# Patient Record
Sex: Male | Born: 1945
Health system: Southern US, Community
[De-identification: ages and names within clinical notes are randomized; demographics above are authoritative.]

## PROBLEM LIST (undated history)

## (undated) DIAGNOSIS — K635 Polyp of colon: Secondary | ICD-10-CM

## (undated) DIAGNOSIS — Z972 Presence of dental prosthetic device (complete) (partial): Secondary | ICD-10-CM

## (undated) DIAGNOSIS — E785 Hyperlipidemia, unspecified: Secondary | ICD-10-CM

## (undated) DIAGNOSIS — F32A Depression, unspecified: Secondary | ICD-10-CM

## (undated) DIAGNOSIS — J449 Chronic obstructive pulmonary disease, unspecified: Secondary | ICD-10-CM

## (undated) DIAGNOSIS — Z973 Presence of spectacles and contact lenses: Secondary | ICD-10-CM

## (undated) DIAGNOSIS — I214 Non-ST elevation (NSTEMI) myocardial infarction: Secondary | ICD-10-CM

## (undated) DIAGNOSIS — R51 Headache: Secondary | ICD-10-CM

## (undated) DIAGNOSIS — K08109 Complete loss of teeth, unspecified cause, unspecified class: Secondary | ICD-10-CM

## (undated) DIAGNOSIS — F329 Major depressive disorder, single episode, unspecified: Secondary | ICD-10-CM

## (undated) DIAGNOSIS — I2510 Atherosclerotic heart disease of native coronary artery without angina pectoris: Secondary | ICD-10-CM

## (undated) DIAGNOSIS — I639 Cerebral infarction, unspecified: Secondary | ICD-10-CM

## (undated) HISTORY — DX: Cerebral infarction, unspecified: I63.9

## (undated) HISTORY — DX: Depression, unspecified: F32.A

## (undated) HISTORY — PX: TONSILLECTOMY: SUR1361

## (undated) HISTORY — PX: APPENDECTOMY: SHX54

## (undated) HISTORY — DX: Hyperlipidemia, unspecified: E78.5

## (undated) HISTORY — PX: LUMBAR FUSION: SHX111

## (undated) HISTORY — DX: Chronic obstructive pulmonary disease, unspecified: J44.9

## (undated) HISTORY — PX: NASAL FRACTURE SURGERY: SHX718

## (undated) HISTORY — DX: Major depressive disorder, single episode, unspecified: F32.9

## (undated) HISTORY — PX: RIGHT COLECTOMY: SHX853

## (undated) HISTORY — PX: INGUINAL HERNIA REPAIR: SUR1180

## (undated) HISTORY — DX: Polyp of colon: K63.5

---

## 1984-09-23 HISTORY — PX: BACK SURGERY: SHX140

## 1996-09-23 HISTORY — PX: CORONARY STENT PLACEMENT: SHX1402

## 1998-12-21 ENCOUNTER — Encounter: Payer: Self-pay | Admitting: Emergency Medicine

## 1998-12-21 ENCOUNTER — Emergency Department (HOSPITAL_COMMUNITY): Admission: EM | Admit: 1998-12-21 | Discharge: 1998-12-21 | Payer: Self-pay | Admitting: Unknown Physician Specialty

## 2001-06-04 ENCOUNTER — Encounter: Payer: Self-pay | Admitting: Cardiovascular Disease

## 2001-06-04 ENCOUNTER — Ambulatory Visit (HOSPITAL_COMMUNITY): Admission: RE | Admit: 2001-06-04 | Discharge: 2001-06-04 | Payer: Self-pay | Admitting: Cardiovascular Disease

## 2001-06-11 ENCOUNTER — Encounter: Payer: Self-pay | Admitting: Neurosurgery

## 2001-06-11 ENCOUNTER — Encounter: Admission: RE | Admit: 2001-06-11 | Discharge: 2001-06-11 | Payer: Self-pay | Admitting: Neurosurgery

## 2001-07-13 ENCOUNTER — Encounter: Payer: Self-pay | Admitting: Neurosurgery

## 2001-07-16 ENCOUNTER — Inpatient Hospital Stay (HOSPITAL_COMMUNITY): Admission: RE | Admit: 2001-07-16 | Discharge: 2001-07-21 | Payer: Self-pay | Admitting: Neurosurgery

## 2001-07-16 ENCOUNTER — Encounter: Payer: Self-pay | Admitting: Neurosurgery

## 2001-10-05 ENCOUNTER — Encounter: Admission: RE | Admit: 2001-10-05 | Discharge: 2001-10-05 | Payer: Self-pay | Admitting: Neurosurgery

## 2001-10-05 ENCOUNTER — Encounter: Payer: Self-pay | Admitting: Neurosurgery

## 2002-01-11 ENCOUNTER — Encounter: Admission: RE | Admit: 2002-01-11 | Discharge: 2002-01-11 | Payer: Self-pay | Admitting: Neurosurgery

## 2002-01-11 ENCOUNTER — Encounter: Payer: Self-pay | Admitting: Neurosurgery

## 2002-10-12 ENCOUNTER — Emergency Department (HOSPITAL_COMMUNITY): Admission: EM | Admit: 2002-10-12 | Discharge: 2002-10-12 | Payer: Self-pay

## 2003-01-05 ENCOUNTER — Encounter: Admission: RE | Admit: 2003-01-05 | Discharge: 2003-01-05 | Payer: Self-pay | Admitting: Orthopedic Surgery

## 2003-01-05 ENCOUNTER — Encounter: Payer: Self-pay | Admitting: Orthopedic Surgery

## 2003-01-06 ENCOUNTER — Ambulatory Visit (HOSPITAL_BASED_OUTPATIENT_CLINIC_OR_DEPARTMENT_OTHER): Admission: RE | Admit: 2003-01-06 | Discharge: 2003-01-06 | Payer: Self-pay | Admitting: Orthopedic Surgery

## 2005-02-21 HISTORY — PX: THROMBECTOMY: PRO61

## 2005-02-21 HISTORY — PX: CORONARY STENT PLACEMENT: SHX1402

## 2005-02-28 ENCOUNTER — Inpatient Hospital Stay (HOSPITAL_COMMUNITY): Admission: EM | Admit: 2005-02-28 | Discharge: 2005-03-05 | Payer: Self-pay | Admitting: Emergency Medicine

## 2005-03-20 ENCOUNTER — Inpatient Hospital Stay (HOSPITAL_COMMUNITY): Admission: EM | Admit: 2005-03-20 | Discharge: 2005-03-22 | Payer: Self-pay | Admitting: Emergency Medicine

## 2005-05-21 ENCOUNTER — Ambulatory Visit: Payer: Self-pay | Admitting: Cardiovascular Disease

## 2005-10-26 ENCOUNTER — Emergency Department (HOSPITAL_COMMUNITY): Admission: EM | Admit: 2005-10-26 | Discharge: 2005-10-26 | Payer: Self-pay | Admitting: Emergency Medicine

## 2008-06-09 ENCOUNTER — Encounter: Payer: Self-pay | Admitting: Internal Medicine

## 2008-07-15 ENCOUNTER — Encounter: Admission: RE | Admit: 2008-07-15 | Discharge: 2008-07-15 | Payer: Self-pay | Admitting: Family Medicine

## 2008-07-25 ENCOUNTER — Encounter: Payer: Self-pay | Admitting: Internal Medicine

## 2008-07-25 DIAGNOSIS — Z860100 Personal history of colon polyps, unspecified: Secondary | ICD-10-CM | POA: Insufficient documentation

## 2008-12-30 ENCOUNTER — Encounter: Payer: Self-pay | Admitting: Internal Medicine

## 2008-12-30 LAB — CONVERTED CEMR LAB
ALT: 18 units/L
Creatinine, Ser: 1 mg/dL
Glucose, Bld: 111 mg/dL
Total Bilirubin: 79 mg/dL
Total Protein: 6.2 g/dL

## 2009-01-02 ENCOUNTER — Encounter: Payer: Self-pay | Admitting: Internal Medicine

## 2009-01-02 LAB — CONVERTED CEMR LAB
HCT: 46 %
Hemoglobin: 15.6 g/dL
RBC count: 4.99 10*6/uL
WBC, blood: 6.3 10*3/uL
WBC, blood: 6.3 10*3/uL
platelet count: 237 10*3/uL

## 2009-01-03 ENCOUNTER — Ambulatory Visit (HOSPITAL_COMMUNITY): Admission: RE | Admit: 2009-01-03 | Discharge: 2009-01-03 | Payer: Self-pay | Admitting: Family Medicine

## 2009-01-03 ENCOUNTER — Encounter: Payer: Self-pay | Admitting: Internal Medicine

## 2009-01-11 ENCOUNTER — Encounter: Payer: Self-pay | Admitting: Internal Medicine

## 2009-01-11 ENCOUNTER — Encounter: Admission: RE | Admit: 2009-01-11 | Discharge: 2009-01-11 | Payer: Self-pay | Admitting: Family Medicine

## 2009-01-19 ENCOUNTER — Encounter: Payer: Self-pay | Admitting: Internal Medicine

## 2009-01-24 ENCOUNTER — Encounter: Payer: Self-pay | Admitting: Internal Medicine

## 2009-01-26 ENCOUNTER — Encounter: Payer: Self-pay | Admitting: Internal Medicine

## 2009-07-04 ENCOUNTER — Encounter: Payer: Self-pay | Admitting: Internal Medicine

## 2009-11-01 ENCOUNTER — Encounter (INDEPENDENT_AMBULATORY_CARE_PROVIDER_SITE_OTHER): Payer: Self-pay | Admitting: *Deleted

## 2009-12-05 ENCOUNTER — Ambulatory Visit: Payer: Self-pay | Admitting: Internal Medicine

## 2009-12-05 DIAGNOSIS — F17200 Nicotine dependence, unspecified, uncomplicated: Secondary | ICD-10-CM

## 2009-12-05 DIAGNOSIS — Z8679 Personal history of other diseases of the circulatory system: Secondary | ICD-10-CM | POA: Insufficient documentation

## 2009-12-05 DIAGNOSIS — I252 Old myocardial infarction: Secondary | ICD-10-CM | POA: Insufficient documentation

## 2009-12-05 DIAGNOSIS — F419 Anxiety disorder, unspecified: Secondary | ICD-10-CM | POA: Insufficient documentation

## 2009-12-05 DIAGNOSIS — F329 Major depressive disorder, single episode, unspecified: Secondary | ICD-10-CM

## 2009-12-05 DIAGNOSIS — J449 Chronic obstructive pulmonary disease, unspecified: Secondary | ICD-10-CM

## 2009-12-05 DIAGNOSIS — I2510 Atherosclerotic heart disease of native coronary artery without angina pectoris: Secondary | ICD-10-CM

## 2009-12-05 LAB — HM COLONOSCOPY: HM Colonoscopy: ABNORMAL

## 2009-12-13 ENCOUNTER — Encounter: Payer: Self-pay | Admitting: Internal Medicine

## 2009-12-20 ENCOUNTER — Encounter (INDEPENDENT_AMBULATORY_CARE_PROVIDER_SITE_OTHER): Payer: Self-pay | Admitting: *Deleted

## 2009-12-26 ENCOUNTER — Ambulatory Visit: Payer: Self-pay | Admitting: Internal Medicine

## 2009-12-26 DIAGNOSIS — E785 Hyperlipidemia, unspecified: Secondary | ICD-10-CM | POA: Insufficient documentation

## 2009-12-28 LAB — CONVERTED CEMR LAB
ALT: 23 units/L (ref 0–53)
AST: 24 units/L (ref 0–37)
Calcium: 9 mg/dL (ref 8.4–10.5)
Cholesterol: 104 mg/dL (ref 0–200)
Eosinophils Relative: 4.7 % (ref 0.0–5.0)
Hemoglobin: 15.2 g/dL (ref 13.0–17.0)
LDL Cholesterol: 53 mg/dL (ref 0–99)
Lymphocytes Relative: 33.3 % (ref 12.0–46.0)
Lymphs Abs: 1.3 10*3/uL (ref 0.7–4.0)
Monocytes Relative: 6 % (ref 3.0–12.0)
Neutrophils Relative %: 55.7 % (ref 43.0–77.0)
PSA: 0.95 ng/mL (ref 0.10–4.00)
Platelets: 209 10*3/uL (ref 150.0–400.0)
Potassium: 4.9 meq/L (ref 3.5–5.1)
RBC: 4.65 M/uL (ref 4.22–5.81)
RDW: 13.9 % (ref 11.5–14.6)
Total CHOL/HDL Ratio: 3
Triglycerides: 62 mg/dL (ref 0.0–149.0)
VLDL: 12.4 mg/dL (ref 0.0–40.0)
WBC: 3.9 10*3/uL — ABNORMAL LOW (ref 4.5–10.5)

## 2010-02-08 ENCOUNTER — Encounter (INDEPENDENT_AMBULATORY_CARE_PROVIDER_SITE_OTHER): Payer: Self-pay | Admitting: *Deleted

## 2010-04-23 ENCOUNTER — Ambulatory Visit: Payer: Self-pay | Admitting: Cardiovascular Disease

## 2010-04-23 ENCOUNTER — Inpatient Hospital Stay (HOSPITAL_COMMUNITY): Admission: EM | Admit: 2010-04-23 | Discharge: 2010-04-25 | Payer: Self-pay | Admitting: Emergency Medicine

## 2010-04-23 HISTORY — PX: BALLOON ANGIOPLASTY, ARTERY: SHX564

## 2010-04-24 ENCOUNTER — Encounter: Payer: Self-pay | Admitting: Cardiovascular Disease

## 2010-04-27 ENCOUNTER — Telehealth (INDEPENDENT_AMBULATORY_CARE_PROVIDER_SITE_OTHER): Payer: Self-pay | Admitting: *Deleted

## 2010-05-02 ENCOUNTER — Encounter: Payer: Self-pay | Admitting: Cardiovascular Disease

## 2010-05-07 ENCOUNTER — Ambulatory Visit: Payer: Self-pay | Admitting: Internal Medicine

## 2010-05-07 DIAGNOSIS — M549 Dorsalgia, unspecified: Secondary | ICD-10-CM

## 2010-05-07 DIAGNOSIS — G8929 Other chronic pain: Secondary | ICD-10-CM

## 2010-05-08 ENCOUNTER — Telehealth: Payer: Self-pay | Admitting: Internal Medicine

## 2010-05-08 ENCOUNTER — Ambulatory Visit: Payer: Self-pay | Admitting: Internal Medicine

## 2010-05-09 ENCOUNTER — Ambulatory Visit: Payer: Self-pay | Admitting: Internal Medicine

## 2010-06-05 ENCOUNTER — Ambulatory Visit: Payer: Self-pay | Admitting: Cardiovascular Disease

## 2010-06-05 ENCOUNTER — Telehealth: Payer: Self-pay | Admitting: Internal Medicine

## 2010-06-11 ENCOUNTER — Encounter
Admission: RE | Admit: 2010-06-11 | Discharge: 2010-09-09 | Payer: Self-pay | Source: Home / Self Care | Attending: Physical Medicine & Rehabilitation | Admitting: Physical Medicine & Rehabilitation

## 2010-06-15 ENCOUNTER — Encounter: Payer: Self-pay | Admitting: Internal Medicine

## 2010-06-27 ENCOUNTER — Ambulatory Visit: Payer: Self-pay | Admitting: Internal Medicine

## 2010-07-02 LAB — CONVERTED CEMR LAB: Hgb A1c MFr Bld: 5.9 % (ref 4.6–6.5)

## 2010-07-16 ENCOUNTER — Ambulatory Visit: Payer: Self-pay | Admitting: Physical Medicine & Rehabilitation

## 2010-07-16 ENCOUNTER — Encounter: Payer: Self-pay | Admitting: Cardiovascular Disease

## 2010-07-25 ENCOUNTER — Telehealth: Payer: Self-pay | Admitting: Cardiovascular Disease

## 2010-08-30 ENCOUNTER — Ambulatory Visit: Payer: Self-pay | Admitting: Physical Medicine & Rehabilitation

## 2010-09-26 ENCOUNTER — Ambulatory Visit
Admission: RE | Admit: 2010-09-26 | Discharge: 2010-09-26 | Payer: Self-pay | Source: Home / Self Care | Attending: Internal Medicine | Admitting: Internal Medicine

## 2010-09-27 ENCOUNTER — Encounter
Admission: RE | Admit: 2010-09-27 | Discharge: 2010-10-02 | Payer: Self-pay | Source: Home / Self Care | Attending: Physical Medicine & Rehabilitation | Admitting: Physical Medicine & Rehabilitation

## 2010-10-02 ENCOUNTER — Ambulatory Visit
Admission: RE | Admit: 2010-10-02 | Discharge: 2010-10-02 | Payer: Self-pay | Source: Home / Self Care | Attending: Physical Medicine & Rehabilitation | Admitting: Physical Medicine & Rehabilitation

## 2010-10-17 ENCOUNTER — Telehealth: Payer: Self-pay | Admitting: Internal Medicine

## 2010-10-21 LAB — CONVERTED CEMR LAB
AST: 24 units/L
HDL: 43 mg/dL
LDL Cholesterol: 80 mg/dL
Triglyceride fasting, serum: 130 mg/dL

## 2010-10-23 NOTE — Assessment & Plan Note (Signed)
Summary: 6 month ov//ph   Vital Signs:  Patient profile:   65 year old male Weight:      209.38 pounds Pulse rate:   77 / minute Pulse rhythm:   regular BP sitting:   134 / 86  (left arm) Cuff size:   large  Vitals Entered By: Army Fossa CMA (June 27, 2010 2:14 PM) CC: 6 month f/u Comments Pain in legs, back and neck. See pain clinic on the 24th [facility]   History of Present Illness: ROV feels well saw cards, note reviewed   Current Medications (verified): 1)  Symbicort 160-4.5 Mcg/act Aero (Budesonide-Formoterol Fumarate) .... Daily 2)  Sertraline Hcl 50 Mg Tabs (Sertraline Hcl) .Marland Kitchen.. 1 By Mouth Once Daily 3)  Bayer Aspirin 325 Mg Tabs (Aspirin) .Marland Kitchen.. 1 By Mouth Once Daily 4)  Multivitamins   Tabs (Multiple Vitamin) .Marland Kitchen.. 1 Tab Once Daily 5)  Trazodone Hcl 150 Mg Tabs (Trazodone Hcl) .... 2 By Mouth At Bedtime 6)  Plavix 75 Mg Tabs (Clopidogrel Bisulfate) .Marland Kitchen.. 1 By Mouth Daily. 7)  Metoprolol Tartrate 25 Mg Tabs (Metoprolol Tartrate) .... 1/2 Tab Two Times A Day 8)  Nitrostat 0.4 Mg Subl (Nitroglycerin) .Marland Kitchen.. 1 Tablet Under Tongue At Onset of Chest Pain; You May Repeat Every 5 Minutes For Up To 3 Doses. 9)  Crestor 40 Mg Tabs (Rosuvastatin Calcium) .Marland Kitchen.. 1 By Mouth Daily 10)  Lisinopril 5 Mg Tabs (Lisinopril) .... Take One Tablet By Mouth Daily  Allergies (verified): 1)  ! Tylenol 2)  ! Codeine 3)  ! Vicodin 4)  ! Tetracycline  Past History:  Past Medical History: 1. Depression 2. Cerebrovascular accident, hx of (x2)  one in 2000 and one in 2001.The latter stroke was reportedly due to emboli from a mitral valve vegetation.  3. CAD-stent  placement in the LAD in 1998. February 28, 2005 : acute non ST segment elevation myocardial infarction. He subsequently underwent thrombectomy and stenting of the circumflex. He was discharged on June 13, 200  March 21 2005- readmited, had a  PCI/drug-eluting stent implantation mid-LAD. AMI 8-11, stent with in stent  restenosis Circumflex, sp PTCA balloon angioplasty  4. COPD, mild dz per PFTs  4-10 5. Hyperlipidemia  Past Surgical History: Reviewed history from 05/07/2010 and no changes required. 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 Resp:  Denies cough, coughing up blood, and shortness of breath; no DOE.  Physical Exam  General:  alert and well-developed.   Lungs:  normal respiratory effort, no intercostal retractions, no accessory muscle use, and normal breath sounds.   Heart:  normal rate, regular rhythm, no murmur, and no gallop.   Extremities:  no pretibial edema bilaterally    Impression & Recommendations:  Problem # 1:  HYPERLIPIDEMIA (ICD-272.4) stable per FLP @ the hospital samples   His updated medication list for this problem includes:    Crestor 40 Mg Tabs (Rosuvastatin calcium) .Marland Kitchen... 1 by mouth daily  Problem #  2:  ? of DIABETES MELLITUS (ICD-250.00) A1C 5.7 on  8-11 (@ the hospital) labs  His updated medication list for this problem includes:    Bayer Aspirin 325 Mg Tabs (Aspirin) .Marland Kitchen... 1 by mouth once daily    Lisinopril 5 Mg Tabs (Lisinopril) .Marland Kitchen... Take one tablet by mouth daily  Labs Reviewed: Creat: 1.1 (12/26/2009)    Reviewed HgBA1c results: 5.2 (07/04/2009)  Orders: Venipuncture (30160) TLB-A1C / Hgb A1C (Glycohemoglobin) (83036-A1C) Specimen Handling (10932)  Problem # 3:  TOBACCO ABUSE (ICD-305.1) counsled! risk of MI discussed encouraged to go to the free seminar  at [facility]  Problem # 4:  CORONARY ARTERY DISEASE (ICD-414.00) samples of plavix  His updated medication list for this problem includes:    Bayer Aspirin 325 Mg Tabs (Aspirin) .Marland Kitchen... 1 by mouth once daily    Plavix 75 Mg Tabs (Clopidogrel bisulfate) .Marland Kitchen... 1 by mouth daily.    Metoprolol Tartrate 25 Mg Tabs (Metoprolol tartrate) .Marland Kitchen... 1/2 tab two times a day    Nitrostat 0.4 Mg Subl (Nitroglycerin) .Marland Kitchen... 1 tablet under tongue at onset of chest pain; you may repeat every 5 minutes for up to 3 doses.    Lisinopril 5 Mg Tabs (Lisinopril) .Marland Kitchen... Take one tablet by mouth daily  Complete Medication List: 1)  Symbicort 160-4.5 Mcg/act Aero (Budesonide-formoterol fumarate) .... Daily 2)  Sertraline Hcl 50 Mg Tabs (Sertraline hcl) .Marland Kitchen.. 1 by mouth once daily 3)  Bayer Aspirin 325 Mg Tabs (Aspirin) .Marland Kitchen.. 1 by mouth once daily 4)  Multivitamins Tabs (Multiple vitamin) .Marland Kitchen.. 1 tab once daily 5)  Trazodone Hcl 150 Mg Tabs (Trazodone hcl) .... 2 by mouth at bedtime 6)  Plavix 75 Mg Tabs (Clopidogrel bisulfate) .Marland Kitchen.. 1 by mouth daily. 7)  Metoprolol Tartrate 25 Mg Tabs (Metoprolol tartrate) .... 1/2 tab two times a day 8)  Nitrostat 0.4 Mg Subl (Nitroglycerin) .Marland Kitchen.. 1 tablet under tongue at onset of chest pain; you may repeat every 5 minutes for up to 3 doses. 9)  Crestor 40 Mg Tabs (Rosuvastatin calcium) .Marland Kitchen.. 1 by mouth daily 10)  Lisinopril 5 Mg Tabs (Lisinopril) .... Take one tablet by mouth daily  Other Orders: Flu Vaccine 56yrs + MEDICARE PATIENTS (T5573) Administration Flu vaccine - MCR (U2025)  Patient Instructions: 1)  Please schedule a follow-up appointment in 3  months . Fasting  Flu Vaccine Consent Questions     Do you have a history of severe allergic reactions to this vaccine? no    Any prior history of allergic reactions to egg and/or gelatin? no    Do you have a sensitivity to the preservative Thimersol? no    Do you have a past history of Guillan-Barre Syndrome? no    Do you currently have an  acute febrile illness? no    Have you ever had a severe reaction to latex? no    Vaccine information given and explained to patient? yes    Are you currently pregnant? no    Lot Number:[id number]   Exp Date:03/23/2011   Site Given  Right Deltoid IM     [email or web address]

## 2010-10-23 NOTE — Assessment & Plan Note (Signed)
Summary: NEW TO EST,BCBS/RH......[name]   Vital Signs:  Patient profile:   65 year old male Height:      72 inches Weight:      213.4 pounds BMI:     29.05 Pulse rate:   80 / minute BP sitting:   120 / 80  Vitals Entered By: Shary Decamp (December 05, 2009 1:32 PM) CC: new pt to establish Is Patient Diabetic? No   History of Present Illness: new patient CAD-- has not seen cards in a while, asx  extensive records from the hospital reviewed, see past medical history DEPRESSION-- doing well, on meds     Preventive Screening-Counseling & Management  Alcohol-Tobacco     Smoking Status: current     Packs/Day: 0.5  Caffeine-Diet-Exercise     Caffeine use/day: 0     Does Patient Exercise: yes     Times/week: 3      Drug Use:  no.    Current Medications (verified): 1)  Simvastatin 80 Mg Tabs (Simvastatin) .[name].. 1 By Mouth Once Daily 2)  Symbicort 160-4.5 Mcg/act Aero (Budesonide-Formoterol Fumarate) .... Daily 3)  Sertraline Hcl 50 Mg Tabs (Sertraline Hcl) .[name].. 1 By Mouth Once Daily 4)  Bayer Aspirin 325 Mg Tabs (Aspirin) .[name].. 1 By Mouth Once Daily 5)  Mvi 6)  Trazodone Hcl 150 Mg Tabs (Trazodone Hcl) .[name].. 1 By Mouth At Bedtime  Allergies (verified): 1)  ! Tylenol 2)  ! Codeine 3)  ! Vicodin 4)  ! 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  coronary artery disease stent  placement in the LAD in 1998. February 28, 2005 : acute non ST segment elevation myocardial infarction. He subsequently underwent thrombectomy and stenting of the circumflex. He was discharged on June 13, 200  March 21 2005- readmited, had a  PCI/drug-eluting stent implantation mid-LAD.    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 Resp:  Denies cough, coughing up blood, and shortness of breath; occasionally wheezing, uses symbicort as needed . GI:  Denies diarrhea, nausea, and vomiting. GU:  Denies dysuria and hematuria.  Physical Exam  General:  alert and well-developed.   Lungs:  normal respiratory effort, no intercostal retractions, and no accessory muscle use.  few rhonchi Heart:  normal rate, regular rhythm, no murmur, and no gallop.   Abdomen:  soft, non-tender, and no distention.   Extremities:  no edema Psych:  Oriented X3, memory intact for recent and remote, normally interactive, good eye contact, not anxious appearing, and not depressed appearing.     Impression & Recommendations:  Problem # 1:  COPD (ICD-496) diagnoses is done today based on long history of tobacco abuse, occ. wheezing and a  CT  of the chest in 2006 (" early COPD") counseled  about tobacco   His updated medication list for this problem includes:    Symbicort 160-4.5 Mcg/act Aero (Budesonide-formoterol fumarate) .[name]... Daily  Problem # 2:  TOBACCO ABUSE (ICD-305.1)  counseled  risks of tobacco abuse discussed including strokes and CAD options also discussed: ?medications, printed material provided about seminar to help him quit  Orders: Tobacco use cessation intermediate 3-10 minutes (99406)  Problem # 3:  CORONARY ARTERY DISEASE (ICD-414.00) asx,  has not seen cardiology in a while  His updated medication list for this problem  includes:    Bayer Aspirin 325 Mg Tabs (Aspirin) .[name]... 1 by mouth once daily  Problem # 4:  DEPRESSION (ICD-311) stable at present  His updated medication list for this problem includes:    Sertraline Hcl 50 Mg Tabs (Sertraline hcl) .[name]... 1 by mouth once daily    Trazodone Hcl 150 Mg Tabs (Trazodone hcl) .[name]... 1 by mouth at bedtime  Problem # 5:  time spent today more than 25 minutes due to extensive electronic chart review from the hospital.  Will also get records from his previous PCP.  He had his yearly every April, he will come back in April for his first yearly checkup here  Complete Medication List: 1)  Simvastatin 80 Mg Tabs (Simvastatin) .[name].. 1 by mouth once daily 2)  Symbicort 160-4.5 Mcg/act Aero (Budesonide-formoterol fumarate) .... Daily 3)  Sertraline Hcl 50 Mg Tabs (Sertraline hcl) .[name].. 1 by mouth once daily 4)  Bayer Aspirin 325 Mg Tabs (Aspirin) .[name].. 1 by mouth once daily 5)  Mvi  6)  Trazodone Hcl 150 Mg Tabs (Trazodone hcl) .[name].. 1 by mouth at bedtime  Patient Instructions: 1)  came back in April for your physical   Preventive Care Screening  Colonoscopy:    Date:  10/24/2008    Next Due:  10/2010    Results:  polyps    Last Tetanus Booster:    Date:  09/23/2006    Results:  Td     Risk Factors:  Tobacco use:  current    Cigarettes:  Yes -- 0.5 pack(s) per day Drug use:  no Caffeine use:  0 drinks per day Alcohol use:  no Exercise:  yes    Times per week:  3  Colonoscopy History:     Date of Last Colonoscopy:  10/24/2008    Results:  polyps      Past Medical History:    Depression        Cerebrovascular accident, hx of (x2)  one in 2000 and one in 2001.The latter stroke was reportedly due to emboli from a mitral valve vegetation.        coronary artery disease    stent  placement in the LAD in 1998.    February 28, 2005 : acute non ST segment elevation myocardial infarction. He subsequently underwent thrombectomy and stenting of the circumflex.  He was discharged on June 13, 200     March 21 2005- readmited, had a  PCI/drug-eluting stent implantation mid-LAD.          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

## 2010-10-23 NOTE — Letter (Signed)
Summary: [facility] Medical Clearance   [facility]   Imported By: Roderic Ovens 08/03/2010 11:04:53  _____________________________________________________________________  External Attachment:    Type:   Image     Comment:   External Document

## 2010-10-23 NOTE — Progress Notes (Signed)
Summary: re pain med inj being cxl/**LM/nm  Phone Note Call from Patient   Caller: Patient [phone] Reason for Call: Talk to Nurse Summary of Call: pt calling to ask why dr Sanjuana Kava cxll his pain med inj Initial call taken by: Glynda Jaeger,  July 25, 2010 4:57 PM  Follow-up for Phone Call        Pt. wants to have back injections for his severe back pain & needs to be off of Plavix 1 week prior to the procedure. He was just cathed in August with severe in-stent restenosis and was advised to continue Plavix. Pt. has already stopped taking his Plavix when the injection is not sch. until 11/17 and so I advised him to continue taking it for now. I told him it was highly unlikely that he will be cleared to be off of Plavix and the risk of having another MI. He stated that he doesn't care b/c he is in severe pain and needs the procedure done no matter what..? I will forward to Dr. Clifton James for his opinion. Whitney Maeola Sarah RN  July 25, 2010 5:39 PM  Follow-up by: Whitney Maeola Sarah RN,  July 25, 2010 5:39 PM  Additional Follow-up for Phone Call Additional follow up Details #1::        I would not advise him to stop his Plavix as he had a STEMI three months ago with stent thrombosis. If he chooses to stop the Plavix against medical advise, he can do so with the risk of repeat stent thrombosis. cdm Additional Follow-up by: Verne Carrow, MD,  July 27, 2010 3:04 PM    Additional Follow-up for Phone Call Additional follow up Details #2::    LMTCB. Ollen Gross, RN, BSN  July 27, 2010 3:19 PM   Additional Follow-up for Phone Call Additional follow up Details #3:: Details for Additional Follow-up Action Taken: Can we try to call him back to explain why he should remain on Plavix. thanks, cdm LMTCB Ricardo Red RN I spoke with [name] about the risks involved should he stop the plavix now.  He understands it would be against medical advice.  He said the epidural  injections last about a year.  He will talk with his orthopaedic doctor about alternative treatment. Ricardo Red RN Additional Follow-up by: Verne Carrow, MD,  July 30, 2010 11:36 AM

## 2010-10-23 NOTE — Progress Notes (Signed)
Summary: referral  Phone Note Call from Patient Call back at Home Phone [phone]   Summary of Call: Pt called and would like a call back regarding his referral, states he has not heard anything regarding an appt. Army Fossa CMA  June 05, 2010 12:57 PM   Follow-up for Phone Call        Per referral to [facility] (closest [facility] provider), they Dr. Flo Shanks declined has declined to see the patient per fax, states that the patient need Pain Management.  Will Dr. Drue Novel agree to enter a Pain Mgmt Referral? Magdalen Spatz [facility]  June 06, 2010 9:58 AM  options are -see a local pain management physician -refer him to neurosurgery  at [facility], Kateri Mc, [facility]. Paz MD  June 06, 2010 12:16 PM  Follow-up by:    Additional Follow-up for Phone Call Additional follow up Details #1::        Left message for pt to call back. Army Fossa CMA  June 06, 2010 12:51 PM     Additional Follow-up for Phone Call Additional follow up Details #2::    I spoke with pt and he would prefer not to go to [place], [place], or [place]. Agrees to see Pain Management.  Follow-up by: Army Fossa CMA,  June 07, 2010 11:44 AM

## 2010-10-23 NOTE — Letter (Signed)
Summary: Records Dated 10-04-08 thru 07-04-09/[facility]  Records Dated 10-04-08 thru 07-04-09/[facility]   Imported By: Lanelle Bal 02/05/2010 11:36:40  _____________________________________________________________________  External Attachment:    Type:   Image     Comment:   External Document  Appended Document: Records Dated 10-04-08 thru 07-04-09/[facility] records scanned for future reference

## 2010-10-23 NOTE — Miscellaneous (Signed)
Summary: OLD RECORDS  Clinical Lists Changes  Observations: Added new observation of SGPT (ALT): 27 units/L (07/04/2009 9:39) Added new observation of SGOT (AST): 24 units/L (07/04/2009 9:39) Added new observation of HGBA1C: 5.2 % (07/04/2009 9:39) Added new observation of TRIGLYCERIDE: 130 mg/dL (16/06/9603 5:40) Added new observation of HDL: 43 mg/dL (98/07/9146 8:29) Added new observation of LDL: 80 mg/dL (56/21/3086 5:78) Added new observation of CHOLESTEROL: 130 mg/dL (46/96/2952 8:41) Added new observation of ETTECHOFIND: No exercise induced regional wall motion abnormalities. All cardiac segments show normal hyperkinesia post exercise.   "study is normal.  No evidence of worsening blockage.  Squeezing fxn normal.  Does not appear tha tSOB is coming from heart.  Needs lung eval." [facility], Dr. Amil Amen  (01/24/2009 9:37) Added new observation of EKG INTERP: Sinus bradycardia, looks like inferior MI, no significant change from prior tracing [facility] cardiology, Dr. Amil Amen  (01/19/2009 9:40) Added new observation of ECHOINTERP: Mild MR, focal akinetic basal inferior wall, nl global EF [facility], Dr Amil Amen     (01/19/2009 9:36) Added new observation of RESULTS MISC: Type of Report:  spirometry, mild airflow limitation without significant respose to bronchodilator - FINAL REPORT READ BY DR Delton Coombes  (01/03/2009 9:02) Added new observation of PLATELET CNT: 237 10*3/microliter (01/02/2009 9:09) Added new observation of HCT: 46.0 % (01/02/2009 9:09) Added new observation of HGB: 15.6 g/dL (32/44/0102 7:25) Added new observation of RBC: 4.99  10*6/mm3 (01/02/2009 9:09) Added new observation of WBC: 6.3 10*3/mm3 (01/02/2009 9:09) Added new observation of PLATELET CNT: 237 10*3/microliter (01/02/2009 9:09) Added new observation of HCT: 46.0 % (01/02/2009 9:09) Added new observation of HGB: 15.6 g/dL (36/64/4034 7:42) Added new observation of RBC: 4.99  10*6/mm3 (01/02/2009  9:09) Added new observation of WBC: 6.3 10*3/mm3 (01/02/2009 9:09) Added new observation of PROTEIN, TOT: 6.2 g/dL (59/56/3875 6:43) Added new observation of ALBUMIN: 0.7 g/dL (32/95/1884 1:66) Added new observation of BILI TOTAL: 79 mg/dL (03/22/1600 0:93) Added new observation of ALK PHOS: 79 units/L (12/30/2008 9:08) Added new observation of SGPT (ALT): 18 units/L (12/30/2008 9:08) Added new observation of SGOT (AST): 22 units/L (12/30/2008 9:08) Added new observation of CALCIUM: 10.1 mg/dL (23/55/7322 0:25) Added new observation of BG RANDOM: 111 mg/dL (42/70/6237 6:28) Added new observation of CREATININE: 1.00 mg/dL (31/51/7616 0:73) Added new observation of BUN: 12 mg/dL (71/02/2693 8:54) Added new observation of CO2 TOTAL: 30 mmol/L (12/30/2008 9:08) Added new observation of CHLORIDE: 104 mmol/L (12/30/2008 9:08) Added new observation of SODIUM: 141 mmol/L (12/30/2008 9:08) Added new observation of COLONRECACT: Repeat colonoscopy in 3 years.     (07/25/2008 9:04) Added new observation of COLONOSCOPY: Results: Polyp.   - Three 2-50mm polyps in descending colon  - medium sized lipoma in sigmoid colon  - multiple polyps in recto-sigmoid colon   (07/25/2008 9:04)      Echocardiogram  Procedure date:  01/19/2009  Findings:      Mild MR, focal akinetic basal inferior wall, nl global EF [facility], Dr Amil Amen     Stress Echocardiogram  Procedure date:  01/24/2009  Findings:      No exercise induced regional wall motion abnormalities. All cardiac segments show normal hyperkinesia post exercise.   'study is normal.  No evidence of worsening blockage.  Squeezing fxn normal.  Does not appear tha tSOB is coming from heart.  Needs lung eval.' [facility], Dr. Amil Amen  EKG  Procedure date:  01/19/2009  Findings:      Sinus bradycardia, looks like inferior MI, no significant  change from prior tracing [facility] cardiology, Dr. Amil Amen  MISC.  Report  Procedure date:  01/03/2009  Findings:      Type of Report:  PFT  Breathing test normal.  No lung problem identified.  No reason seen for his SOB with exertion.   Dr. Manus Gunning   Colonoscopy  Procedure date:  07/25/2008  Findings:      Results: Polyp.   - Three 2-57mm polyps in descending colon  - medium sized lipoma in sigmoid colon  - multiple polyps in recto-sigmoid colon   Comments:      Repeat colonoscopy in 3 years.      Echocardiogram  Procedure date:  01/19/2009  Findings:      Mild MR, focal akinetic basal inferior wall, nl global EF [facility], Dr Amil Amen     Stress Echocardiogram  Procedure date:  01/24/2009  Findings:      No exercise induced regional wall motion abnormalities. All cardiac segments show normal hyperkinesia post exercise.   'study is normal.  No evidence of worsening blockage.  Squeezing fxn normal.  Does not appear tha tSOB is coming from heart.  Needs lung eval.' [facility], Dr. Amil Amen  EKG  Procedure date:  01/19/2009  Findings:      Sinus bradycardia, looks like inferior MI, no significant change from prior tracing [facility] cardiology, Dr. Amil Amen  MISC. Report  Procedure date:  01/03/2009  Findings:      Type of Report:  PFT  Breathing test normal.  No lung problem identified.  No reason seen for his SOB with exertion.   Dr. Manus Gunning   Colonoscopy  Procedure date:  07/25/2008  Findings:      Results: Polyp.   - Three 2-75mm polyps in descending colon  - medium sized lipoma in sigmoid colon  - multiple polyps in recto-sigmoid colon   Comments:      Repeat colonoscopy in 3 years.          Lipid Panel Test Date: 07/04/2009                        Value        Units        H/L   Reference  Cholesterol:          130          mg/dL              (161-096) LDL Cholesterol:      80           mg/dL              (04-540) HDL Cholesterol:      43           mg/dL               (98-11) Triglyceride:         130          mg/dL              (91-478) GNFA2Z                5.2                             SGOT (AST)            24  SGPT (ALT)            27                                   Chemistry Labs Test Date: 12/30/2008                      Value Units        H/L   Reference  Sodium:             141   mmol/L             (137-145) Chloride:           104   mmol/L             (101-111) CO2:                30    mmol/L             (22-31) BUN:                12    mg/dL              (1-61) Creatinine:         1.00  mg/dL              (0.9-6.0) Glucose-random:     111   mg/dL         H    (45-409) Calcium (total):    10.1  mg/dL              (8-11.9) SGOT:               22    U/L                (10-40) SGPT:               18    U/L                (10-40) Alkaline P'tase:    79    U/L                (10-120) T. Bili:            79    mg/dL         H    (1.4-7.8) Albumin:            0.7   g/dL          L    (3-5) Total Protein:      6.2   g/dL               (4-7)    Complete Blood Count Test Date: 01/02/2009             Value   Units      H/L    Reference  WBC:       6.3   X 10^3/uL          (3.5-10.0) RBC:       4.99  X 10^6/uL          (4.20-5.80) Hgb:       15.6  g/dl               (29.5-62.1) Hct:       46.0  %                  (  38.5-52.0) Platelets: 237   X 10^3/uL          (150-450)

## 2010-10-23 NOTE — Assessment & Plan Note (Signed)
Summary: HOSPITAL FOLLOWUP/PER DR. PAZ//KN   Vital Signs:  Patient profile:   65 year old male Weight:      211.38 pounds Pulse rate:   67 / minute Pulse rhythm:   regular BP sitting:   132 / 84  (left arm) Cuff size:   regular  Vitals Entered By: Army Fossa CMA (May 07, 2010 3:46 PM) CC: Hospital f/u   History of Present Illness: Hospital followup Chart reviewed DATE OF ADMISSION:  04/23/2010 DATE OF DISCHARGE:  04/25/2010  DISCHARGE DIAGNOSES: 1. Acute inferolateral ST-elevation myocardial infarction.     a.     Known history of coronary artery disease with previous      percutaneous coronary intervention with drug eluting stent to the      left anterior descending and circumflex, 2006.     b.     Acute inferolateral ST-elevation myocardial infarction with  subsequent cardiac catheterization, April 23, 2010:  Triple-vessel  coronary artery disease, severe in-stent restenosis in the drug      eluting stent of proximal to mid circumflex artery, 95% obstruction.  Segmental left ventricular systolic dysfunction, inferior wall hypokinesis and left ventricular ejection fraction 35-40%.     c.     Successful percutaneous transluminal coronary angioplasty  with balloon only in the area of severe in-stent restenosis of the   circumflex stent.     d.     2-D echocardiogram, April 24, 2010 revealing normal left  ventricle with normal systolic function, left ventricular ejection  fraction 50-55%,      e.     P2Y12% inhibition equal to 38, PRU 154, lifelong DAPT with  full strength aspirin and Plavix 75 mg p.o. daily.   . 2. Ongoing tobacco abuse.     a.     Planned cessation, follow up with primary care Derrel Moore.     Current Medications (verified): 1)  Symbicort 160-4.5 Mcg/act Aero (Budesonide-Formoterol Fumarate) .... Daily 2)  Sertraline Hcl 50 Mg Tabs (Sertraline Hcl) .Marland Kitchen.. 1 By Mouth Once Daily 3)  Bayer Aspirin 325 Mg Tabs (Aspirin) .Marland Kitchen.. 1 By Mouth Once Daily 4)  Mvi 5)   Trazodone Hcl 150 Mg Tabs (Trazodone Hcl) .... 2 By Mouth At Bedtime 6)  Plavix 75 Mg Tabs (Clopidogrel Bisulfate) .Marland Kitchen.. 1 By Mouth Daily. 7)  Metoprolol Tartrate 25 Mg Tabs (Metoprolol Tartrate) .... 1/2 Tab Two Times A Day 8)  Nitrostat 0.4 Mg Subl (Nitroglycerin) .... As Needed 9)  Crestor 40 Mg Tabs (Rosuvastatin Calcium) .Marland Kitchen.. 1 By Mouth Daily  Allergies: 1)  ! Tylenol 2)  ! Codeine 3)  ! Vicodin 4)  ! 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  coronary artery disease stent  placement in the LAD in 1998. February 28, 2005 : acute non ST segment elevation myocardial infarction. He subsequently underwent thrombectomy and stenting of the circumflex. He was discharged on June 13, 200  March 21 2005- readmited, had a  PCI/drug-eluting stent implantation mid-LAD. 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 Single (his girlfrien is Mrs.  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 CV:  denies chest pain or shortness of breath. Resp:  no cough or wheezing has discontinued tobacco since his heart attack, using over the counter  nicotine. Feels like he will be successful this time stating away from tobacco. GI:  no nausea, vomiting, diarrhea No blood in the stools. GU:  no dysuria or gross hematuria. MS:  complaining of   back pain for the last 3 months. Pain is worse by moving his torso, it radiates down to the left buttock and left leg, posterior aspect, all the way to the foot No fever No bladder or bowel incontinence Occasional tingling in the left leg.Marland Kitchen  Physical Exam  General:  alert, well-developed, and well-nourished.   Lungs:  normal respiratory effort, no intercostal retractions, no accessory muscle use, and normal breath sounds.   Heart:  normal rate, regular rhythm, no murmur, and no gallop.   Msk:  well healed surgical scar in the distal thoracic back and lumbar back. Slightly tender at the distal thoracic back Extremities:  no lower extremity edema Neurologic:  gait is moderately antalgic Lower extremities with normal reflexes and motor strength straight leg test is negative Psych:  not anxious appearing and not depressed appearing.     Impression & Recommendations:  Problem # 1:  CORONARY ARTERY DISEASE (ICD-414.00) status post acute MI, now asymptomatic He will remain on aspirin and Plavix for life per  discharge summary dictation His updated medication list for this problem includes:    Bayer Aspirin 325 Mg Tabs (Aspirin) .Marland Kitchen... 1 by mouth once daily    Plavix 75 Mg Tabs (Clopidogrel bisulfate) .Marland Kitchen... 1 by mouth daily.    Metoprolol Tartrate 25 Mg Tabs (Metoprolol tartrate) .Marland Kitchen... 1/2 tab two times a day    Nitrostat 0.4 Mg Subl (Nitroglycerin) .Marland Kitchen... As needed  Problem # 2:  HYPERLIPIDEMIA (ICD-272.4) well controlled Labs at the hospital as follows: Cholesterol 98, triglycerides 115, HDL 35, LDL 40 he is now on Crestor The following medications were removed from the medication list:    Simvastatin 80 Mg Tabs (Simvastatin) .Marland Kitchen... 1 by mouth once daily His updated medication list for this  problem includes:    Crestor 40 Mg Tabs (Rosuvastatin calcium) .Marland Kitchen... 1 by mouth daily  Problem # 3:  TOBACCO ABUSE (ICD-305.1) quit tobacco since the acute heart attack He thinks he is doing very well with over-the-counter nicotine supplements. Advised to call me immediately if problems. Discussed with him the need to stay away from tobacco to preserve his heart  Problem # 4:  BACK PAIN (ICD-724.5) back pain with radiculopathy for 3 months, history of multiple surgeries by [name] in the past. Plan X-ray Refer to [name] again His updated medication list for this problem includes:    Bayer Aspirin 325 Mg Tabs (Aspirin) .Marland Kitchen... 1 by mouth once daily  Orders: T-Lumbar Spine Complete, 5 Views [phone]) T-Thoracic Spine 2 Views [phone]) Misc. Referral (Misc. Ref)  Complete Medication List: 1)  Symbicort 160-4.5 Mcg/act Aero (Budesonide-formoterol fumarate) .... Daily 2)  Sertraline Hcl 50 Mg Tabs (Sertraline  hcl) .... 1 by mouth once daily 3)  Bayer Aspirin 325 Mg Tabs (Aspirin) .Marland Kitchen.. 1 by mouth once daily 4)  Mvi  5)  Trazodone Hcl 150 Mg Tabs (Trazodone hcl) .... 2 by mouth at bedtime 6)  Plavix 75 Mg Tabs (Clopidogrel bisulfate) .Marland Kitchen.. 1 by mouth daily. 7)  Metoprolol Tartrate 25 Mg Tabs (Metoprolol tartrate) .... 1/2 tab two times a day 8)  Nitrostat 0.4 Mg Subl (Nitroglycerin) .... As needed 9)  Crestor 40 Mg Tabs (Rosuvastatin calcium) .Marland Kitchen.. 1 by mouth daily  Patient Instructions: 1)  Please schedule a follow-up appointment in 3 months .

## 2010-10-23 NOTE — Miscellaneous (Signed)
Summary: [facility]iac Progress Note   [facility]   Imported By: Roderic Ovens 05/21/2010 12:48:22  _____________________________________________________________________  External Attachment:    Type:   Image     Comment:   External Document

## 2010-10-23 NOTE — Progress Notes (Signed)
Summary: NEUROSURGERY REFERRAL  Phone Note Outgoing Call Call back at [facility] Phone [phone]   Call placed by: Magdalen Spatz [facility],  May 08, 2010 4:31 PM Call placed to: Patient Summary of Call: IN REFERENCE TO NEUROSURGERY REFERRAL, THE PATIENT NOW HAS BLUE MEDICARE INSURANCE.  HIS PREVIOUS SURGEON DR. HIRSCH'S OFFICE DOESN'T ACCEPT IT.  THE ONLY OTHER LOCAL NEUROSURGEON IS [facility] [place].  THE PATIENT STATES HE HAS BEEN THERE BEFORE & DIDN'T LIKE THEM, SAYS THEY STATE NOTHING IS WRONG WITH HIM.  I THEN INFORMED PATIENT THE ONLY OTHER PROVIDERS TO REFER HIM TO WERE OUT OF TOWN & PATIENT STATED HE WILL NOT GO OUT OF TOWN.  PLEASE ADVISE. Initial call taken by: Magdalen Spatz [facility],  May 08, 2010 4:31 PM  Follow-up for Phone Call        my advise is: see Dr Phoebe Perch and pay out of network or  accept to see somebody out of town [facility]?) [place] E. Paz MD  May 09, 2010 9:19 AM   Additional Follow-up for Phone Call Additional follow up Details #1::        ([facility] IS NOT IN-NETWORK W/PT'S INSURANCE, CLOSEST IN-NETWORK IS [facility] [place].  PT CHOOSES TO GO HERE INSTEAD OF PAYING OUT OF NETWORK BENEFITS W/[facility]).  COMPLETED FORM & ALL INFO FAXED TO [facility], THEY WILL CONTACT PT, AWAITING APPT  Additional Follow-up by: Magdalen Spatz [facility]  May 10, 2010 2:35 PM

## 2010-10-23 NOTE — Letter (Signed)
Summary: Cardiac Rehab Program  Cardiac Rehab Program   Imported By: Marylou Mccoy 05/14/2010 15:40:27  _____________________________________________________________________  External Attachment:    Type:   Image     Comment:   External Document

## 2010-10-23 NOTE — Letter (Signed)
Summary: New Patient Letter  [facility] [place]  [address] [place], [place] [address]   Phone: [phone]  Fax: [phone]       11/01/2009 MRN: [id number]  [facility] [address] [place], [place]  [address]  Dear Mr. LIMBAUGH,   Welcome to [facility] and thank you for choosing [place] as your Primary Care Providers. Enclosed you will find information about our practice that we hope you find helpful. We have also enclosed forms to be filled out prior to your visit. This will provide [place] with the necessary information and facilitate your being seen in a timely manner. If you have any questions, please call us at: [phone] and we will be happy to assist you. We look forward to seeing you at your scheduled appointment time.  Appointment Tuesday, December 05, 2009 at 1:40pm  with Dr. Nolon Rod. Paz  Sincerely,  Primary Health Care Team  Please arrive 15 minutes early for your first appointment and bring your insurance card. Co-pay is required at the time of your visit.  *****Please call the office if you are not able to keep this appointment. There is a charge of $50.00 if any appointment is not cancelled or rescheduled within 24 hours*****

## 2010-10-23 NOTE — Miscellaneous (Signed)
Summary: Td 2007   Vaccine Record/[facility]  Vaccine Record/[facility]   Imported By: Lanelle Bal 02/05/2010 11:32:03  _____________________________________________________________________  External Attachment:    Type:   Image     Comment:   External Document  Appended Document: Td 2007   Vaccine Record/[facility] he is entered the Td in the system

## 2010-10-23 NOTE — Letter (Signed)
Summary: [facility]  [facility]   Imported By: Lanelle Bal 12/18/2009 09:21:07  _____________________________________________________________________  External Attachment:    Type:   Image     Comment:   External Document

## 2010-10-23 NOTE — Letter (Signed)
Summary: Eval Rescheduled/[facility]  Eval Rescheduled/[facility]   Imported By: Lanelle Bal 06/25/2010 12:40:39  _____________________________________________________________________  External Attachment:    Type:   Image     Comment:   External Document

## 2010-10-23 NOTE — Assessment & Plan Note (Signed)
Summary: cpx/kdc   Vital Signs:  Patient profile:   65 year old male Height:      72 inches Weight:      209 pounds O2 Sat:      88 % Pulse rate:   59 / minute BP sitting:   110 / 80  Vitals Entered By: Shary Decamp (December 26, 2009 12:47 PM) CC: cpx Comments  - pt has not had cig since last ov here!!!! Shary Decamp  December 26, 2009 12:51 PM    History of Present Illness: Depression-- symptoms well controlled  Cerebrovascular accident--asymptomatic,   no sequela  coronary artery disease-- records reviewed, doing well  COPD, mild dz per PFTs  4-10 essentially asymptomatic, quit tobacco last month  yearly checkup    Preventive Screening-Counseling & Management  Alcohol-Tobacco     Smoking Status: quit     Year Quit: 12/05/2009  Current Medications (verified): 1)  Simvastatin 80 Mg Tabs (Simvastatin) .Marland Kitchen.. 1 By Mouth Once Daily 2)  Symbicort 160-4.5 Mcg/act Aero (Budesonide-Formoterol Fumarate) .... Daily 3)  Sertraline Hcl 50 Mg Tabs (Sertraline Hcl) .Marland Kitchen.. 1 By Mouth Once Daily 4)  Bayer Aspirin 325 Mg Tabs (Aspirin) .Marland Kitchen.. 1 By Mouth Once Daily 5)  Mvi 6)  Trazodone Hcl 150 Mg Tabs (Trazodone Hcl) .... 2 By Mouth At Bedtime  Allergies (verified): 1)  ! Tylenol 2)  ! Codeine 3)  ! Vicodin 4)  ! 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  coronary artery disease stent  placement in the LAD in 1998. February 28, 2005 : acute non ST segment elevation myocardial infarction. He subsequently underwent thrombectomy and stenting of the circumflex. He was discharged on June 13, 200  March 21 2005- readmited, had a  PCI/drug-eluting stent implantation mid-LAD.    COPD, mild dz per PFTs  4-10 hyperlipidemia  Past Surgical History: Reviewed history from 12/05/2009 and no changes  required. 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 M - deceased MI F - deceased - lung Ca +smoker GM - DM aunt - DM colon ca-- no prostate ca--no  Social History: Single (his girlfrien is Mrs.  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 Resp:  Denies cough, coughing up blood, and sputum productive. GI:  Denies bloody stools, diarrhea, nausea, and vomiting. GU:  Denies dysuria, hematuria, and urinary hesitancy. Psych:  Denies anxiety and depression.  Physical Exam  General:  alert, well-developed, and well-nourished.   Neck:  no masses and normal carotid upstroke.   Lungs:  normal respiratory effort, no intercostal retractions, no accessory muscle use, and normal breath sounds.   Heart:  normal rate, regular rhythm, no murmur, and no gallop.   Abdomen:  soft, non-tender, normal bowel sounds, no distention, no masses, no guarding, and no rigidity.   Rectal:  No external abnormalities noted. Normal sphincter tone. No rectal masses or tenderness. brown stools Prostate:  Prostate gland firm and smooth, no enlargement, nodularity, tenderness, mass, asymmetry or induration. Extremities:  no lower extremity edema Neurologic:  alert & oriented X3, strength normal in all extremities, and gait normal.   Psych:  Cognition and judgment appear intact. Alert and cooperative with normal attention span and concentration.  not anxious appearing and not depressed appearing.     Impression & Recommendations:  Problem # 1:  HYPERLIPIDEMIA  (ICD-272.4) labs  His updated medication list for this problem includes:    Simvastatin 80 Mg Tabs (Simvastatin) .Marland Kitchen... 1 by mouth once daily  Labs Reviewed: SGOT: 24 (07/04/2009)   SGPT: 27 (07/04/2009)   HDL:43 (07/04/2009)  LDL:80 (07/04/2009)  Chol:130 (07/04/2009)  Trig:130 (07/04/2009)  Orders: TLB-TSH (Thyroid Stimulating Hormone) (84443-TSH) TLB-Lipid Panel (80061-LIPID) TLB-ALT (SGPT) (84460-ALT) TLB-AST (SGOT) (84450-SGOT)  Problem # 2:  ROUTINE GENERAL MEDICAL EXAM@HEALTH  CARE FACL (ICD-V70.0) Td 2008 never had a pneumonia shot, got  one today gets flu shot yearly cscope 11-09, hyperplastic polyps  Dr Randa Evens, next Cscope per GI check a PSA sees dentist routinely  recommend to continue being active and eat healthy  Problem # 3:  TOBACCO ABUSE (ICD-305.1) quit tobacco 2/11, praised   Problem # 4:  COPD (ICD-496) mild per PFTs, asymptomatic  at present His updated medication list for this problem includes:    Symbicort 160-4.5 Mcg/act Aero (Budesonide-formoterol fumarate) .Marland Kitchen... Daily  Problem # 5:  CORONARY ARTERY DISEASE (ICD-414.00) asymptomatic His updated medication list for this problem includes:    Bayer Aspirin 325 Mg Tabs (Aspirin) .Marland Kitchen... 1 by mouth once daily    Labs Reviewed: Chol: 130 (07/04/2009)   HDL: 43 (07/04/2009)   LDL: 80 (07/04/2009)   TG: 130 (07/04/2009)  Orders: TLB-BMP (Basic Metabolic Panel-BMET) (80048-METABOL) TLB-CBC Platelet - w/Differential (85025-CBCD)  Problem # 6:  DEPRESSION (ICD-311) symptoms well controlled His updated medication list for this problem includes:    Sertraline Hcl 50 Mg Tabs (Sertraline hcl) .Marland Kitchen... 1 by mouth once daily    Trazodone Hcl 150 Mg Tabs (Trazodone hcl) .Marland Kitchen... 2 by mouth at bedtime  Complete Medication List: 1)  Simvastatin 80 Mg Tabs (Simvastatin) .Marland Kitchen.. 1 by mouth once daily 2)  Symbicort 160-4.5 Mcg/act Aero (Budesonide-formoterol fumarate) .... Daily 3)  Sertraline Hcl 50 Mg Tabs (Sertraline  hcl) .Marland Kitchen.. 1 by mouth once daily 4)  Bayer Aspirin 325 Mg Tabs (Aspirin) .Marland Kitchen.. 1 by mouth once daily 5)  Mvi  6)  Trazodone Hcl 150 Mg Tabs (Trazodone hcl) .... 2 by mouth at bedtime  Other Orders: Venipuncture (84166) TLB-PSA (Prostate Specific Antigen) (84153-PSA) Pneumococcal Vaccine (06301) Admin 1st Vaccine (60109)  Patient Instructions: 1)  Please schedule a follow-up appointment in 6 months .  Prescriptions: TRAZODONE HCL 150 MG TABS (TRAZODONE HCL) 2 by mouth at bedtime  #180 x 3   Entered and Authorized by:   Elita Quick E. Geraldo Haris MD   Signed by:   Nolon Rod. Jasiel Belisle MD on 12/26/2009   Method used:   Print then Give to Patient   RxID:   [id number] SIMVASTATIN 80 MG TABS (SIMVASTATIN) 1 by mouth once daily  #90 x 3   Entered and Authorized by:   Nolon Rod. Rendell Thivierge MD   Signed by:   Nolon Rod. Calel Pisarski MD on 12/26/2009   Method used:   Print then Give to Patient   RxID:   [id number]    Preventive Care Screening  Prior Values:    Colonoscopy:  polyps  (10/24/2008)    Last Tetanus Booster:  Td (09/23/2006)    Risk Factors:  Tobacco use:  quit    Year quit:  12/05/2009 Drug use:  no Caffeine use:  0 drinks per day Alcohol use:  no Exercise:  yes    Times per week:  3  Colonoscopy History:    Date of Last Colonoscopy:  10/24/2008    Immunizations Administered:  Pneumonia Vaccine:    Vaccine Type: Pneumovax    Site: right deltoid    Mfr: Merck    Dose: 0.5 ml    Route: IM    Given by: Shary Decamp    Exp. Date: 01/03/2011    Lot #: [id number]
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
## 2010-10-23 NOTE — Letter (Signed)
Summary: [facility] @ [facility] [place]   Imported By: Lanelle Bal 12/18/2009 09:24:14  _____________________________________________________________________  External Attachment:    Type:   Image     Comment:   External Document

## 2010-10-23 NOTE — Procedures (Signed)
Summary: Colonoscopy/[facility]  Colonoscopy/[facility]   Imported By: Lanelle Bal 12/26/2009 10:20:04  _____________________________________________________________________  External Attachment:    Type:   Image     Comment:   External Document

## 2010-10-23 NOTE — Miscellaneous (Signed)
Summary: Td  Clinical Lists Changes  Observations: Removed observation of TD BOOSTER: Td (09/23/2006 13:34) Added new observation of TD BOOSTER: Historical (09/23/2005 13:18)      Immunization History:  Tetanus/Td Immunization History:    Tetanus/Td:  historical (09/23/2005)

## 2010-10-23 NOTE — Progress Notes (Signed)
Summary: appt  Phone Note Outgoing Call   Summary of Call: Pt needs a hosptial f/u in 1- 2 weeks please schedule. Army Fossa CMA  April 27, 2010 7:48 AM   Follow-up for Phone Call        PT HAS AN APPT [facility] 8/15 @ 3:40 FOR HOSPITAL FOLLOWUP.Marland KitchenKalyn Negrete  April 27, 2010 10:44 AM

## 2010-10-23 NOTE — Letter (Signed)
Summary: Primary Care Appointment Letter  [facility] [place]  [address] [place], [place] [address]   Phone: [phone]  Fax: [phone]    12/20/2009 MRN: [id number]  [facility] [address] [place], [place]  [address]  Dear Mr. JOSLIN,   Your Primary Care Physician Lincolnton E. Paz MD has indicated that:    _______it is time to schedule an appointment.    _______you missed your appointment on______ and need to call and          reschedule.    _______you need to have lab work done.    _______you need to schedule an appointment discuss lab or test results.    ____X___you need to call to reschedule your appointment that is                       scheduled on _APRIL 20,2011 2:00PM________.     Please call our office as soon as possible. Our phone number is [phone]_______. Please press option 1. Our office is open 8a-12noon and 1p-5p, Monday through Friday.     Thank you,    [facility] [facility] Scheduler

## 2010-10-23 NOTE — Letter (Signed)
Summary: [facility]  [facility]   Imported By: Lanelle Bal 12/18/2009 09:22:06  _____________________________________________________________________  External Attachment:    Type:   Image     Comment:   External Document

## 2010-10-25 NOTE — Assessment & Plan Note (Signed)
Summary: 3 month followup--he will be fasting//sph   Vital Signs:  Patient profile:   64 year old male Weight:      207 pounds Pulse rate:   84 / minute Pulse rhythm:   regular BP sitting:   148 / 88  (left arm) Cuff size:   large  Vitals Entered By: Army Fossa CMA (September 26, 2010 1:53 PM) CC: 3 month f/u- fasting  Comments [facility] [place]    History of Present Illness: ROV back pain-- unable to get a local shot b/c "cardiology would not stop Plavix" however at the same time he states that he cannot afford Plavix, metoprolol, crestor  or lisinopril and has not been taking them for a while.       Current Medications (verified): 1)  Symbicort 160-4.5 Mcg/act Aero (Budesonide-Formoterol Fumarate) .... Daily 2)  Sertraline Hcl 50 Mg Tabs (Sertraline Hcl) .Marland Kitchen.. 1 By Mouth Once Daily 3)  Bayer Aspirin 325 Mg Tabs (Aspirin) .Marland Kitchen.. 1 By Mouth Once Daily 4)  Multivitamins   Tabs (Multiple Vitamin) .Marland Kitchen.. 1 Tab Once Daily 5)  Trazodone Hcl 150 Mg Tabs (Trazodone Hcl) .... 2 By Mouth At Bedtime 6)  Nitrostat 0.4 Mg Subl (Nitroglycerin) .Marland Kitchen.. 1 Tablet Under Tongue At Onset of Chest Pain; You May Repeat Every 5 Minutes For Up To 3 Doses. 7)  Crestor 40 Mg Tabs (Rosuvastatin Calcium) .Marland Kitchen.. 1 By Mouth Daily  Allergies (verified): 1)  ! Tylenol 2)  ! Codeine 3)  ! Vicodin 4)  ! Tetracycline   Past History:  Past Medical History: Reviewed history from 06/27/2010 and no changes required. 1. Depression 2. Cerebrovascular accident, hx of (x2)  one in 2000 and one in 2001.The latter stroke was reportedly due to emboli from a mitral valve vegetation.  3. CAD-stent  placement in the LAD in 1998. February 28, 2005 : acute non ST segment elevation myocardial infarction. He subsequently underwent thrombectomy and stenting of the circumflex. He was discharged on June 13, 200  March 21 2005- readmited, had a  PCI/drug-eluting stent implantation mid-LAD. AMI 8-11, stent with in stent restenosis  Circumflex, sp PTCA balloon angioplasty  4. COPD, mild dz per PFTs  4-10 5. Hyperlipidemia  Past Surgical History: Reviewed history from 05/07/2010 and no changes required. 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 Resp:  Denies cough and shortness of breath.  Physical Exam  General:  alert and well-developed.   Lungs:  normal respiratory effort, no intercostal retractions, no accessory muscle use, and normal breath sounds.   Heart:  normal rate, regular rhythm, no murmur, and no gallop.   Extremities:  no pretibial edema bilaterally    Impression & Recommendations:  Problem # 1:  ? of DIABETES MELLITUS (ICD-250.00) borderline diabetes with a hemoglobin A1c of 5.9 on a diet only His updated medication list for this problem includes:    Bayer Aspirin 325 Mg Tabs (Aspirin) .Marland Kitchen... 1 by mouth once daily    Lisinopril 5 Mg Tabs (Lisinopril) .Marland Kitchen... Take one  tablet by mouth daily  Labs Reviewed: Creat: 1.1 (12/26/2009)    Reviewed HgBA1c results: 5.9 (06/27/2010)  5.2 (07/04/2009)  Problem # 2:  HYPERLIPIDEMIA (ICD-272.4) not taking crestor ----> $$$$ change to pravachol  His updated medication list for this problem includes:    Pravachol 40 Mg Tabs (Pravastatin sodium) .Marland Kitchen... 1 by mouth at bedtime  Labs Reviewed: SGOT: 24 (12/26/2009)   SGPT: 23 (12/26/2009)   HDL:38.80 (12/26/2009), 43 (07/04/2009)  LDL:53 (12/26/2009), 80 (07/04/2009)  Chol:104 (12/26/2009), 130 (07/04/2009)  Trig:62.0 (12/26/2009), 130  (07/04/2009)  Problem # 3:  COPD (ICD-496) states he quit tobacco 09-23-10 His updated medication list for this problem includes:    Symbicort 160-4.5 Mcg/act Aero (Budesonide-formoterol fumarate) .Marland Kitchen... Daily  Problem # 4:  CORONARY ARTERY DISEASE (ICD-414.00) reports he is unable to afford Plavix, metoprolol,   Lisinopril plan: New prescriptions provided for  metoprolol,   Lisinopril and recommended to get them  for $4 at [facility] The following medications were removed from the medication list:    Plavix 75 Mg Tabs (Clopidogrel bisulfate) .Marland Kitchen... 1 by mouth daily. His updated medication list for this problem includes:    Bayer Aspirin 325 Mg Tabs (Aspirin) .Marland Kitchen... 1 by mouth once daily    Metoprolol Tartrate 25 Mg Tabs (Metoprolol tartrate) .Marland Kitchen... 1/2 tab two times a day    Nitrostat 0.4 Mg Subl (Nitroglycerin) .Marland Kitchen... 1 tablet under tongue at onset of chest pain; you may repeat every 5 minutes for up to 3 doses.    Lisinopril 5 Mg Tabs (Lisinopril) .Marland Kitchen... Take one tablet by mouth daily  Complete Medication List: 1)  Symbicort 160-4.5 Mcg/act Aero (Budesonide-formoterol fumarate) .... Daily 2)  Sertraline Hcl 50 Mg Tabs (Sertraline hcl) .Marland Kitchen.. 1 by mouth once daily 3)  Bayer Aspirin 325 Mg Tabs (Aspirin) .Marland Kitchen.. 1 by mouth once daily 4)  Multivitamins Tabs (Multiple vitamin) .Marland Kitchen.. 1 tab once daily 5)  Trazodone Hcl 150 Mg Tabs (Trazodone hcl) .... 2 by mouth at bedtime 6)  Metoprolol Tartrate 25 Mg Tabs (Metoprolol tartrate) .... 1/2 tab two times a day 7)  Nitrostat 0.4 Mg Subl (Nitroglycerin) .Marland Kitchen.. 1 tablet under tongue at onset of chest pain; you may repeat every 5 minutes for up to 3 doses. 8)  Pravachol 40 Mg Tabs (Pravastatin sodium) .Marland Kitchen.. 1 by mouth at bedtime 9)  Lisinopril 5 Mg Tabs (Lisinopril) .... Take one tablet by mouth daily  Patient Instructions: 1)  Please schedule a follow-up appointment in 2 months, fasting  Prescriptions: PRAVACHOL 40 MG TABS (PRAVASTATIN SODIUM) 1 by mouth at bedtime   #30 x 3   Entered and Authorized by:   Nolon Rod. Hager Compston MD   Signed by:   Nolon Rod. Trooper Olander MD on 09/26/2010   Method used:   Print then Give to Patient   RxID:   [id number] LISINOPRIL 5 MG TABS (LISINOPRIL) Take one tablet by mouth daily  #30 x 6   Entered and Authorized by:   Nolon Rod. Lazarius Rivkin MD   Signed by:   Nolon Rod. Arris Meyn MD on 09/26/2010   Method used:   Print then Give to Patient   RxID:   [id number] METOPROLOL TARTRATE 25 MG TABS (METOPROLOL TARTRATE) 1/2 tab two times a day  #30 x 6   Entered and Authorized by:   Nolon Rod. Steffon Gladu MD   Signed by:   Nolon Rod. Hady Niemczyk MD on 09/26/2010   Method used:   Print then Give to Patient   RxID:   [id number]  Orders Added: 1)  Est. Patient Level III [81859]

## 2010-10-25 NOTE — Progress Notes (Signed)
Summary: Refill--Trazodone  Phone Note Refill Request Message from:  Fax from Pharmacy on October 17, 2010 9:20 AM  Refills Requested: Medication #1:  TRAZODONE HCL 150 MG TABS 2 by mouth at bedtime   Notes: [facility] Please advise.  Next Appointment Scheduled: 12/31/10 Initial call taken by: Lucious Groves CMA,  October 17, 2010 9:20 AM  Follow-up for Phone Call        180, 1 Rf Follow-up by: Nolon Rod. Tulip Meharg MD,  October 17, 2010 10:58 AM    Prescriptions: TRAZODONE HCL 150 MG TABS (TRAZODONE HCL) 2 by mouth at bedtime  #180 x 1   Entered by:   Lucious Groves CMA   Authorized by:   Nolon Rod. Belva Koziel MD   Signed by:   Lucious Groves CMA on 10/17/2010   Method used:   Printed then faxed to ...       [facility]       [address]       [place], [place]  [address]       Ph: [phone]       Fax: [phone]   RxID:   [id number]

## 2010-10-31 ENCOUNTER — Ambulatory Visit: Payer: Self-pay

## 2010-11-14 ENCOUNTER — Encounter: Payer: Self-pay | Admitting: Internal Medicine

## 2010-11-29 ENCOUNTER — Encounter (INDEPENDENT_AMBULATORY_CARE_PROVIDER_SITE_OTHER): Payer: Self-pay | Admitting: *Deleted

## 2010-12-04 NOTE — Letter (Signed)
Summary: Primary Care Appointment Letter  [facility] [place]  [address] [place], [place] [address]   Phone: [phone]  Fax: [phone]    11/29/2010 MRN: [id number]  [facility] [address] [place], [place]  [address]  Dear Mr. HIPPE,   Your Primary Care Physician [place] E. Paz MD has indicated that:    _______it is time to schedule an appointment.    _______you missed your appointment on______ and need to call and          reschedule.    _______you need to have lab work done.    _______you need to schedule an appointment discuss lab or test results.      Arlester Marker     You need to call to reschedule your appointment that is                       scheduled for Monday December 31, 2010.  This appointment was for a physical at 1:00 PM.  Dr Drue Novel will not be in the office that day.     Please call our office as soon as possible. Our phone number is [phone]. Our office is open 8a-12noon and 1p-5p, Monday through Friday.     Thank you,     Primary Care Scheduler

## 2010-12-07 LAB — CARDIAC PANEL(CRET KIN+CKTOT+MB+TROPI)
CK, MB: 42 ng/mL (ref 0.3–4.0)
Total CK: 384 U/L — ABNORMAL HIGH (ref 7–232)
Troponin I: 4.65 ng/mL (ref 0.00–0.06)
Troponin I: 8.06 ng/mL (ref 0.00–0.06)

## 2010-12-07 LAB — PLATELET INHIBITION P2Y12: Platelet Function Baseline: 248 [PRU] (ref 194–418)

## 2010-12-07 LAB — COMPREHENSIVE METABOLIC PANEL
ALT: 21 U/L (ref 0–53)
AST: 57 U/L — ABNORMAL HIGH (ref 0–37)
Albumin: 3.2 g/dL — ABNORMAL LOW (ref 3.5–5.2)
Alkaline Phosphatase: 64 U/L (ref 39–117)
Calcium: 8.8 mg/dL (ref 8.4–10.5)
GFR calc Af Amer: >60 mL/min (ref >60)
Potassium: 4 mEq/L (ref 3.5–5.1)
Sodium: 141 mEq/L (ref 135–145)
Total Protein: 5.9 g/dL — ABNORMAL LOW (ref 6.0–8.3)

## 2010-12-07 LAB — LIPID PANEL
HDL: 35 mg/dL — ABNORMAL LOW (ref >39)
LDL Cholesterol: 40 mg/dL (ref 0–99)
Total CHOL/HDL Ratio: 2.8 RATIO
Triglycerides: 115 mg/dL (ref <150)
VLDL: 23 mg/dL (ref 0–40)

## 2010-12-07 LAB — POCT CARDIAC MARKERS: Troponin i, poc: <0.05 ng/mL (ref 0.00–0.09)

## 2010-12-07 LAB — CBC
HCT: 40.8 % (ref 39.0–52.0)
Hemoglobin: 13.7 g/dL (ref 13.0–17.0)
MCHC: 33.6 g/dL (ref 30.0–36.0)
MCV: 93.8 fL (ref 78.0–100.0)
Platelets: 185 10*3/uL (ref 150–400)
RDW: 13.9 % (ref 11.5–15.5)
WBC: 7.8 10*3/uL (ref 4.0–10.5)

## 2010-12-07 LAB — BASIC METABOLIC PANEL
Chloride: 108 mEq/L (ref 96–112)
GFR calc non Af Amer: >60 mL/min (ref >60)
Glucose, Bld: 97 mg/dL (ref 70–99)
Potassium: 4.3 mEq/L (ref 3.5–5.1)
Sodium: 143 mEq/L (ref 135–145)

## 2010-12-31 ENCOUNTER — Encounter: Payer: Self-pay | Admitting: Internal Medicine

## 2011-01-04 ENCOUNTER — Encounter: Payer: Self-pay | Admitting: Internal Medicine

## 2011-01-04 ENCOUNTER — Ambulatory Visit (INDEPENDENT_AMBULATORY_CARE_PROVIDER_SITE_OTHER): Payer: Medicare Other | Admitting: Internal Medicine

## 2011-01-04 DIAGNOSIS — F329 Major depressive disorder, single episode, unspecified: Secondary | ICD-10-CM

## 2011-01-04 DIAGNOSIS — Z0000 Encounter for general adult medical examination without abnormal findings: Secondary | ICD-10-CM | POA: Insufficient documentation

## 2011-01-04 DIAGNOSIS — I2510 Atherosclerotic heart disease of native coronary artery without angina pectoris: Secondary | ICD-10-CM

## 2011-01-04 DIAGNOSIS — F17200 Nicotine dependence, unspecified, uncomplicated: Secondary | ICD-10-CM

## 2011-01-04 DIAGNOSIS — M549 Dorsalgia, unspecified: Secondary | ICD-10-CM

## 2011-01-04 DIAGNOSIS — J449 Chronic obstructive pulmonary disease, unspecified: Secondary | ICD-10-CM

## 2011-01-04 DIAGNOSIS — Z125 Encounter for screening for malignant neoplasm of prostate: Secondary | ICD-10-CM

## 2011-01-04 DIAGNOSIS — E785 Hyperlipidemia, unspecified: Secondary | ICD-10-CM

## 2011-01-04 LAB — BASIC METABOLIC PANEL
BUN: 14 mg/dL (ref 6–23)
CO2: 29 mEq/L (ref 19–32)
Chloride: 108 mEq/L (ref 96–112)
Creatinine, Ser: 1 mg/dL (ref 0.4–1.5)

## 2011-01-04 LAB — CBC WITH DIFFERENTIAL/PLATELET
Eosinophils Relative: 2 % (ref 0.0–5.0)
HCT: 42.3 % (ref 39.0–52.0)
Hemoglobin: 14.5 g/dL (ref 13.0–17.0)
Lymphs Abs: 1.4 10*3/uL (ref 0.7–4.0)
Monocytes Relative: 7.3 % (ref 3.0–12.0)
Neutro Abs: 5.1 10*3/uL (ref 1.4–7.7)
RDW: 14.9 % — ABNORMAL HIGH (ref 11.5–14.6)
WBC: 7.2 10*3/uL (ref 4.5–10.5)

## 2011-01-04 LAB — LIPID PANEL
Cholesterol: 202 mg/dL — ABNORMAL HIGH (ref 0–200)
Total CHOL/HDL Ratio: 5

## 2011-01-04 LAB — AST: AST: 19 U/L (ref 0–37)

## 2011-01-04 LAB — ALT: ALT: 15 U/L (ref 0–53)

## 2011-01-04 MED ORDER — TRAZODONE HCL 150 MG PO TABS: 300.0000 mg | ORAL_TABLET | Freq: Every day | ORAL | Status: DC

## 2011-01-04 NOTE — Patient Instructions (Signed)
See your heart doctor , call for an appointment

## 2011-01-04 NOTE — Assessment & Plan Note (Signed)
Counseled, risk discussed Needs to see dentist

## 2011-01-04 NOTE — Progress Notes (Signed)
Subjective:    Patient ID: [name], male    DOB: March 28, 1946, 66 y.o.   MRN: [id number]  HPI Here for CPX,  chart reviewed Back pain-- on going issue , not on pain meds, on gabapentin CAD-- had a hard time affording plavix but has been able to afford it  Cholesterol--  good medication compliance  Depression well controlled   Past Medical History  Diagnosis Date  . Depression   . CVA (cerebral vascular accident) 2000,2001    The latter stoke was reportedly due to emboli from mitral valve vegetation  . Depression   . CAD (coronary artery disease) 1998  . COPD, mild 12/2008    dx per PFTs   . Hyperlipidemia   . Colon polyp     three 2-10mm polyps in descending colon, medium sized lipoma in sigmoid colon,  multiple in recto-sigmoid colon   Past Surgical History  Procedure Date  . Appendectomy   . Tonsillectomy   . Lumbar fusion     x5 Dr. Samara Deist  . 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 Coronary stent placement 1998    placed in the LAD  . Thrombectomy 02/2005    acute non ST segment elevation myocardial infarction  . Coronary stent placement 02/2005    PCI/drug-eluting stent implantation mid-LAD  . Balloon angioplasty, artery 04/2010    sp PTCA  . Nasal fracture surgery     x2  . Inguinal hernia repair     x3   Family History  Problem Relation Age of Onset  . Heart attack Mother   . Lung cancer Father     +smoker  . Diabetes      GM, aunt, other memebers  . Prostate cancer Neg Hx        . Colon cancer Neg Hx    History   Social History  . Marital Status: Widowed    Spouse Name: N/A    Number of Children: N/A  . Years of Education: N/A   Occupational History  . Not on file.   Social History Main Topics  . Smoking status: Current Everyday Smoker -- 0.5 packs/day    Types: Cigarettes    Last Attempt to Quit: 09/23/2010  . Smokeless tobacco: Not on file  . Alcohol Use: Yes     rarelly   . Drug Use: No     Previously smoke 1/3 ppd  .  Sexually Active: Not on file   Other Topics Concern  . Not on file   Social History Narrative   Has a girl friend     Review of Systems  Constitutional:       Diet better , has lost 7 pounds !  Cardiovascular: Negative for chest pain, palpitations and leg swelling.  Gastrointestinal: Negative for nausea, vomiting, abdominal pain, diarrhea and blood in stool.  Genitourinary: Negative for dysuria, urgency, frequency and hematuria.       Objective:   Physical Exam  Constitutional: He is oriented to person, place, and time. He appears well-developed and well-nourished.  HENT:  Head: Normocephalic and atraumatic.  Neck: No tracheal deviation present. No thyromegaly present.  Cardiovascular: Normal rate, regular rhythm and normal heart sounds.   No murmur heard.      Normal carotid pulse  Pulmonary/Chest: Effort normal and breath sounds normal. No respiratory distress. He has no wheezes. He has no rales.  Abdominal: Soft. Bowel sounds are normal. He exhibits no distension and  no mass. There is no tenderness. There is no rebound and no guarding.       No bruit  Genitourinary:       Declined DRE  Musculoskeletal: He exhibits no edema.  Neurological: He is alert and oriented to person, place, and time.  Psychiatric: He has a normal mood and affect. His behavior is normal. Judgment and thought content normal.          Assessment & Plan:

## 2011-01-04 NOTE — Assessment & Plan Note (Signed)
On crestor, labs

## 2011-01-04 NOTE — Assessment & Plan Note (Signed)
Stable, doing well Still smokes, counseled

## 2011-01-04 NOTE — Assessment & Plan Note (Addendum)
Td 2008 Shingles shot , never had one , benefits discussed   pneumonia shot 12-2009  cscope 11-09, hyperplastic polyps  Dr Randa Evens, next Cscope per GI check a PSA recommend to continue being active and eat healthy

## 2011-01-04 NOTE — Assessment & Plan Note (Signed)
Stable

## 2011-01-04 NOTE — Assessment & Plan Note (Signed)
Last OV w/ cards 05-2010, rec to set an appointment w/ them although he is asx

## 2011-01-04 NOTE — Assessment & Plan Note (Signed)
At some point would like to see ortho

## 2011-01-07 ENCOUNTER — Telehealth: Payer: Self-pay | Admitting: *Deleted

## 2011-01-07 NOTE — Telephone Encounter (Signed)
Memory full on home phone, not excepting calls on cell phone. Will try again. Mailed diet.

## 2011-01-07 NOTE — Telephone Encounter (Signed)
Message copied by Army Fossa on Mon Jan 07, 2011  2:26 PM ------      Message from: [name]      Created: Mon Jan 07, 2011  2:15 PM       Advise patient:      His cholesterol needs better control, continue with Crestor, add zetia 10mg  1 po qd  by mouth daily.      His potassium is slightly elevated, send a low K+ diet.      PSA and all the other labs are within normal.      Please come back to the office in 6-8 weeks for a checkup, fasting

## 2011-01-08 NOTE — Telephone Encounter (Signed)
Unable to leave VM for pt.

## 2011-01-09 NOTE — Telephone Encounter (Signed)
Unable to leave VM for pt.

## 2011-01-10 ENCOUNTER — Encounter: Payer: Self-pay | Admitting: *Deleted

## 2011-01-10 ENCOUNTER — Other Ambulatory Visit: Payer: Self-pay | Admitting: *Deleted

## 2011-01-10 MED ORDER — EZETIMIBE 10 MG PO TABS: 10.0000 mg | ORAL_TABLET | Freq: Every day | ORAL | Status: DC

## 2011-01-10 NOTE — Telephone Encounter (Signed)
Unable to reach pt, mailed copy of labs and doctors response. Mailed Zetia rx as well.

## 2011-01-16 ENCOUNTER — Telehealth: Payer: Self-pay | Admitting: Internal Medicine

## 2011-01-16 MED ORDER — SERTRALINE HCL 50 MG PO TABS: 50.0000 mg | ORAL_TABLET | Freq: Every day | ORAL | Status: DC

## 2011-01-16 NOTE — Telephone Encounter (Signed)
Patient needs 30 day prescription for Sertraline 50mg  called into [address], [place], [place]

## 2011-01-16 NOTE — Telephone Encounter (Signed)
Pt unable to afford Zetia would like alternative medication.

## 2011-01-16 NOTE — Telephone Encounter (Signed)
Patient said he received information with his recent labs that Dr Drue Novel wanted patient to take a new cholesterol medicine along with his Crestor---he went to the pharmacy and found out that new medication would cost $70.00 and he cant afford that---doesn't know name of new prescription----can Dr Drue Novel suggest a different medication that is not so expensive???    Patient uses [address] on [place], [place]

## 2011-01-17 NOTE — Telephone Encounter (Signed)
Message left for patient to return my call.  

## 2011-01-17 NOTE — Telephone Encounter (Signed)
Unfortunately there is no generic zetia: Please stay on crestor 40mg  1 every day, watch diet-exercise more Recheck cholesterol on RTC Consider lipid clinic referal

## 2011-01-17 NOTE — Telephone Encounter (Signed)
**   correction unable to leave message, memory full will try later.

## 2011-01-18 NOTE — Telephone Encounter (Signed)
Memory full.

## 2011-01-18 NOTE — Telephone Encounter (Signed)
Memory is full, unable to reach pt.

## 2011-01-20 NOTE — Telephone Encounter (Signed)
Send letter  [name]

## 2011-01-21 ENCOUNTER — Encounter: Payer: Self-pay | Admitting: *Deleted

## 2011-02-08 NOTE — Op Note (Signed)
NAME:  [name], [name]                         ACCOUNT NO.:  [id number]   MEDICAL RECORD NO.:  [id number]                   PATIENT TYPE:  AMB   LOCATION:  DSC                                  FACILITY:  [facility]   PHYSICIAN:  Cindee Salt, M.D.                    DATE OF BIRTH:  10-01-1945   DATE OF PROCEDURE:  01/06/2003  DATE OF DISCHARGE:                                 OPERATIVE REPORT   PREOPERATIVE DIAGNOSIS:  Tardy ulnar nerve palsy, left arm.   POSTOPERATIVE DIAGNOSIS:  Tardy ulnar nerve palsy, left arm.   PROCEDURE:  Subcutaneous transposition, left ulnar nerve.   SURGEON:  Cindee Salt, M.D.   ASSISTANT:  Nurse specialists.   ANESTHESIA:  Axillary/general.   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  This has not responded to  conservative treatment.  EMG nerve conduction confirms.   DESCRIPTION OF PROCEDURE:  The patient was brought to the green room where  an axillary block was carried out without difficulty.  He was prepped and  draped with DuraPrep in the supine position with the left arm free. He had  some feeling upon exsanguination of the limb.  After making a longitudinal  incision in the medial aspect of his left elbow, a general anesthetic was  given.  Dissection was carried down to the forearm fascia with protection of  the medial brachiocutaneous nerve.  The SunGard was opened, released.  Significant scarring was present about the entire length of the nerve. The  medial intermuscular septum was released from its attachment on the humerus  and left attached to the epicondyle.  Dissection was carried up to the  arcade of Struthers.  The forearm fascia of the FCU was released, protecting  branches to the muscle.  A band of tissue was present deep. This was also  released, protecting the nerve.   A portion of origin of the FCU was then released, leaving it attached to the  medial  epicondyle distally.  The nerve was then transposed with its blood  vessels anteriorly.  The 2 slings were then created and sutured into  position with figure-of-eight 4-0 Vicryl sutures, maintaining the nerve  anterior to the epicondyle. Flexion and extremities revealed good motion  without any subluxation. The wound was irrigated and subcutaneous tissue was  closed with interrupted 4-0 Vicryl and the skin with a subcuticular 3-0  Monocryl suture. Steri-Strips were applied, sterile compressive dressing,  long-arm splint applied.  The patient tolerated the procedure well, taken to  the recovery room for observation in satisfactory condition.   The patient was discharged to home to return to the [facility]  in one week, on Vicoprofen and Keflex.  Cindee Salt, M.D.    GK/MEDQ  D:  01/06/2003  T:  01/06/2003  Job:  [id number]

## 2011-02-08 NOTE — Discharge Summary (Signed)
[place]  Patient:    [name], [name] Visit Number: [id number] MRN: [id number]          Service Type: SUR Location: [id number] Attending Physician:  Colon Branch Dictated by:   Clydene Fake, M.D. Admit Date:  07/16/2001 Discharge Date: 07/21/2001                             Discharge Summary  DIAGNOSES: 1. Lumbar stenosis. 2. Spondylolisthesis. 3. Instability and prior lumbar surgery and fusion.  POSTOPERATIVE DIAGNOSES: 1. Lumbar stenosis. 2. Spondylolisthesis. 3. Instability and prior lumbar surgery and fusion.  PROCEDURES: 1. Decompressive laminectomy of L2-5. 2. Posterior lumbar interbody fusion, L3-4 and L4-5 with Brantigan    interbody cages of both levels, segmental pedicle screw fixation    through L2, L3, L4 and L5. 3. Open reduction and internal fixation of spondylolisthesis,    posterolateral fusion L2-5, radial iliac crest graft harvest through    separate incision and exploration of prior fusion.  REASON FOR ADMISSION:  The patient is a 65 year old gentleman who has had four prior lumbar surgeries, including an attempted fusion in 39 or 41.  He has been having intermittent back pain with leg problems and over the last year, things have been progressively worsening with back pain and especially right leg pain, in the L4-5 type distribution with any type of activity.  Workup included flexion and extension lower spine films showing abnormal motion at L3-4 and L4-5, spondylolithesis, and scoliosis at these levels.  MRI was done showing extensive postoperative changes at canal, stenosis at L3-4, and lateral disk herniation right side at that level and at L4-5 with foraminal narrowing there.  CT was also done showing the attempted posterolateral fusion that did not look like good fusion was obtained, which was also shown not to by movement with flexion and extension.  The patient was brought in for laminectomy and  fusion.  HOSPITAL COURSE:  The patient was admitted on the day of surgery and underwent the procedures above.  There was a small dural tear intraoperatively and to which was fibrin glue was placed.  He had some notable blood loss at 850 cc or so, but he did receive 350 cc of Cell Saver blood and hematocrit was watched postoperatively.  Postoperatively, he was transferred to the floor and there he was kept on flat bed rest for 48 hours.  Pain was controlled with PCA, though he had potential reaction to the morphine and this was changed to Demerol.  He had some drainage from the incision and the first couple of days when he was not having a headache.  After the 48 hours, he slowly increased head of bed.  There were no symptoms of CSF leak and he was up ambulating.  The first day, he did have some drainage from the incision, but it was not clear that this was CSF and he had no symptoms of headache went up and this drainage diminished greatly by the next day.  He was switched from IV antibiotics to Cipro p.o. and he was on Diamox from the time of surgery with no problems there.  Labs were followed and no problems with electrolytes.  H&H remained stable. He did not need any more blood transfusion.  He is ambulating well and having bowel movements and urinating.  He has less pain in the legs.  We discharged home in stable condition on July 21, 2001.  DISCHARGE MEDICATIONS:  Same as prehospitalization, except no nonsteroidal anti-inflammatories and then: 1. Percocet 1-2 p.o. q.4-6h. p.r.n. 2. Flexeril p.r.n. 3. Cipro 500 mg b.i.d. x 5 days. 4. Diamox 250 mg t.i.d. x 2 days and b.i.d. x 3 days and then discontinue.  DIET:  As tolerated.  ACTIVITY:  No strenuous activity.  WOUND CARE:  Change dressing as needed to keep incision dry.  Up with brace only.  FOLLOWUP:  Follow up with me in my office in 10-14 days for wound check and possible stable removal. Dictated by:   Clydene Fake, M.D. Attending Physician:  Colon Branch DD:  07/21/01 TD:  07/21/01 Job: [id number]

## 2011-02-08 NOTE — H&P (Signed)
[place]  Patient:    [name], [name] Visit Number: [id number] MRN: [id number]          Service Type: SUR Location: [facility] 3172 02 Attending Physician:  Colon Branch Dictated by:   Clydene Fake, M.D. Admit Date:  07/16/2001                           History and Physical  CHIEF COMPLAINT:  Back and right leg pain.  HISTORY OF PRESENT ILLNESS:  The patient is a 65 year old right-handed gentleman who has had four prior back operations, one with a fusion in 68 or 1991.  He has had intermittent back pain and some leg problems which have since appeared.  A year and a half ago was worked up with an MRI because of back and right leg pain.  Things improved a little bit after that but over the last six months, the pain has been getting worse.  It is almost a constant radiating from his back to the right hip, down the right leg to the anterior thigh and occasionally the top of the foot, with L4 and maybe L5 components. He has noticed weakness both in the quadriceps and the right leg and some numbness and tingling in the same distributions.  All this has been worsening over the last few months.  He has been nonsteroidal anti-inflammatories, and this has not helped.  He does not have any leg problems and no bowel or bladder complaints.  He has undergone x-rays of his lumbar spine showing spondylolisthesis at 3-4 and 4-5 with abnormal motion with flexion-extension at both of those levels.  A new MRI was done showing extensive postop changes and canal stenosis at 3-4 and severe degenerative disk disease at 3-4 and 4-5. A CT was also done, showing the prior laminectomy from L3 through S1 but stenosis at the 3-4 level and attempted posterolateral fusion can be seen posterolaterally, but the patient is not fused.  The patient will be admitted for decompression and fusion of the lumbar spine.  PAST MEDICAL HISTORY:  Significant for MI in 1996, stroke, heart  disease.  He has had a recent stress test, does not have any chest pain.  PAST SURGICAL HISTORY:  Lumbar laminectomies in 1985, 1989, 1990, and 1991. Shoulder surgery in 1988.  Colon resection in 1987.  Three hernia surgeries in the 1960s.  Appendectomy in 1987.  A stent in his coronary artery placed in 1996.  MEDICATIONS:  Coumadin, which he stopped the week prior to surgery.  An aspirin a day, which he also stopped prior to surgery.  Lorazepam.  Naproxen.  ALLERGIES:  CODEINE, TETRACYCLINE, FOSSULATE, TYLENOL.  SOCIAL HISTORY:  He is retired.  Smokes one pack per day but quit smoking about two months ago.  Does not drink alcohol.  He is married.  REVIEW OF SYSTEMS:  Otherwise negative.  FAMILY HISTORY:  Noncontributory.  PHYSICAL EXAMINATION:  VITAL SIGNS:  Weight is 212.  Height 6 feet.  Blood pressure 137/89, pulse 80, temperature 97.1.  GENERAL:  Pleasant, in no apparent distress.  HEENT:  Unremarkable.  NECK:  Supple.  CHEST:  Lungs clear.  CARDIAC:  Regular rate and rhythm.  ABDOMEN:  Soft, nontender.  EXTREMITIES:  Intact, no edema.  BACK/NEURLOLOGIC:  Back decreased in extension.  Right lateral bending brings on radicular symptoms.  There are some paraspinous muscle spasms noted.  Gait is fairly normal.  Straight leg raising is  positive on the right for L4 radicular symptoms, negative on the left.  Motor strength shows 4+/5 strength in the right quads, otherwise 5/5 strength in all muscle groups.  Sensation slightly decreased in the entire right lower extremity.  Left intact.  Deep tendon reflexes are diminished at the left knee, 2 at the right knee, both decreased at the ankles bilaterally.  ASSESSMENT AND PLAN:  Patient with spinal stenosis and multiple prior surgeries and even attempted fusion.  The patient will be admitted for redo decompressive laminectomy at 3-4 and 4-5, with pedicle screw fixation and possible posterior interbody fusion with  interbody cages at these two levels. The potential posterolateral fusion, potential iliac crest graft harvest, and potential fusion even higher than 3-4 and maybe even down to and including 5-1 depending on intraoperative findings. Dictated by:   Clydene Fake, M.D. Attending Physician:  Colon Branch DD:  07/15/01 TD:  07/16/01 Job: [id number]

## 2011-02-08 NOTE — Discharge Summary (Signed)
[name], [name] NO.:  [id number]   MEDICAL RECORD NO.:  [id number]          PATIENT TYPE:  INP   LOCATION:  6532                         FACILITY:  [facility]   PHYSICIAN:  Hollice Espy, M.D.DATE OF BIRTH:  20-Jan-1946   DATE OF ADMISSION:  02/28/2005  DATE OF DISCHARGE:  03/05/2005                                 DISCHARGE SUMMARY   CONSULTATIONS:  Francisca December, M.D., Cardiology.   DISCHARGE DIAGNOSES:  1.  Non-ST elevated myocardial infarction.  2.  Status post cardiac catheterization x2, one done on March 01, 2005 and one      done on March 04, 2005 with findings of intraluminal thrombus in left      circumflex leading to #1.  3.  Three-vessel atherosclerotic coronary vascular disease.  4.  Status post stent placement with adjunctive intracoronary thrombectomy      in left circumflex.  5.  History of myocardial infarction x2 in the past.  6.  History of cerebrovascular accident x2.  7.  Ongoing tobacco use.  8.  History of left ulnar repair.   DISCHARGE MEDICATIONS:  1.  The patient will continue on aspirin 325 mg p.o. daily.  2.  He is also adding on Plavix 75 mg p.o. daily.  3.  He is also receiving a prescription for Ambien 5 mg p.o. q.h.s. p.r.n.   HOSPITAL COURSE:  The patient is a 65 year old white male with past medical  history of reported MI x2, stent placement in 1998 as well as CVA x2 in 2000  and 2001 who presented to the emergency room on February 28, 2005 complaining of  mid sternal chest pain radiating to both sides.  Initially EKG and cardiac  enzymes were unremarkable although CPK level alone was elevated.  The  patient's index and troponin were normal.  It was initially felt that the  patient while at risk for MI given his previous history was possibly having  a pulmonary embolus or pericarditis.  In addition, his white count was  seemed to be elevated.  The patient was admitted for further observation and  serial testing.  The  patient's CT scan of his chest was negative for PE,  however, by the next following morning his enzymes had significantly  increased and he was felt to have ruled in for a non-Q-wave MI.  At that  time Dr. Ty Hilts from Cardiology was consulted to see the patient.  Specifically the patient's enzymes had increased to a troponin of 2.45, his  CPK-MB 30.63 and 30.6.  The patient underwent a cardiac catheterization  which showed transient occlusion of the left circumflex residual thrombus  likely causing this.  In addition, other coronary artery disease was noted.  The patient was started on IV heparin and IV nitroglycerin x48 hours with  followup cardiac catheterization on Monday, March 01, 2005 was a Friday.  The  patient over the weekend remained relatively chest pain-free on  nitroglycerin.  He was doing well.  He had one bout of chest pain which  resulted in increase of nitroglycerin.  He remained stable and then  underwent a repeat catheterization on March 04, 2005.  At that time a  thrombectomy as well as stent placement was done in the left circumflex.  The patient was also noted to have other atherosclerotic cardiovascular  disease x3 vessel.  Specifically in the small RCA and LAD.  By March 05, 2005  the patient was doing well with no complaints.  Cardiology evaluated the  patient and felt he was medically stable for discharge.   DISCHARGE INSTRUCTIONS:  He was discharged on aspirin and Plavix with plans  for followup appointment in 2-3 weeks.  In addition, plans will be to follow  to check his lipids and outpatient.  The patient will also need outpatient  Adenosine stress in 3-4 weeks for his other coronary artery disease.  In  addition, the patient will followup with PCP in the next 2-4 weeks.  The  patient is also advised to stop smoking.  He tells me that he quit as soon  as this happened and he will no longer smoke again.  His activity will be no  heavy exertional activity and no  strenuous lifting.  He is being discharged  on a heart healthy diet and being discharged to home and his overall  disposition has improved.  In addition, he also received a medication while  here for sleep and will be given a prescription for Ambien at home.  He is  advised after taking this to do no driving.       SKK/MEDQ  D:  03/05/2005  T:  03/05/2005  Job:  [id number]   cc:   Francisca December, M.D.  [address]  [address]  [place]  [place] [address]  Fax: [phone]

## 2011-02-08 NOTE — H&P (Signed)
NAME:  MAKAYLA, CONFER NO.:  [id number]   MEDICAL RECORD NO.:  [id number]          PATIENT TYPE:  INP   LOCATION:  1829                         FACILITY:  [facility]   PHYSICIAN:  Ulyses Amor, MD DATE OF BIRTH:  06/27/1946   DATE OF ADMISSION:  03/20/2005  DATE OF DISCHARGE:                                HISTORY & PHYSICAL   [name] is a 65 year old white man who is again admitted to [facility] for further evaluation of chest pain.   The patient has a history of coronary artery disease, having undergone stent  placement in the LAD in 1998. On February 28, 2005 he was admitted here at [facility] for an acute non ST segment elevation myocardial infarction.  He subsequently underwent thrombectomy and stenting of the circumflex. He  was discharged on March 05, 2005. His course was uncomplicated until this  evening when he began to experience chest pain approximately 8 hours ago  while watching television. The chest pain was similar to that which heralded  his acute myocardial infarction 3 weeks ago. Chest pain was described as a  pressure in the left anterior chest. It radiated to the left arm. It was  associated with dyspnea but no diaphoresis or nausea. There were no  exacerbating or ameliorating factors. It appeared not to be related to  position, activity, meals, or respiration. He did not take any  nitroglycerin.  He ultimately presented to the emergency department where  appropriately treatment was initiated. His chest pain has largely, though  not completely, resolved. The total duration of chest pain, therefore, has  been approximately 8 hours thus far.   The patient has no history of congestive heart failure. His ejection  fraction was normal at the time of his recent cardiac catheterization. There  was no history of arrhythmia.   The patient has a history of smoking. He currently smokes 3 cigarettes per  day. There is no history of  diabetes mellitus, dyslipidemia, or  hypertension. There is a family history of early coronary artery disease.   CURRENT MEDICATIONS:  1.  Plavix 75 mg p.o. daily.  2.  Aspirin 325 mg p.o. daily.   MEDICATION ALLERGIES:  CODEINE, TETRACYCLINE, TYLENOL, AND VICODIN.   OPERATIONS:  Ulnar repair.   PAST MEDICAL HISTORY:  Notable for two strokes, one in 2000 and one in 2001.  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   SOCIAL HISTORY:  The patient lives with his wife. He is retired. He does not  use alcohol. He smokes as described above.   FAMILY HISTORY:  Notable for early coronary artery disease.   REVIEW OF SYSTEMS:  Reveals no new problems related to his head, eyes, ears,  nose, mouth, throat, lungs.  Gastrointestinal system, genitourinary system, or extremities. There is no  history of neurologic or psychiatric disorder. There is no history of fever,  chills, or weight loss.   PHYSICAL EXAMINATION:  VITAL SIGNS: Blood pressure 105/42. Pulse 58 and  regular. Respirations 20. Temperature 96.9.  GENERAL:  The patient was a middle-aged white man in no discomfort. He was  alert,  oriented, appropriate, and responsive.  HEENT:  Head, eyes, nose, and mouth were normal.  NECK:  Without thyromegaly or adenopathy. Carotid pulses were palpable  bilaterally and without bruits.  CARDIAC EXAM:  Reveals a normal S1 and S2. There was no S3, S4, murmur, rub,  or click. Cardiac rhythm was regular. No chest wall tenderness was noted.  LUNGS: Clear.  ABDOMEN:  Soft and nontender. There was no mass, hepatosplenomegaly, bruit,  distention, rebound, guarding, or rigidity. Bowel sounds were normal.  RECTAL/GU:  Examinations were not performed as they were not pertinent to  the reason for acute care hospitalization.  EXTREMITIES:  Without edema, deviation, or deformity. Radial and dorsalis  pedis pedal pulses were palpable bilaterally.  BRIEF SCREENING  NEUROLOGICAL SURVEY:  Was unremarkable.   Electrocardiogram reveals normal sinus rhythm with occasional PVCs. There  were no changes specific for ischemia or infarction. The chest radiograph,  according to the radiologist, demonstrated no evidence of acute disease.  White count was 7800 with a hemoglobin of 13.9, and hematocrit of 40.2. His  potassium was 3.9, BUN 12, creatinine 1.0. The initial set of cardiac  markers revealed a myoglobin of 40.1, CK-MB less than 1.0, and troponin less  than 0.05. The remaining studies are pending at the time of this dictation.   IMPRESSION:  1.  Unstable angina.  2.  Coronary artery disease, 3-vessel. Status post myocardial infarction in      1998 followed by an LAD stent. Status post myocardial infarction February 28, 2005 followed by circumflex thrombectomy and stent.  3.  Status post cerebrovascular accident x2. One was due to emboli from a      mitral valve vegetation.  4.  Status post left ulnar nerve repair.   PLAN:  1.  Cardiac step down.  2.  Serial cardiac enzymes.  3.  Aspirin.  4.  Intravenous heparin.  5.  Plavix.  6.  Intravenous nitroglycerin.  7.  No beta blocker as heart rate has been running in the 40's and 50's      since presenting to the emergency      department.  8.  Discontinuation of smoking discussed.  9.  Further measures per Dr. Amil Amen.       MSC/MEDQ  D:  03/20/2005  T:  03/20/2005  Job:  [id number]   cc:   Francisca December, M.D.  [address]  [address]  [place]  [place] [address]  Fax: [phone]

## 2011-02-08 NOTE — Consult Note (Signed)
[name], SPRATLEY NO.:  [id number]   MEDICAL RECORD NO.:  [id number]          PATIENT TYPE:  INP   LOCATION:  3735                         FACILITY:  [facility]   PHYSICIAN:  Francisca December, M.D.  DATE OF BIRTH:  Nov 05, 1945   DATE OF CONSULTATION:  03/01/2005  DATE OF DISCHARGE:                                   CONSULTATION   REASON FOR CONSULTATION:  Chest pain and positive cardiac enzymes.   HISTORY OF PRESENT ILLNESS:  [name] is a 65 year old man who  presented to [facility] Room yesterday with severe and ongoing  anterior substernal chest pain.  This was associated with bilateral arm  numbness, nausea, vomiting, and diaphoresis.  The initial EKG in the  emergency room  was normal as were two sets of point of care enzymes.  He  was admitted for rule out myocardial infarction and observation.  His first  set of enzymes obtained on the board at approximately midnight were positive  for elevated CK MB and troponin.  The pain persisted in a fairly severe  fashion until around 2200 yesterday and then slowly resolved overnight,  although at the time of my evaluation at 11 a.m., he continues to have mild  anterior substernal chest discomfort.  He states it is nothing as severe as  what he had yesterday.  He has not had any recent chest pain until  yesterday.  He does carry a diagnosis of CAD with an apparent stent  placement in a main artery placed in 1998 at a hospital in [place],  [place].  He states he had an apparent MI at the time.  At the time he was  admitted to the hospital, he was treated only with aspirin.   PAST MEDICAL HISTORY:  1.  Coronary artery disease status post myocardial infarction with apparent      stent, questionable x 3, in 1998.  2.  History of CVA x 2 in 2000 and 2001, one apparently secondary to an      embolic source from mitral valve prolapse, questionable SBE at the time.  3.  Ongoing tobacco use, quit six  days ago.  4.  History of left ulnar nerve repair.   OUTPATIENT MEDICATIONS:  Aspirin only.   FAMILY HISTORY:  Positive for early coronary disease.   SOCIAL HISTORY:  He is married, he currently smokes three cigarettes a day,  he does not drink any alcohol.   ALLERGIES:  No known drug allergies.   REVIEW OF SYMPTOMS:  Negative except for HPI with the residual bilateral  upper extremity weakness and left foot drag secondary to CVA.   PHYSICAL EXAMINATION:  VITAL SIGNS:  Blood pressure 100/51, pulse 58 and regular, respirations 18,  temperature 97.9, O2 saturation 95% on 2 liters.  GENERAL:  This is a well appearing 65 year old man in no distress.  HEENT:  Unremarkable.  Head is normocephalic, atraumatic.  Pupils are equal,  round, reactive to light and accommodation.  Extraocular movements intact.  Oral mucosa is pink and moist.  Tongue is not coated.  NECK:  Supple without thyromegaly or masses.  The carotid upstrokes are  normal.  There is no bruit.  There is no jugular venous distention.  CHEST:  Clear with adequate excursion.  HEART:  Regular rhythm, normal S1 and S2 heard, no S3, S4, murmur, click, or  rub noted.  ABDOMEN:  Soft, nontender, without midline pulsatile mass.  Bowel sounds are  present in all quadrants.  EXTREMITIES:  Full range of motion.  No edema.  Intact distal pulses.  NEUROLOGICAL:  Cranial nerves 2-12 intact.  Sensory grossly intact.  Motor  does show some dorsiflexion and weakness of the left foot.  Gait is not  tested.  SKIN:  Warm, dry, and clear.   ACCESSORY CLINICAL DATA:  As mentioned, initial CK MB and troponin in the  emergency room  was negative.  Then, at 2300, his CK MB had risen to 30.6  with a troponin 2.45.  Subsequent CK MB was 32.3 with troponin 5.42.  This  was around 8 a.m. this morning.  Serum electrolytes, BUN, creatinine, and  glucose are normal.  Admission hemogram is normal.  Electrocardiogram is  normal sinus rhythm, normal  EKG.   IMPRESSION:  1.  Non-ST segment elevation myocardial infarction now approximately 20      hours into this event.  Likely, circumflex marginal disease given the      absence of ECG changes.  2.  History of CAD single vessel with stent placed, will try to obtain      records.  3.  History of CVA, apparent embolic phenomenon, will try to obtain records      from [facility].  4.  History of tobacco abuse, encouraged discontinuation.   PLAN:  I have discussed the need for cardiac catheterization, coronary  angiography, and possible PCI with the patient.  This would be for further  diagnosis and to allow for further therapeutic options.  The patient states  an understanding, has had his questions answered, and wished to proceed.  We  will begin IV heparin and IV nitroglycerin, consider Plavix once angiography  completed.       JHE/MEDQ  D:  03/01/2005  T:  03/01/2005  Job:  [id number]

## 2011-02-08 NOTE — H&P (Signed)
NAMEPEIRCE, DEVENEY NO.:  [id number]   MEDICAL RECORD NO.:  [id number]          PATIENT TYPE:  EMS   LOCATION:  MAJO                         FACILITY:  [facility]   PHYSICIAN:  Hollice Espy, M.D.DATE OF BIRTH:  July 14, 1946   DATE OF ADMISSION:  02/28/2005  DATE OF DISCHARGE:                                HISTORY & PHYSICAL   ATTENDING PHYSICIAN:  Dr. Renford Dills.   PRIMARY CARE PHYSICIAN:  Vincient Vanaman. Manus Gunning, M.D.   CARDIOLOGIST:  Ricki Rodriguez, M.D.   CHIEF COMPLAINT:  Chest pain.   Patient is a 65 year old white male with extensive past medical history,  including CAD, status post stent placement x3 in 1998, embolic CVA x2  separate episodes secondary to vegetations from a valve in 2001, who  presents with an episode of sudden onset of chest pain.  The patient tells  me that he has been feeling well with no problems when today, he started  having some severe chest pain located in the midsternal region and radiating  to both sides of his chest.  The pain was intense and described as a sharp  10/10.  He is also complaining of some associated left arm numbness and  right hand numbness.  He felt short of breath and was also unable to take a  deep breath with this.  He came to the emergency room for further  evaluation.  EKG done in the ER showed sinus rhythm and occasional PVCs and  a questionable ST abnormality, although this is unremarkable to me.  Other  labs of concern on this patient were an elevated white count of 15.1 with a  90% shift.  The patient was also found to have a glucose of 153, an elevated  D-dimer of 1.14, and an initial normal set of cardiac enzymes with the  second set being slightly elevated at 286.  Patient's creatinine was also  elevated at 1.6.  The patient received nitroglycerin x2 doses as well as  morphine and aspirin.  Began to feel a little bit better, although pain  continued to be severe.  A few hours later when I saw him,  still complaining  of some chest pain described as sharp and stabbing.  He was tender to  palpation.  After the elevated D-dimer returned back, the patient was sent  for a CT scan of the chest, which was found to be unremarkable for PE or  dissection.  Currently, the patient states that his pain is better.  He  denies any headaches or vision changes.  He denies any dysphagia.  He denies  any acute onset of arm or leg weakness.  He denies any wheezing or coughing.  He denies any abdominal pain.  No hematuria, dysuria, constipation, or  diarrhea.   PAST MEDICAL HISTORY:  1.  An episode of similar chest pain in January, 2004, where at that time,      he was discharged from the emergency room, which was felt to be      musculoskeletal.  2.  History of CAD, status post stent placement x3.  3.  History of CVA, once apparently in 2000 and once in 2001 with the second      one being diagnosed as the cause of the vegetation on his valve, which      his wife tells me was treated with antibiotics alone.  Both the stent      placement as well as this embolic CVA and secondary treatment were done      at [facility].  4.  The patient also has residual effects of very mild weakness in both      upper extremities and occasional dragging left foot from the CVA.  5.  History of tobacco abuse.  6.  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   MEDICATIONS:  Patient is only on aspirin daily.  He tells me he has never  been on any cholesterol medicines.  They checked it in 1998 and was told it  was okay.  He tells [place] he is not on any hypertension medications.  Blood  pressure is followed and has always been excellent.   Patient has no known drug allergies.   SOCIAL HISTORY:  He admits to smoking three cigarettes a day.  He denies any  drug use or alcohol use.   FAMILY HISTORY:  Noted for CAD with MI at a young age.   PHYSICAL EXAMINATION:  VITALS ON ADMISSION:  Temp 97.4,  heart rate 87, but  since then has come down significantly, down to the 50s, blood pressure  126/71.  Respirations initially 40.  Down to about 18.  O2 sat is 95% on 2  liters.  GENERAL:  Patient is alert and oriented x3 in no apparent distress.  HEENT:  Normocephalic and atraumatic.  His mucous membranes are moist.  NECK:  No carotid bruits.  LUNGS:  Clear to auscultation bilaterally.  HEART:  Regular rate and rhythm.  S1 and S2.  ABDOMEN:  Soft and nontender.  Nondistended.  Positive bowel sounds.  EXTREMITIES:  No clubbing or cyanosis.  Trace pitting edema.   LAB WORK:  White count 15.1, H&H 16.9 and 49.3.  MCV 92.  Platelet count 261  with a 90% shift.  Sodium 139, potassium 3.7, chloride 106, bicarb 22, BUN  20, creatinine 1.6, glucose 153.  CPK first at 166 and 1.5.  Troponin I less  than 0.05.  D-dimer is 1.14, which is elevated.   EKG shows, again, sinus rhythm with occasional PVCs.  Some questionable ST  abnormality.  I cannot appreciate this.  I have compared this to a previous  EKG done in April, 2004.  At that time, it looks essentially unchanged,  although there appears to be some changing of the isoelectric point but  otherwise unremarkable.  This may be a lead placement issue.   The patient's CT of his chest is negative for PE or pulmonary embolus.   Chest x-ray is unremarkable for acute cardiopulmonary process.   ASSESSMENT/PLAN:  1.  Chest pain of unclear etiology:  I do not think that this is ischemia in      nature, although he has quite an extensive history and is not on any      medications such as beta blocker, ACE inhibitor, other than an aspirin.      Will check two more sets of cardiac enzymes and follow.  Will not      immediately go for a stress test but will discuss it.  Will ask Dr.  Algie Coffer to make some additional recommendations.  2.  Increased white count:  Initially, I was curious if the patient may have     pericarditis, given his elevated  white count, his severe chest pain with      tenderness, and the fact that although I did not mention this above, is      worse when leaning forward.  It appears to have eased off on its own.  I      am reluctant to treat him with anti-inflammatories or steroids such as      Toradol, given his renal insufficiency and possible infection.  Would      watch this.  Perhaps this is secondary to stress.  In addition, will      also check a urinalysis.  Will also go ahead and check a urine drug      screen to rule out other causes as well.  This may be stress-related.      The patient is not currently spiking a fever.  3.  Renal insufficiency:  This likely may be from the patient's chronic      NSAIDs use, although he only takes a low-dose aspirin.  He tells me he      does not take more than that.  It may be from hypertension or diabetes      that has not been treated.  4.  History of coronary artery disease with history of stent x3.  5.  History of cerebrovascular accident secondary to embolism from his      valve.  He does not appear to have any severe deficits.  There is no      reason to think that he has had an acute cerebrovascular accident at      this time.  6.  Bradycardia:  Patient initially came in with a blood pressure of 126/71      and a heart rate of 87 with respirations of 40.  At that time, he may      have been in severe pain.  Later when this has eased back, his heart      rate is down to 56 and his blood pressure is 104/61.  It is possible      that this may not be bradycardia, this may be patient's resting blood      pressure and heart rate now that the pain had eased off.  7.  Increased capillary blood glucoses with a blood glucose of 156.  We will      go ahead and check an A1C with the next blood draw as well as check a      fasting blood sugar in the morning.  Certainly, this is an unusual      presentation for any type of ischemia or even pericarditis, and the       patient needs very close monitoring to better determine the exact      etiology of this problem.   I had a discussion with the patient.  He has not seen a cardiologist in many  years.       SKK/MEDQ  D:  02/28/2005  T:  02/28/2005  Job:  [id number]   cc:   Ricki Rodriguez, M.D.  [address]  [place] [address]  Fax: [phone]   Bryan Lemma. Manus Gunning, M.D.  [address] [place]  [place] [address]  Fax: [phone]

## 2011-02-08 NOTE — Cardiovascular Report (Signed)
NAME:  DAMONI, CAUSBY NO.:  [id number]   MEDICAL RECORD NO.:  [id number]          PATIENT TYPE:  INP   LOCATION:  6599                         FACILITY:  [facility]   PHYSICIAN:  Francisca December, M.D.  DATE OF BIRTH:  18-Oct-1945   DATE OF PROCEDURE:  03/04/2005  DATE OF DISCHARGE:                              CARDIAC CATHETERIZATION   PROCEDURE PERFORMED:  Percutaneous coronary intervention/drug-eluting stent implantation, mid-left  circumflex, with adjunctive AngioJet thrombectomy.   INDICATIONS:  [name] a 65 year old man who experienced a non-ST-  segment elevation myocardial infarction on Thursday of last week, which was  February 28, 2005.  He underwent coronary angiography on March 01, 2005, which  revealed a moderately tight stenosis in the mid-left circumflex with a  large, nonmobile thrombus in the proximal circumflex.  He has been treated  with intravenous heparin the left approximately 72 hours.  He is returned to  the catheterization laboratory at this time to assess the therapeutic  effects of this prolonged heparin infusion and undergo intervention as  appropriate.   PROCEDURAL NOTE:  The patient is brought to the cardiac catheterization  laboratory in the fasting state.  The right groin was prepped and draped in  the usual sterile fashion.  Local anesthesia was obtained with infiltration  of 1% lidocaine.  A 6-French catheter sheath was inserted percutaneously  into the right femoral artery utilizing an anterior approach over a guiding  J-wire.  A 6-French 3.5 Voda left guiding catheter was advanced to the  descending aorta, where the left coronary os was engaged.  The patient then  received bivalrudin at 25 mcg/kg bolus and then a constant infusion of 0.75  mg/kg per hour.  Resultant ACT was 391 seconds.  A 0.014-inch Scimed Luge  intracoronary guidewire was passed across the lesion in the proximal  circumflex without difficulty.  The patient then  underwent AngioJet  thrombectomy in the standard fashion.  Two runs were obtained, from the  second was longer as I did not seem to lyse much thrombus on the first run.  Toward the completion of the second run, the patient developed a prolonged  period of asystole.  He spontaneously recovered after about 15 seconds.  There were no sequelae.  We continued with the procedure.  The second run of  the AngioJet thrombectomy did reduce the size of the thrombus significantly.  The lesion was then stented using a 3.0/20 mm Scimed Taxus intracoronary  drug-eluting stent.  This was deployed at peak pressure of 14 atmospheres  for approximately one minute.  The stent balloon was deflated and removed  and a 3.5/8 mm Scimed Quantum intracoronary balloon was advanced in the more  proximal portion of the stent where the more proximal portion of the  circumflex was wider, and it was inflated there to a peak pressure of 20  atmospheres.  This was for 25 seconds.  This balloon was deflated and  removed.  The wire was then withdrawn and directed down a large marginal  branch.  The ostium of the marginal branch, which was in stent jail  was  dilated with 2.5/15 mm Scimed Maverick for at 4 atmospheres for 60 seconds.  This balloon was deflated and removed as well as the wire was withdrawn and  advanced down the main circumflex once again.  The 3.5 x 8 mm Quantum  Maverick was readvanced into the stented segment and inflated at the origin  of the marginal branch to 12 atmospheres for 20 seconds.  Following the  confirmation of adequate patency in orthogonal views both with and without  the guidewire in place, the guiding catheter was removed.  A right femoral  arteriogram in the 45-degrees angulation via the catheter sheath by hand  injection was performed and confirmed adequate anatomy for placement of the  percutaneous closure device, Angio-Seal.  This was successfully deployed  with good hemostasis and an  intact distal pulse.   ANGIOGRAPHY:  As mentioned, the lesion treated was in the midportion of the  left circumflex between the first and second marginal branches, which were  large.  The thrombus was in the more proximal segment of the marginal  branch.  Following balloon dilatation and stent implantation, there was no  residual stenosis.  There was some evidence of stent jail in the first marginal branch.  This  was the spot treated with balloon dilatation only.  There was not much  improvement following the balloon dilatation.   FINAL IMPRESSION:  1.  Atherosclerotic cardiovascular disease, three-vessel.  2.  Status post successful drug-eluting stent implantation with adjunctive      intracoronary thrombectomy.  3.  Typical angina was not reproduced with device insertion or balloon      inflation.       JHE/MEDQ  D:  03/04/2005  T:  03/04/2005  Job:  [id number]   cc:   Bryan Lemma. Manus Gunning, M.D.  [address] [place]  [place] [address]  Fax: [phone]

## 2011-02-08 NOTE — H&P (Signed)
[place]  Patient:    GLENWOOD, REVOIR Visit Number: [id number] MRN: [id number]          Service Type: SUR Location: [facility] 3172 02 Attending Physician:  Colon Branch Dictated by:   Clydene Fake, M.D. Admit Date:  07/16/2001                           History and Physical  CHIEF COMPLAINT:  Back and right leg pain.  HISTORY OF PRESENT ILLNESS:  The patient is a 65 year old, right-handed gentleman who has had four prior back operations, one with a fusion in 54 or 1991.  He has had intermittent back pain and some leg problems since.  A year and a half ago, he was worked up with MRI because of back and right leg pain. Things improved a little after that, but over the last six months the pain has been getting worse.  It is almost constant, radiating from his back to the right hip down the right leg to anterior thigh and occasionally the top of the foot with L4 and maybe L5 components.  He has noticed some weakness, especially in the quadriceps in the right leg, and some numbness and tingling in the same distributions.  All of this has been worsening over the last few months.  He has been on nonsteroidal anti-inflammatories and this has not helped.  He does not have any leg problems and no bowel or bladder complaints. He has undergone x-rays of the lumbar spine showing spondylolisthesis at L3-4 and L4-5 with abnormal motion with flexion and extension at both of those levels.  A new MRI was done showing extensive postoperative changes and canal stenosis at L3-4 and severe degenerative disk disease at L3-4 and L4-5.  A CT was also done, showing the prior laminectomy from L3-S1, but stenosis at the L3-4 level and attempt at posterolateral fusion can be seen posterolaterally, but the patient is not fused.  The patient will be admitted for decompression and fusion of the lumbar spine.  PAST MEDICAL HISTORY:  Significant for MI in 1996, stroke, and  heart disease. He has had a recent stress test.  He does not have any chest pain.  PAST SURGICAL HISTORY:  Lumbar laminectomies in 1985, 1989, 1990, and 1991. Shoulder surgery in 1988.  Colon resection in 1987.  Three hernia surgeries in the 1960s.  Appendectomy in 1987.  A stent in his coronary artery placed in 1996.  CURRENT MEDICATIONS: 1. Coumadin, which he stopped a week prior to surgery. 2. An aspirin a day, which he also stopped prior to surgery. 3. Lorazepam. 4. Naprosyn.  DRUG ALLERGIES:  CODEINE, TETRACYCLINE, and possibly TYLENOL.  SOCIAL HISTORY:  He is retired.  He smokes one pack per day, but quit smoking about two months ago.  He does not drink alcohol.  He is married.  REVIEW OF SYSTEMS:  Otherwise negative.  FAMILY HISTORY:  Noncontributory.  PHYSICAL EXAMINATION:  The patient is pleasant and in no apparent distress.  WEIGHT:  212 pounds.  HEIGHT:  6 feet.  VITAL SIGNS:  Blood pressure 137/89, pulse 80, temperature 97.1 degrees.  HEENT:  Unremarkable.  NECK:  Supple.  LUNGS:  Clear.  HEART:  Regular rate and rhythm.  ABDOMEN:  Soft and nontender.  EXTREMITIES:  Intact.  No edema.  BACK:  Range of motion is decreased in extension.  Right lateral bending brings on radicular symptoms.  There are some  paraspinal muscle spasms noted.  NEUROLOGIC:  Gait is done fairly normally.  Straight leg raising is positive on the right for L4 radicular symptoms and negative on the left.  Motor strength shows 4+/5 strength in right quadriceps, otherwise 5/5 strength in all muscle groups.  Sensation slightly decreased in the entire right lower extremity and left intact.  The deep tendon reflexes are diminished at the left knee, 2 at the right knee, and a little decreased at the ankles bilaterally.  ASSESSMENT AND PLAN:  A patient with spinal stenosis at multiple prior surgeries and even attempted fusion.  The patient will be admitted for redo decompressive  laminectomy at L3-4 and L4-5 with pedicle screw fixation and possible posterior interbody fusion with interbody cages at these two levels. Potential posterolateral fusion.  Potential iliac crest graft harvest. Potential fusion even higher than L3-4 and maybe down into and including L5-S1 depending on intraoperative findings. Dictated by:   Clydene Fake, M.D. Attending Physician:  Colon Branch DD:  07/15/01 TD:  07/16/01 Job: [id number]

## 2011-02-08 NOTE — Cardiovascular Report (Signed)
[name], [name] NO.:  [id number]   MEDICAL RECORD NO.:  [id number]          PATIENT TYPE:  INP   LOCATION:  2906                         FACILITY:  [facility]   PHYSICIAN:  Francisca December, M.D.  DATE OF BIRTH:  Dec 21, 1945   DATE OF PROCEDURE:  03/21/2005  DATE OF DISCHARGE:                              CARDIAC CATHETERIZATION   PROCEDURES PERFORMED:  1.  Left heart catheterization.  2.  Coronary angiography.  3.  Left ventriculogram.  4.  PCI/drug-eluting stent implantation mid-LAD.   INDICATIONS:  [name] is a 65 year old man who is now approximately  two and a half weeks status post PCI/stent implantation left circumflex  following presentation with a non-Q-wave myocardial infarction. He was  readmitted yesterday morning with prolonged chest pain. He was ruled out for  recurrent myocardial infarction. He is returned to the catheterization  laboratory at this time to identify whether there is early stent failure in  the left circumflex and proceed with further revascularization. 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   PROCEDURE NOTE:  The patient is brought to the cardiac catheterization  laboratory in the fasting state. The right groin was prepped and draped in  the usual sterile fashion. Local anesthesia was obtained with infiltration  of 1% lidocaine. A 6-French catheter sheath was inserted percutaneously into  the right femoral artery utilizing an anterior approach over a guiding J-  wire. A 110 cm pigtail catheter was used to measure pressures in ascending  aorta and in the left ventricle both prior to and following the  ventriculogram. A 30-degree RAO cine left ventriculogram was performed  utilizing a power injector. Coronary angiography was then performed using a  6-French #4 left and right Judkins catheters.  Cineangiography of each  coronary artery was conducted in multiple LAO and RAO projections. All  catheter manipulations were performed using fluoroscopic observation and  exchanges performed over a long guiding J-wire.   We then proceeded with coronary intervention. The patient received a 0.75  mg/kg bolus of Angiomax followed by a 1.75 mg/kg/hr constant infusion. He  has been chronically on Plavix. The resultant ACT was greater than 300  seconds. A 6-French #3.5 CLS guiding catheter was advanced to the ascending  aorta where the left coronary os was engaged. A 0.014 inch Luge  intracoronary guidewire was passed across the lesion in the anterior  descending artery without difficulty. A 3.0/13 mm Cypher drug-eluting stent  was then advanced into place, carefully positioned, and then deployed at a  peak pressure of 14 atmospheres for approximately 1 minute. This stent  balloon was deflated and removed and a 3.25/8 mm Scimed Quantum Maverick  intracoronary balloon was deployed carefully within the stent. It was  inflated to a peak pressure of 16 atmospheres for 45 seconds. This balloon  was deflated and removed, and following confirmation of adequate patency in  orthogonal views both with without the guidewire in place, the guiding  catheter  was removed. A right femoral arteriogram in the 45 degree RAO  angulation via the catheter sheath revealed adequate anatomy for placement  of the percutaneous closure device Angio-Seal. This was subsequently  deployed successfully with good hemostasis and an intact distal pulse.   HEMODYNAMIC RESULTS:  1.  Systemic arterial pressure was 141/79 with a mean of 102 mmHg. There was      no systolic gradient across the aortic valve. The left ventricular end-      diastolic pressure was 26 mmHg preventriculogram.  2.  Angiography:      1.  The left ventriculogram demonstrated normal chamber size and normal          global systolic function with focal  posterior wall severe          hypokinesis. The overall visually estimated ejection fraction is          60%. There is no significant mitral regurgitation. There is no          significant coronary calcification.      2.  There is a left dominant coronary system present. The main left          coronary was normal.      3.  Left anterior descending artery and its branches are highly          diseased; there is a focal 80% stenosis in the mid portion at the          origin of a moderate to large size diagonal branch. The ongoing          anterior descending reaches and barely traverses the apex and is          without significant disease.      4.  The left circumflex coronary artery and its branches are highly          diseased; the stented segment in the proximal portion is widely          patent. The stent extends into the second marginal branch. The          ongoing circumflex is small and gives only two small left          ventricular branches at the base. There is a first marginal branch          that is stent jail and a resultant 80% stenosis is present in the          ostium.      5.  The right coronary artery and its branches are highly diseased; the          vessel is 90% occluded in the mid portion with the above-mentioned          aneurysmal formation. The distal flow is somewhat diminished. The          distal portion of the artery, though, is quite small. It gives rise          to a large right ventricular branch and then a very small acute          marginal branch, and then the ongoing vessel is trivial in size          practically.      6.  Collateral vessels are not seen.      7.  Following balloon dilatation and drug-eluting stent implantation in          the mid anterior descending artery, there is no residual stenosis.   FINAL IMPRESSION:  1.  As described, cardiovascular disease, three-vessel. 2.  Status post successful PCI/drug-eluting stent implantation, mid  anterior      descending artery.  3.  Typical angina was not reproduced with device insertion or balloon      inflation.       JHE/MEDQ  D:  03/21/2005  T:  03/21/2005  Job:  [id number]   cc:   Suzzette Righter, M.D.

## 2011-02-08 NOTE — Cardiovascular Report (Signed)
NAMEKEMAL, [name] NO.:  [id number]   MEDICAL RECORD NO.:  [id number]          PATIENT TYPE:  INP   LOCATION:  3735                         FACILITY:  [facility]   PHYSICIAN:  Francisca December, M.D.  DATE OF BIRTH:  12-17-1945   DATE OF PROCEDURE:  03/01/2005  DATE OF DISCHARGE:                              CARDIAC CATHETERIZATION   PROCEDURE PERFORMED:  1.  Left heart catheterization.  2.  Left ventriculogram.  3.  Coronary angiography.   INDICATION:  [name] is a 65 year old man who presented to [facility] with prolonged chest pain yesterday afternoon.  He was ruled in for  a non-ST-segment elevation myocardial infarction.  He has a history of  previous coronary disease with apparent myocardial infarction and stent  placed in the LAD in 1998 at a [facility] in [place].  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  He is brought to  the catheterization laboratory at this time to identify the extent of  disease and provide for further therapeutic options.   PROCEDURAL NOTE:  The patient was brought to the cardiac catheterization  laboratory in the fasting state.  The right groin was prepped and draped in  the usual sterile fashion.  Local anesthesia was obtained with the  infiltration of 1% lidocaine.  A 6 French catheter sheath was inserted  percutaneously into the right femoral artery utilizing an anterior approach  over a guiding J wire.  A 110-cm pigtail catheter was used to measure  pressures in the ascending aorta and in the left ventricle both prior to and  following the ventriculogram.  A 30-degree RAO cine left ventriculogram was  performed utilizing a power injector; 45 mL were injected at 13 mL per  second.  Following the sublingual administration of 0.4 mg nitroglycerin,  cineangiography of the left coronary artery was conducted in  multiple LAO  and RAO projections utilizing a 6 [place] #4 left Judkins catheter.  A 6  French #4 left Judkins catheters was then exchanged for a 6 [place] #4 right  Judkins catheter.  Cineangiography of the right coronary artery was  conducted in LAO and RAO projections.  The patient then received 0.2 mg of  intracoronary nitroglycerin via the right coronary catheter into the right  coronary and cineangiography was repeated in the LAO projection.  The right  coronary catheter was then exchanged again for the 6 French #4 left Judkins  catheter.  Final cineangiogram of the left coronary artery was conducted in  a shallow RAO steep caudal angulation on 30 frames per second.  The left  Judkins catheter was then removed, and a right femoral arteriogram in the 45-  degree RAO angulation was performed via the catheter sheath by hand  injection.  It documented adequate anatomy for placement of the percutaneous  closure device Angio-Seal.  This was deployed successfully with good  hemostasis, and the patient was transported to the recovery area in stable  condition with an intact  distal pulse.   HEMODYNAMICS:  Systemic arterial pressure was 117/67 with a mean of 89 mmHg.  There was no systolic gradient across the aortic valve.  The left  ventricular end-diastolic pressure was 17 mmHg.   ANGIOGRAPHY:  The left ventriculogram demonstrated normal chamber size and  normal global systolic function with a focal mid diaphragmatic region of  severe hypo to akinesis.  The global systolic function is normal with a  visually estimated ejection fraction of 65%.  There is no mitral  regurgitation, and there is no coronary calcification seen, nor is there any  stented segments in the coronaries visible.   There is a right dominant coronary system present.  The main left coronary  artery is normal.   The left anterior descending artery and its branches are moderately  diseased.  The vessel contains a mid  focal 60-70% stenosis.  This is at the  origin of a moderate size single diagonal branch.  The ongoing anterior  descending artery reaches, but does not traverse the apex.   The left circumflex coronary artery and its branches are highly diseased.  The vessel gives rise to two large marginal branches and the ongoing  circumflex give rise to only a small posterior lateral branch which does not  leave the basal segment of the LV.  In the proximal portion before the  origin of the first marginal branch, there is an intraluminal lucency which  is relatively static, and then there is some haziness of the ongoing  circumflex before the origin of the second marginal branch.  The RAO  straight angiogram suggests some significant stenosis in this proximal  second marginal branch, but this is not seen in RAO caudal or steep caudal  projections.  However, the intraluminal thrombus in the proximal circumflex  is better visualized in the later angiograms.  This is especially true when  the final angiogram was taken with 30 frames per second.   The right coronary artery is relatively small and contains a mid vessel  aneurysm dilated segment with a 70-80% stenosis as the artery exits the  aneurysmal segment.  The artery exits the inferior one-third of the  aneurysm.  This is best seen on the LAO angiogram.  The distal vessel is  small.  It gives rise to a small posterior descending artery and a small  posterior lateral segment with one tiny left ventricular branch that  provides distal septal perforators.  The more proximal segment of the right  coronary artery is without any obstruction.   Collateral vessels are not seen.   FINAL IMPRESSION:  1.  Atherosclerotic coronary vascular disease, three vessel.  2.  Intact left ventricular size and global systolic function.  Ejection      fraction is 65%.  Focal regional wall motion abnormality is noted. 3.     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  This is likely the culprit lesion/artery for his      non-ST-segment elevation myocardial infarction.   PLAN/RECOMMENDATION:  I will treat the patient with intravenous heparin for  48-72 hours and then return to the catheterization laboratory for repeat  angiogram to assess the left circumflex thrombus.  If adequately removed  using the intravenous heparin, then would proceed with stenting of the  proximal circumflex into the second marginal branch.  If thrombus is still  present, then AngioJet thrombectomy would be in order prior to placement of  the stent.  At that time, I will also likely treat the LAD as this would be a very  straight forward lesion.  The right coronary artery would be quite  difficult.  It is relatively small, and would not treat unless he had  persistent symptoms following the above.       JHE/MEDQ  D:  03/01/2005  T:  03/01/2005  Job:  [id number]   cc:   Bryan Lemma. Manus Gunning, M.D.  [address] [place]  [place] [address]  Fax: [phone]

## 2011-02-08 NOTE — Op Note (Signed)
[place]  Patient:    [name], [name] Visit Number: [id number] MRN: [id number]          Service Type: SUR Location: 3000 [id number] Attending Physician:  Colon Branch Dictated by:   Clydene Fake, M.D. Proc. Date: 07/16/01 Admit Date:  07/16/2001                             Operative Report  PREOPERATIVE DIAGNOSES: 1. Lumbar stenosis. 2. Spondylolithesis 3. Instability. 4. Prior surgery.  PROCEDURES: 1. Decompressive laminectomy L2 through L5. 2. Posterior lumbar interbody fusion at L3-4 and L4-5. 3. Brantigan interbody cages at L3-4 and L4-5. 4. Segmented pedicle screw fixation L3, L3, L4, L5. 5. Open reduction and internal fixation of spondylolisthesis. 6. Posterolateral fusion L2 through L5. 6. Right iliac crest autograft harvest through separate incision. 7. Exploration of prior fusion.  SURGEON: Clydene Fake, M.D.  ASSISTANT:  Izell Elmont. Elesa Hacker, M.D.  ANESTHESIA:  General endotracheal tube anesthesia.  ESTIMATED BLOOD LOSS:  250 cc.  BLOOD GIVEN:  350 cc of Cell Saver blood was given.  No other blood.  DRAINS:  None.  COMPLICATIONS:  Dural tear.  A linear tear in the dura, and this was sutured closed with 4-0 Nurolon sutures.  REASON FOR PROCEDURE:  The patient is a 65 year old gentleman who has had four prior lumbar surgeries, including attempted fusion, who has had progressive symptoms of back pain and mainly right lower extremity pain, having progressive stenosis.  MRI and CT show stenosis of the lumbar spine with unstable spondylolisthesis at 3-4 and 4-5.  Patient brought in for decompression and fusion.  DESCRIPTION OF PROCEDURE:  The patient was brought in the operating room and general anesthesia was induced.  The patient was placed in the prone position on the Wilson frame with all pressure points padded.  The patient was prepped and draped in a sterile fashion at the site of incision, and the incision  was then made at the site of previous incision at the lumbar spine.  The incision needed to be extended cephalad slightly.  The incision was taken down to the fascia and hemostasis obtained with Bovie cauterization.  The fascia was incised with the Bovie and in the superior aspect we took the incision to the spinous process of L2.  Subperiosteal dissection was done over L2 and up to the bottom of L1 spinous processes and laminae.  We then dissected down through the scar tissue inferiorly and then worked out way out laterally to get to the facet margins and the lateral margins.  Dissection over the facets went down lateral, and we then found the transverse processes of L2, 3, 4, and 5.  We first did this to the left side and noted there was a fusion mass seen at L4-5, and that went further inferiorly, but we did not dissect that out. Even though the fusion mass was there, there was motion between 4 and 5 showing the nonunion.  We then dissected out the right side.  Here the fusion mass at 4-5 was definite nonunion with intermittent bone, fibrous tissue, and definitely active motion at the 4-5 segment.  There was even more motion at 3-4 where there was no prior fusion, and no stenosis.   After dissecting out the transverse processes out, we then decompressed the dura by finding the dura right at L2, the partial laminectomy bilaterally was performed, removing half of the L2 spinous  process and lamina.  We then found good dura, and we were able to work inferiorly from there, removing scar tissue.  There was a rim of bone around L3 causing a significant stenosis, and this was removed, and there was some scar tissue that was also causing scar tissue at the L3-4 level, a left side loose facet that was causing compression, and this was removed.  After decompressing the thecal sac, we then explored the foramen. At 4-5 and at 3-4 we then explored the disk space.  We first took 3-4, worked our way  through the scar tissue, and in working on the right side trying to get through scar tissue, there was a tear of the dura, and this was repaired primarily with 4-0 Nurolon sutures.  There was no further leakage of fluid after this.  The disk space was found at 3-4 and the disk space entered.  A diskectomy was performed.  Lateral disk material was obtained from the right side, decompressing the nerve root out laterally there.  We then prepared the interspace for Brantigan interbody cages, and bone that had been reamed at this point was cleaned and the [place] clay was added to this, and this bone was packed into two Brantigan cages.  We prepared the interspace by using interspace spreaders, distracted up to 9 mm, reduced the spondylolisthesis. We then used a broach to prepare the interspace while distracting on the opposite side, then placed bone into the interspace and then tapped the cages in, first on the left and then on the right.  Attention was then taken to obtaining an iliac crest graft because we had no more bone at this point in time.  Retractors were removed.  An incision was made over the right posterior iliac crest.  Incision taken down to the fascia and hemostasis obtained with Bovie cauterization.  The fascia was incised, and the iliac crest was exposed. Subperiosteal dissection was done and then using osteotomes, a section of iliac crest was removed.  We used some curettes and gouges to remove some more cortical bone.  We saved some blood which we added to allograft bone substitute to be used later in the case, and then we got hemostasis with bone wax at the bone edges.  This was irrigated out, antibiotic solution was used with some Gelfoam and thrombin at the bone defect and then sutured the fascia with 0 Vicryl interrupted sutures.  The subcutaneous tissue was closed with 0 and 2-0 Vicryl interrupted sutures.  The skin was closed with staples.  The autograft bone was then  chopped into small pieces, and this was what was used in the next cage that we used.  Attention was then taken back to the midline  lumbar spine and turned the right side.  We drilled through the attempted fusion, removed the facet on the right side, and found the disk space at 4-5. The 4-5 disk space was incised and diskectomy was performed with pituitary rongeurs.  We made sure we decompressed the nerve there on the right side.  We then prepared the interspace for a transverse interbody cage using spreaders and then the various broaches.  A 9 x 9 x 25 mm cage was then placed transversely at the right side of the 4-5 interspace.  We had good reduction of the spondylolisthesis with this.  At this point attention was taken to placing pedicle screws and decorticating the transverse process and lateral facets.  We did this side by side, level by  level, starting at L2 spinous process and lateral facet was decorticated, and the spot for entering the pedicle screw was found using fluoroscopic imaging as a guidance.  We used a drill and then an awl and a pedicle probe to enter the pedicle.  The pedicle was then tapped and then a 6.25 x 45 mm variable-axis screw was placed.  This process was repeated on the opposite side at L2 and then bilaterally at L3, L4, and L5.  Two rods were cut to length, bent, and placed to the heads of the pedicle screws, one on each side.  Locking nuts were then placed over the rod at each screw on each side.  We then tightened down the nuts at the L2 and 3 screws bilaterally and then with the compression across the 3-4 interspace, we then tightened the nut over the L4 screw with compression over 4-5.  We tightened the nut over the L5 screw and did this bilaterally.  After final tightening of all the nuts, AP and lateral fluoroscopic imaging was obtained showing good position of pedicle screws and rods, good reduction of the spondylolisthesis, good orientation of the  spine.  We did attempt to place on cross-connector up near L2-3.  We had some technical difficulties, and that was aborted.  The screws stripped on [place], and we decided not to use a cross-connector.  The area was irrigated with antibiotic solution and the autograft cut bone along with some allograft extra bone substitute was then placed for posterolateral fusion from L2 to L5, over the transverse processes and lateral facets and over the attempted previous fusion.  This was done bilaterally.  Retractors were removed.  We had good hemostasis.  Gelfoam and thrombin was placed over the dura.  The paraspinous muscles were closed with 0 Vicryl interrupted sutures.  The fascia was closed with the same.  The subcutaneous tissue was closed with 0, 2-0, and 3-0 Vicryl interrupted sutures and the skin closed with staples.  A dressing was placed over both incisions. The patient was placed back in the supine position and was awakened from anesthesia and extubated and transferred to the recovery room in stable condition. Dictated by:   Clydene Fake, M.D. Attending Physician:  Colon Branch DD:  07/16/01 TD:  07/19/01 Job: [id number]

## 2011-02-22 DIAGNOSIS — I214 Non-ST elevation (NSTEMI) myocardial infarction: Secondary | ICD-10-CM

## 2011-02-22 HISTORY — DX: Non-ST elevation (NSTEMI) myocardial infarction: I21.4

## 2011-03-04 ENCOUNTER — Inpatient Hospital Stay (HOSPITAL_COMMUNITY)
Admission: EM | Admit: 2011-03-04 | Discharge: 2011-03-06 | DRG: 282 | Disposition: A | Discharge status: Home / Self Care | Payer: Medicare Other | Attending: Cardiology | Admitting: Cardiology

## 2011-03-04 ENCOUNTER — Emergency Department (HOSPITAL_COMMUNITY): Payer: Medicare Other

## 2011-03-04 DIAGNOSIS — Z8673 Personal history of transient ischemic attack (TIA), and cerebral infarction without residual deficits: Secondary | ICD-10-CM

## 2011-03-04 DIAGNOSIS — F329 Major depressive disorder, single episode, unspecified: Secondary | ICD-10-CM | POA: Diagnosis present

## 2011-03-04 DIAGNOSIS — J449 Chronic obstructive pulmonary disease, unspecified: Secondary | ICD-10-CM | POA: Diagnosis present

## 2011-03-04 DIAGNOSIS — R079 Chest pain, unspecified: Secondary | ICD-10-CM

## 2011-03-04 DIAGNOSIS — F17200 Nicotine dependence, unspecified, uncomplicated: Secondary | ICD-10-CM | POA: Diagnosis present

## 2011-03-04 DIAGNOSIS — I10 Essential (primary) hypertension: Secondary | ICD-10-CM | POA: Diagnosis present

## 2011-03-04 DIAGNOSIS — Z888 Allergy status to other drugs, medicaments and biological substances status: Secondary | ICD-10-CM

## 2011-03-04 DIAGNOSIS — Z7902 Long term (current) use of antithrombotics/antiplatelets: Secondary | ICD-10-CM

## 2011-03-04 DIAGNOSIS — Z7982 Long term (current) use of aspirin: Secondary | ICD-10-CM

## 2011-03-04 DIAGNOSIS — Z9861 Coronary angioplasty status: Secondary | ICD-10-CM

## 2011-03-04 DIAGNOSIS — I2510 Atherosclerotic heart disease of native coronary artery without angina pectoris: Secondary | ICD-10-CM | POA: Diagnosis present

## 2011-03-04 DIAGNOSIS — J4489 Other specified chronic obstructive pulmonary disease: Secondary | ICD-10-CM | POA: Diagnosis present

## 2011-03-04 DIAGNOSIS — I252 Old myocardial infarction: Secondary | ICD-10-CM

## 2011-03-04 DIAGNOSIS — Z886 Allergy status to analgesic agent status: Secondary | ICD-10-CM

## 2011-03-04 DIAGNOSIS — I214 Non-ST elevation (NSTEMI) myocardial infarction: Principal | ICD-10-CM | POA: Diagnosis present

## 2011-03-04 DIAGNOSIS — E785 Hyperlipidemia, unspecified: Secondary | ICD-10-CM | POA: Diagnosis present

## 2011-03-04 DIAGNOSIS — F3289 Other specified depressive episodes: Secondary | ICD-10-CM | POA: Diagnosis present

## 2011-03-04 LAB — DIFFERENTIAL
Eosinophils Absolute: 0.1 10*3/uL (ref 0.0–0.7)
Eosinophils Relative: 1 % (ref 0–5)
Lymphs Abs: 1 10*3/uL (ref 0.7–4.0)
Monocytes Absolute: 0.6 10*3/uL (ref 0.1–1.0)
Monocytes Relative: 6 % (ref 3–12)

## 2011-03-04 LAB — CK TOTAL AND CKMB (NOT AT ARMC): CK, MB: 2.4 ng/mL (ref 0.3–4.0)

## 2011-03-04 LAB — CARDIAC PANEL(CRET KIN+CKTOT+MB+TROPI)
CK, MB: 18.5 ng/mL (ref 0.3–4.0)
Total CK: 231 U/L (ref 7–232)

## 2011-03-04 LAB — CBC
MCH: 32.2 pg (ref 26.0–34.0)
MCV: 92.1 fL (ref 78.0–100.0)
Platelets: 223 10*3/uL (ref 150–400)
RDW: 13.3 % (ref 11.5–15.5)

## 2011-03-04 LAB — BASIC METABOLIC PANEL
GFR calc non Af Amer: >60 mL/min (ref >60)
Glucose, Bld: 110 mg/dL — ABNORMAL HIGH (ref 70–99)
Potassium: 3.8 mEq/L (ref 3.5–5.1)
Sodium: 141 mEq/L (ref 135–145)

## 2011-03-05 DIAGNOSIS — I2510 Atherosclerotic heart disease of native coronary artery without angina pectoris: Secondary | ICD-10-CM

## 2011-03-05 LAB — BASIC METABOLIC PANEL
GFR calc Af Amer: >60 mL/min (ref >60)
GFR calc non Af Amer: >60 mL/min (ref >60)
Glucose, Bld: 102 mg/dL — ABNORMAL HIGH (ref 70–99)
Potassium: 4.1 mEq/L (ref 3.5–5.1)
Sodium: 141 mEq/L (ref 135–145)

## 2011-03-05 LAB — CBC
MCH: 31.3 pg (ref 26.0–34.0)
MCHC: 33.7 g/dL (ref 30.0–36.0)
MCV: 93 fL (ref 78.0–100.0)
Platelets: 184 10*3/uL (ref 150–400)
RDW: 13.6 % (ref 11.5–15.5)
WBC: 6.5 10*3/uL (ref 4.0–10.5)

## 2011-03-05 LAB — CARDIAC PANEL(CRET KIN+CKTOT+MB+TROPI)
CK, MB: 28.7 ng/mL (ref 0.3–4.0)
Relative Index: 9.9 — ABNORMAL HIGH (ref 0.0–2.5)

## 2011-03-06 DIAGNOSIS — I214 Non-ST elevation (NSTEMI) myocardial infarction: Secondary | ICD-10-CM

## 2011-03-06 LAB — BASIC METABOLIC PANEL
CO2: 26 mEq/L (ref 19–32)
Chloride: 111 mEq/L (ref 96–112)
Glucose, Bld: 94 mg/dL (ref 70–99)
Potassium: 4.3 mEq/L (ref 3.5–5.1)
Sodium: 144 mEq/L (ref 135–145)

## 2011-03-06 LAB — CBC
Hemoglobin: 13.8 g/dL (ref 13.0–17.0)
MCH: 31.7 pg (ref 26.0–34.0)
RBC: 4.36 MIL/uL (ref 4.22–5.81)
WBC: 5.8 10*3/uL (ref 4.0–10.5)

## 2011-03-06 NOTE — Cardiovascular Report (Signed)
[name], SKIBICKI NO.:  [id number]  MEDICAL RECORD NO.:  [id number]  LOCATION:  6531                         FACILITY:  [facility]  PHYSICIAN:  Verne Carrow, MDDATE OF BIRTH:  Jul 28, 1946  DATE OF PROCEDURE:  03/05/2011 DATE OF DISCHARGE:                           CARDIAC CATHETERIZATION   PROCEDURES PERFORMED: 1. Left heart catheterization 2. Selective coronary angiography. 3. Left ventricular angiogram.  OPERATOR:  Verne Carrow, MD  INDICATIONS:  Non-ST-elevation myocardial infarction in a patient with known coronary artery disease.  The patient had 1 episode of chest pain yesterday and presented to the emergency department.  He has been chest pain free for the last 18 hours.  His troponin was elevated and because of this a diagnostic catheterization was recommended.  PROCEDURE IN DETAIL:  The patient was brought to the main cardiac catheterization laboratory after signing informed consent for the procedure.  An Freida Busman test was performed on the right wrist and was positive.  The right wrist was prepped and draped in sterile fashion. Lidocaine 1% was used for local anesthesia.  A 5-French sheath was inserted into the right radial artery without difficulty.  Verapamil 3 mg was given through the sheath after insertion; 4000 units of intravenous heparin was given after sheath insertion.  Standard diagnostic catheter was used to perform selective coronary angiography. A pigtail catheter was used to perform a left ventricular angiogram. The patient tolerated the procedure well.  There were no immediate complications.  The sheath was removed from the right radial artery and Terumo hemostasis band was applied over the arteriotomy site.  The patient was having no chest pain when leaving the cath lab.  He was taken to the recovery area in stable condition.  There were no immediate complications.  HEMODYNAMIC FINDINGS:  Central aortic pressure  110/63.  Left ventricular pressure 121/5/18.  ANGIOGRAPHIC FINDINGS: 1. The left main coronary artery had no evidence of disease. 2. The left anterior descending artery was a large-caliber vessel that     coursed to the apex.  It gave off a moderate-sized diagonal branch     that had mild plaque disease.  There is a stent present in the     proximal portion of the LAD that has no significant restenosis.     There were no flow-limiting lesions noted in the LAD.  There was     mild plaque in the mid and distal vessel. 3. The circumflex artery is a large-caliber vessel that has a patent     stent in the mid vessel.  The first obtuse marginal is a large-     caliber vessel that is jailed by the previously placed stent in     2006.  There is a 60% stenosis of the ostium of this large obtuse     marginal branch that is unchanged.  There is mild plaque disease in     the distal AV groove circumflex and the second obtuse marginal     branch. 4. The right coronary artery is a small to moderate-sized nondominant     vessel that appears to have a chronic occlusion and has now     developed bridging collaterals that  fill the distal vessel. 5. Left ventricular angiogram was performed in the RAO projection that     shows normal left ventricular systolic function with ejection     fraction of 60%.  IMPRESSION: 1. Triple-vessel coronary artery disease with patent stents in the mid     circumflex and the proximal left anterior descending artery.  Total     occlusion in the right coronary artery with filling of the distal     vessel via bridging collaterals. 2. Questionable etiology of non-ST elevation myocardial infarction.     It is possible this patient had a thrombus present that resolved     overnight with heparin therapy.  RECOMMENDATIONS:  At this point I would recommend continued medical management with aspirin, Plavix, beta-blocker and statin.  He will be watched closely on telemetry for  the next 12-18 hours and then discharge home if he is stable.     Verne Carrow, MD     CM/MEDQ  D:  03/05/2011  T:  03/06/2011  Job:  [id number]  Electronically Signed by Verne Carrow MD on 03/06/2011 04:12:15 PM

## 2011-03-14 ENCOUNTER — Emergency Department (HOSPITAL_COMMUNITY)
Admission: EM | Admit: 2011-03-14 | Discharge: 2011-03-14 | Disposition: A | Discharge status: Home / Self Care | Payer: No Typology Code available for payment source | Attending: Emergency Medicine | Admitting: Emergency Medicine

## 2011-03-14 ENCOUNTER — Emergency Department (HOSPITAL_COMMUNITY): Payer: No Typology Code available for payment source

## 2011-03-14 DIAGNOSIS — Z7982 Long term (current) use of aspirin: Secondary | ICD-10-CM | POA: Insufficient documentation

## 2011-03-14 DIAGNOSIS — S139XXA Sprain of joints and ligaments of unspecified parts of neck, initial encounter: Secondary | ICD-10-CM | POA: Insufficient documentation

## 2011-03-14 DIAGNOSIS — R51 Headache: Secondary | ICD-10-CM | POA: Insufficient documentation

## 2011-03-14 DIAGNOSIS — I10 Essential (primary) hypertension: Secondary | ICD-10-CM | POA: Insufficient documentation

## 2011-03-14 DIAGNOSIS — Z79899 Other long term (current) drug therapy: Secondary | ICD-10-CM | POA: Insufficient documentation

## 2011-03-14 DIAGNOSIS — Z8673 Personal history of transient ischemic attack (TIA), and cerebral infarction without residual deficits: Secondary | ICD-10-CM | POA: Insufficient documentation

## 2011-03-14 DIAGNOSIS — E785 Hyperlipidemia, unspecified: Secondary | ICD-10-CM | POA: Insufficient documentation

## 2011-03-14 DIAGNOSIS — M542 Cervicalgia: Secondary | ICD-10-CM | POA: Insufficient documentation

## 2011-03-14 DIAGNOSIS — I252 Old myocardial infarction: Secondary | ICD-10-CM | POA: Insufficient documentation

## 2011-03-15 NOTE — Discharge Summary (Signed)
Ricardo, [name] NO.:  [id number]  MEDICAL RECORD NO.:  [id number]  LOCATION:  6531                         FACILITY:  [facility]  PHYSICIAN:  [name], MDDATE OF BIRTH:  Dec 27, 1945  DATE OF ADMISSION:  03/04/2011 DATE OF DISCHARGE:  03/06/2011                              DISCHARGE SUMMARY   PRIMARY CARDIOLOGIST:  Ricardo Carrow, MD.  PRIMARY CARE PROVIDER:  [name], MD.  DISCHARGE DIAGNOSIS:  Non-ST-segment elevation myocardial infarction.  SECONDARY DIAGNOSES: 1. Coronary artery disease status post prior inferolateral MI with     circumflex and LAD stenting in 2006 and subsequent PTCA of the     circumflex in 2011. 2. Hypertension. 3. Hyperlipidemia. 4. Ongoing tobacco abuse. 5. History of cerebrovascular accident x2. 6. Depression. 7. Chronic obstructive pulmonary disease.  ALLERGIES:  CODEINE, ACETAMINOPHEN, TETRACYCLINE, and VICODIN.  PROCEDURES:  Left heart cardiac catheterization, performed March 05, 2011, revealing patent stent in the proximal LAD without restenosis. There is a patent stent in the mid left circumflex without restenosis. The first obtuse marginal was jailed by the circumflex stent with 6% ostial stenosis and this was an unchanged area.  The RCA was nondominant and occluded with bridging collaterals.  EF was 60%.  Medical therapy recommended.  HISTORY OF PRESENT ILLNESS:  A 65 year old male with prior history of CAD status post prior drug-eluting stent placement to the left circumflex and LAD in 2006 with subsequent in-stent restenosis in the circumflex and inferolateral MI, August 2011, requiring PTCA. Unfortunately, the patient has not been taking medications, but has continued to smoke.  He was in his usual state of health until a few days prior to admission when he began to experience progressive fatigue, and on the morning of admission, noted increasing dyspnea with left- sided chest pressure  associated with diaphoresis and nausea.  The patient presented to the [facility] ED on June 11.  Because of that, he was out of nitroglycerin, and in the ED, symptoms resolved after three sublingual nitroglycerin tablets.  His ECG showed no acute changes, but point-of-care markers were elevated.  The patient admitted for further evaluation.  HOSPITAL COURSE:  The patient eventually peaked his CK at 290, MB at 20.7, and troponin I at 5.96, effectively ruling in for non-ST-elevation MI.  Decision was made to pursue cardiac catheterization which took place, June 12, revealing patent LAD and circumflex stents with a nondominant right coronary artery which appears to have been occluded with bridging collaterals.  EF was 60%.  There are no targets for intervention and exact etiology for non-STEMI was not clear.  Continue medical therapy was recommended and the patient was initiated on Plavix therapy as well as beta-blocker, statin, and aspirin.  Unfortunately with beta-blocker, the patient was noted to be bradycardic overnight with rates in the 40s and therefore beta-blocker has been discontinued. We have counseled him on the importance of smoking cessation.  He has also been seen by Cardiac Rehab with outpatient referral made.  He will be discharged home today in good condition.  DISCHARGE LABS:  Hemoglobin 13.8, hematocrit 40.6, WBC 5.8, platelets 175.  Sodium 141, potassium 4.1, chloride 108, CO2 27, BUN 14, creatinine 0.90, glucose  102, CK 290, MB 28.7, troponin-I 5.96.  DISPOSITION:  The patient will be discharged today in good condition.  FOLLOWUP PLANS AND APPOINTMENTS:  The patient will follow up with Ricardo Newcomer, PA at [facility] in July 2 at 11 a.m.  Follow up with Dr. Drue [name] as previously scheduled.  DISCHARGE MEDICATIONS: 1. Plavix 75 mg daily. 2. Lisinopril 5 mg daily. 3. Nicotine patch 21 mg x6 weeks and 14 mg x2 weeks, then 7 mg x2     weeks. 4. Pravachol 80 mg at  bedtime. 5. Aspirin 81 mg daily. 6. Nitroglycerin 0.4 mg p.r.n. chest pain. 7. Multivitamin 1 tablet daily. 8. Sertraline 50 mg daily. 9. Symbicort 160/4.5 mcg 2 puffs b.i.d. 10.Trazodone 150 mg 2 tablets at bedtime.  OUTSTANDING LAB STUDIES:  The patient will require follow-up lipids and LFTs in approximately 6-8 weeks given initiation of statin.  DURATION OF DISCHARGE ENCOUNTER:  35 minutes including physician time.     [name], ANP   ______________________________ Ricardo Carrow, MD    CB/MEDQ  D:  03/06/2011  T:  03/06/2011  Job:  [id number]  cc:   Ricardo Ora, MD  Electronically Signed by [name] ANP on 03/15/2011 02:29:43 PM Electronically Signed by Ricardo Carrow MD on 03/15/2011 05:33:57 PM

## 2011-03-25 ENCOUNTER — Encounter: Payer: Medicare Other | Admitting: Physician Assistant

## 2011-03-26 ENCOUNTER — Other Ambulatory Visit: Payer: Self-pay | Admitting: Internal Medicine

## 2011-03-28 NOTE — H&P (Signed)
NAMESIR, MALLIS NO.:  [id number]  MEDICAL RECORD NO.:  [id number]  LOCATION:  2038                         FACILITY:  [facility]  PHYSICIAN:  Luis Abed, MD, FACCDATE OF BIRTH:  [birth year]  DATE OF ADMISSION:  03/04/2011 DATE OF DISCHARGE:                             HISTORY & PHYSICAL   PRIMARY CARDIOLOGIST:  Verne Carrow, MD  PRIMARY CARE PROVIDER:  [name], MD  CHIEF COMPLAINT:  Chest pain in a patient with known coronary artery disease and noncompliance with medications.  PAST MEDICAL HISTORY: 1. Coronary artery disease status post acute inferior lateral ST-     elevation myocardial infarction in August 2011 secondary to severe     in-stent restenosis in the drug-eluting stent of the proximal to     mid circumflex artery status post percutaneous transluminal     coronary angioplasty with balloon only.     a.     Status post percutaneous coronary intervention with drug-      eluting stent to the left anterior descending and circumflex in      2006. 2. Continued tobacco abuse. 3. Hypertension. 4. Hyperlipidemia. 5. Chronic obstructive pulmonary disease. 6. 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 7. Depression. 8. Medical noncompliance secondary to financial issues. 9. Status post appendectomy. 10.Status post tonsillectomy. 11.Status post back surgery x5. 12.Status post nasal surgery x2. 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 14.Status post inguinal hernia repair.  HISTORY OF PRESENT ILLNESS:  This is a 65 year old Caucasian gentleman with known coronary artery disease who states he has been unable to afford his medications and has currently been off cardiac meds besides aspirin for the past 1-1/2 to 2 months.  Over the past several days, the patient states he has been fatigued, but it was until day of admission that the patient had  increased shortness of breath.  He rested for a little while and felt somewhat better and was able to continue moving but then had acute onset of left-sided chest pain.  The patient describes the chest pain as pressure-like, almost like someone is standing on his chest.  It is associated with shortness of breath, diaphoresis, and nausea.  The patient states he has misplaced his nitroglycerin and therefore came to the emergency department in order to get nitroglycerin.  Upon arrival the patient's EKG was without acute changes.  He was given three sublingual nitroglycerin with resolution of his symptoms.  At this time, the patient feels much better, all his symptoms have resolved.  Of note, the patient continues to smoke approximately one pack a day.  SOCIAL HISTORY:  The patient lives at home with his girlfriend.  He is retired Company secretary.  He continues to smoke approximately one pack a day, although the patient states it is less than this.  He drinks alcohol socially.  He denies any routine exercise or diet.  FAMILY HISTORY:  Pertinent for premature coronary artery disease in his mother, she passed away from myocardial infarction.  Father passed away of lung cancer.  ALLERGIES:  CODEINE, ACETAMINOPHEN, TETRACYCLINE, VICODIN.  HOME MEDICATIONS:  1. Aspirin 325 mg daily. 2. Zoloft 50 mg daily.  The patient has discontinued Plavix, Crestor, Lopressor, Neurontin, lisinopril, and Zetia.  REVIEW OF SYSTEMS:  All pertinent positives and negatives as stated in HPI.  All other systems have been reviewed and are negative.  PHYSICAL EXAMINATION:  VITAL SIGNS:  Temperature 100, pulse 68, respirations 16, blood pressure 197-126 over 62-83, O2 saturation 99% on 3 L. GENERAL:  This is a well-developed, well-nourished middle-aged gentleman.  He is in no acute distress. HEENT:  Normal. NECK:  Supple without bruit or JVD. HEART:  Regular rate and rhythm with S1 and S2.  No murmur, rub,  or gallop noted. LUNGS:  Clear to auscultation bilaterally without wheezes, rales, or rhonchi. ABDOMEN:  Soft, nontender, positive bowel sounds x4. EXTREMITIES:  No clubbing, cyanosis, or edema. MUSCULOSKELETAL:  No joint deformities or effusions. NEURO:  Alert and oriented x3.  Cranial nerves II through XII grossly intact.  Chest x-ray showing COPD with scarring in the left lung base.  No acute cardiopulmonary process noted.  EKG showing normal sinus rhythm at a rate of 77 beats per minute.  There are inferior Q-waves.  There are no acute ST-T-wave changes.  LABORATORY DATA:  WBC 10.5, hemoglobin 15.5, hematocrit 44.3, platelets 223.  Sodium 141, potassium 3.8, chloride 104, bicarb 25, BUN 13, creatinine 1.13.  Cardiac enzymes negative x1.  ASSESSMENT/PLAN:  This is a 65 year old gentleman with history of coronary artery disease status post acute inferolateral myocardial infarction in August 2011 who presents with chest pain concerning for unstable angina.  Chest pain was responsive to sublingual nitroglycerin. At this time, we plan to admit the patient to the telemetry unit as he is chest pain free.  He will be placed on IV heparin.  We will reload Plavix.  We will restart aspirin, beta-blocker, statin, and ACE inhibitor.  The patient will have cardiac markers cycled tonight.  If the patient's troponin are positive then he will be taken for cardiac catheterization in the morning.  Although if the patient's troponins are negative at this time, we also think he should be cathed if his symptoms are concerning.  We will have Dr. Clifton James reevaluate the patient in the morning.  We will also have case manager assist with medication assistance.  Further treatment will be dependent upon the above results.     Leonette Monarch, PA-C   ______________________________ Luis Abed, MD, [facility]    NB/MEDQ  D:  03/04/2011  T:  03/05/2011  Job:  [id number]  cc:   Verne Carrow,  MD [name], MD  Electronically Signed by Alen Blew P.A. on 03/17/2011 04:58:45 PM Electronically Signed by Willa Rough MD FACC on 03/28/2011 11:58:15 AM

## 2011-07-04 ENCOUNTER — Encounter: Payer: Self-pay | Admitting: Internal Medicine

## 2011-07-04 ENCOUNTER — Ambulatory Visit: Payer: Medicare Other | Admitting: Internal Medicine

## 2011-07-04 DIAGNOSIS — Z0289 Encounter for other administrative examinations: Secondary | ICD-10-CM

## 2011-08-29 ENCOUNTER — Ambulatory Visit (INDEPENDENT_AMBULATORY_CARE_PROVIDER_SITE_OTHER): Payer: Medicare Other

## 2011-08-29 DIAGNOSIS — Z23 Encounter for immunization: Secondary | ICD-10-CM

## 2011-09-28 ENCOUNTER — Other Ambulatory Visit: Payer: Self-pay | Admitting: Internal Medicine

## 2011-11-24 ENCOUNTER — Other Ambulatory Visit: Payer: Self-pay | Admitting: Internal Medicine

## 2011-11-24 ENCOUNTER — Other Ambulatory Visit: Payer: Self-pay | Admitting: Nurse Practitioner

## 2011-11-25 NOTE — Telephone Encounter (Signed)
Prescription sent to pharmacy.  Patient is due for appointment 12/2011

## 2012-01-06 ENCOUNTER — Ambulatory Visit (INDEPENDENT_AMBULATORY_CARE_PROVIDER_SITE_OTHER): Payer: Medicare Other | Admitting: Internal Medicine

## 2012-01-06 VITALS — BP 118/80 | HR 101 | Temp 98.1°F | Wt 177.0 lb

## 2012-01-06 DIAGNOSIS — F329 Major depressive disorder, single episode, unspecified: Secondary | ICD-10-CM

## 2012-01-06 DIAGNOSIS — Z125 Encounter for screening for malignant neoplasm of prostate: Secondary | ICD-10-CM

## 2012-01-06 DIAGNOSIS — I2510 Atherosclerotic heart disease of native coronary artery without angina pectoris: Secondary | ICD-10-CM

## 2012-01-06 DIAGNOSIS — F3289 Other specified depressive episodes: Secondary | ICD-10-CM

## 2012-01-06 DIAGNOSIS — F17200 Nicotine dependence, unspecified, uncomplicated: Secondary | ICD-10-CM

## 2012-01-06 DIAGNOSIS — R634 Abnormal weight loss: Secondary | ICD-10-CM

## 2012-01-06 DIAGNOSIS — J449 Chronic obstructive pulmonary disease, unspecified: Secondary | ICD-10-CM

## 2012-01-06 DIAGNOSIS — R739 Hyperglycemia, unspecified: Secondary | ICD-10-CM

## 2012-01-06 DIAGNOSIS — E785 Hyperlipidemia, unspecified: Secondary | ICD-10-CM

## 2012-01-06 DIAGNOSIS — R7309 Other abnormal glucose: Secondary | ICD-10-CM

## 2012-01-06 DIAGNOSIS — Z0000 Encounter for general adult medical examination without abnormal findings: Secondary | ICD-10-CM

## 2012-01-06 DIAGNOSIS — J4489 Other specified chronic obstructive pulmonary disease: Secondary | ICD-10-CM

## 2012-01-06 DIAGNOSIS — R7303 Prediabetes: Secondary | ICD-10-CM | POA: Insufficient documentation

## 2012-01-06 DIAGNOSIS — I739 Peripheral vascular disease, unspecified: Secondary | ICD-10-CM | POA: Insufficient documentation

## 2012-01-06 LAB — CBC WITH DIFFERENTIAL/PLATELET
Basophils Absolute: 0 10*3/uL (ref 0.0–0.1)
HCT: 46.5 % (ref 39.0–52.0)
Hemoglobin: 15.6 g/dL (ref 13.0–17.0)
Lymphs Abs: 1.3 10*3/uL (ref 0.7–4.0)
MCV: 95.1 fl (ref 78.0–100.0)
Monocytes Absolute: 0.4 10*3/uL (ref 0.1–1.0)
Neutro Abs: 5.4 10*3/uL (ref 1.4–7.7)
Platelets: 239 10*3/uL (ref 150.0–400.0)
RDW: 14.1 % (ref 11.5–14.6)

## 2012-01-06 LAB — BASIC METABOLIC PANEL
BUN: 16 mg/dL (ref 6–23)
CO2: 27 mEq/L (ref 19–32)
Chloride: 104 mEq/L (ref 96–112)
Glucose, Bld: 98 mg/dL (ref 70–99)
Potassium: 4.2 mEq/L (ref 3.5–5.1)
Sodium: 140 mEq/L (ref 135–145)

## 2012-01-06 LAB — HEMOGLOBIN A1C: Hgb A1c MFr Bld: 5.8 % (ref 4.6–6.5)

## 2012-01-06 MED ORDER — EZETIMIBE 10 MG PO TABS: 10.0000 mg | ORAL_TABLET | Freq: Every day | ORAL | Status: DC

## 2012-01-06 MED ORDER — ZOSTER VACCINE LIVE 19400 UNT/0.65ML ~~LOC~~ SOLR: 0.6500 mL | Freq: Once | SUBCUTANEOUS | Status: AC

## 2012-01-06 MED ORDER — ROSUVASTATIN CALCIUM 40 MG PO TABS: 40.0000 mg | ORAL_TABLET | Freq: Every day | ORAL | Status: DC

## 2012-01-06 MED ORDER — TRAZODONE HCL 150 MG PO TABS: 300.0000 mg | ORAL_TABLET | Freq: Every day | ORAL | Status: DC

## 2012-01-06 NOTE — Assessment & Plan Note (Signed)
For some reason, he stopped taking Crestor and zetia, discussed with the patient importance of taking his medications. Plan: Refill, reassess in 3 months.

## 2012-01-06 NOTE — Assessment & Plan Note (Signed)
Asymptomatic, on aspirin, Plavix, ACE inhibitors. Restart Crestor and zetia.

## 2012-01-06 NOTE — Assessment & Plan Note (Signed)
Doing better, smoking occasional cigarette, not every day. Emphasized the need to quit altogether.

## 2012-01-06 NOTE — Assessment & Plan Note (Signed)
Td 2008 Shingles shot-- Rx provided   pneumonia shot 12-2009  cscope 11-09, hyperplastic polyps  Dr Randa Evens, next Cscope per GI DRE enlarged, non-nodular prostate, check a PSA   recommend to continue being active and eat healthy

## 2012-01-06 NOTE — Assessment & Plan Note (Signed)
Has lost 23 pounds in the last year, most of that weight loss was several months ago. Plan: TSH, chest x-ray, he is a  smoker.

## 2012-01-06 NOTE — Assessment & Plan Note (Addendum)
Left  leg pain with ambulation. Vascular exam is normal. Will check ABIs to confirm normal circulation. If  ABIs negative, pain may be from DJD versus neurogenic claudication. He has chronic back pain.

## 2012-01-06 NOTE — Assessment & Plan Note (Signed)
Symptoms well controlled 

## 2012-01-06 NOTE — Progress Notes (Signed)
Subjective:    Patient ID: [name], male    DOB: [birth year], 66 y.o.   MRN: [id number]  HPI Here for Medicare AWV: 1. Risk factors based on Past M, S, F history: reviewed 2. Physical Activities:  Not able to exercise much, "legs gave out" x 6 months---> see ROS 3. Depression/mood:  No depression or anxiety 4. Hearing: admits to diff hearing, no problems noted during normal conversation, offered referral (declined),no tinnitus 5. ADL's:  Totally indepenedent  6. Fall Risk: low risk 7. home Safety: does feel safe at home  8. Height, weight, &visual acuity: see VS, uses glasses, due for a check up, encouraged to get an appointment  9. Counseling: provided 10. Labs ordered based on risk factors: if needed  11. Referral Coordination: if needed 12.  Care Plan, see assessment and plan  13.   Cognitive Assessment: motor and cognitive skills wnl  In addition, today we discussed the following: Hyperlipidemia, he was supposed to be taking Crestor and Zetia, he has not take such medicines in a while. He has lost 22 pounds since the last visit a year ago, most of the weight loss was last year, lately it has stabilized at around 177 pounds. He is diet and exercise has not changed. He is not depressed. Heart disease--  not needing nitroglycerin in years. COPD, good medication compliance, hardly ever uses albuterol He also complains of pain and a L hip that goes down to the whole L leg, symptoms are triggered by walking, symptoms go away approximately 15 minutes after he stops walking. Denies knee pain per se.  Past Medical History: Depression COPD, mild per PFTs 12/2008 Hyperlipidemia Back pain, used to be on neurontin CV: --Cerebrovascular accident, hx of (x2)  one in 2000 and one in 2001.The latter stroke was reportedly due to emboli from a mitral valve vegetation. --CAD stent  placement in the LAD in 1998. February 28, 2005 : acute non ST segment elevation myocardial infarction. He  subsequently underwent thrombectomy and stenting of the circumflex. He was discharged on June 13, 200  March 21 2005- readmited, had a  PCI/drug-eluting stent implantation mid-LAD. AMI 8-11, stent with in stent restenosis Circumflex, sp PTCA balloon angioplasty  Past Surgical History: Appendectomy Tonsillectomy Lumbar fusion (back surgery x 5)---Dr Hersch colon resection on rt (due to gangrene of the colon) nasal surgery x 2 (fx nose) Inguinal herniorrhaphy (x3)  Social History: Single  (his girlfriend  is Mrs.  [name], one of my patients) Occupation: retired  Cytogeneticist from [employer] tobacco-- still smokes , not every day ETOH--rarely  diet-- trying to eat healthy  exercise -- walks w/ the dog daily   Review of Systems No nausea, vomiting, diarrhea. No dysuria or gross hematuria No cough or hemoptysis No abdominal pain or bloating her stools.     Objective:   Physical Exam General -- alert, well-developed, and well-nourished.   Neck --no thyromegaly , normal carotid pulse, no LADs Lungs -- normal respiratory effort, no intercostal retractions, no accessory muscle use, and normal breath sounds.   Heart-- normal rate, regular rhythm, no murmur, and no gallop.   Abdomen--soft, non-tender, no distention, no masses, no HSM, no guarding, and no rigidity.  No bruit  Extremities-- no pretibial edema bilaterally; normal femoral and pedal pulses  Rectal-- No external abnormalities noted. Normal sphincter tone. No rectal masses or tenderness. Brown stool, Hemoccult negative Prostate:  Prostate gland firm and smooth, no enlargement, nodularity, tenderness, mass, asymmetry or induration. Neurologic--  alert & oriented X3 and strength normal in all extremities. Psych-- Cognition and judgment appear intact. Alert and cooperative with normal attention span and concentration.  not anxious appearing and not depressed appearing.       Assessment & Plan:

## 2012-01-07 ENCOUNTER — Encounter: Payer: Self-pay | Admitting: Internal Medicine

## 2012-01-08 ENCOUNTER — Telehealth: Payer: Self-pay | Admitting: *Deleted

## 2012-01-08 DIAGNOSIS — R634 Abnormal weight loss: Secondary | ICD-10-CM

## 2012-01-09 ENCOUNTER — Encounter: Payer: Self-pay | Admitting: *Deleted

## 2012-01-09 ENCOUNTER — Other Ambulatory Visit: Payer: Self-pay | Admitting: *Deleted

## 2012-01-14 NOTE — Telephone Encounter (Signed)
Done

## 2012-01-22 ENCOUNTER — Other Ambulatory Visit: Payer: Self-pay | Admitting: Internal Medicine

## 2012-01-22 ENCOUNTER — Ambulatory Visit (INDEPENDENT_AMBULATORY_CARE_PROVIDER_SITE_OTHER)
Admission: RE | Admit: 2012-01-22 | Discharge: 2012-01-22 | Disposition: A | Discharge status: Home / Self Care | Payer: Medicare Other | Source: Ambulatory Visit | Attending: Internal Medicine | Admitting: Internal Medicine

## 2012-01-22 DIAGNOSIS — R634 Abnormal weight loss: Secondary | ICD-10-CM

## 2012-01-22 DIAGNOSIS — I739 Peripheral vascular disease, unspecified: Secondary | ICD-10-CM

## 2012-01-28 ENCOUNTER — Telehealth: Payer: Self-pay | Admitting: Internal Medicine

## 2012-01-28 NOTE — Telephone Encounter (Signed)
Message copied by Duaine Dredge on Tue Jan 28, 2012 12:06 PM ------      Message from: [name] E      Created: Wed Jan 22, 2012  4:49 PM      Regarding: RE: ABI       noted      ----- Message -----         From: Duaine Dredge         Sent: 01/22/2012   1:06 PM           To: Wanda Plump, MD      Subject: RE: ABI                                                  Dr. Hoover Brunette had already changed this order, the patient was scheduled & made aware of appt on 01-10-12 & according to epic he noshowed for the appt.  I will call him & have it rescheduled.            ----- Message -----         From: Wanda Plump, MD         Sent: 01/22/2012   1:02 PM           To: Duaine Dredge, [place], [place]      Subject: RE: ABI                                                  Ok I enter the order than looks like what you said; please follow up            ----- Message -----         From: Duaine Dredge         Sent: 01/08/2012   9:46 AM           To: Wanda Plump, MD, [place], [place]      Subject: Annell Greening: ABI                                                  Per staff message from [facility], the order for "ANKLE BRACHIAL INDEX" must be deleted.  The correct order to enter is as per below, "LOWER ARTERIAL WITH ABIs".              ----- Message -----         From: Gerome Sam, NT         Sent: 01/08/2012   9:41 AM           To: Duaine Dredge      Subject: ABI                                                      01/08/12 Hey  Renee,  This order has to be change to Lower Art with ABI's.  Call me if you have any question [phone].            Thanks

## 2012-01-29 ENCOUNTER — Encounter: Payer: Self-pay | Admitting: Internal Medicine

## 2012-02-27 NOTE — Telephone Encounter (Signed)
Noted, that is all we can do

## 2012-02-27 NOTE — Telephone Encounter (Signed)
Dr. Drue Novel, as of today, patient has not responded to my multiple attempts to reach him by phone & letter to reschedule his ABIs.

## 2012-03-23 ENCOUNTER — Encounter (HOSPITAL_COMMUNITY): Payer: Self-pay | Admitting: Emergency Medicine

## 2012-03-23 ENCOUNTER — Emergency Department (HOSPITAL_COMMUNITY): Payer: Medicare Other

## 2012-03-23 ENCOUNTER — Other Ambulatory Visit: Payer: Self-pay

## 2012-03-23 ENCOUNTER — Observation Stay (HOSPITAL_COMMUNITY)
Admission: EM | Admit: 2012-03-23 | Discharge: 2012-03-24 | Disposition: A | Discharge status: Home / Self Care | Payer: Medicare Other | Attending: Cardiovascular Disease | Admitting: Cardiovascular Disease

## 2012-03-23 DIAGNOSIS — R634 Abnormal weight loss: Secondary | ICD-10-CM

## 2012-03-23 DIAGNOSIS — Z8673 Personal history of transient ischemic attack (TIA), and cerebral infarction without residual deficits: Secondary | ICD-10-CM | POA: Insufficient documentation

## 2012-03-23 DIAGNOSIS — F329 Major depressive disorder, single episode, unspecified: Secondary | ICD-10-CM | POA: Insufficient documentation

## 2012-03-23 DIAGNOSIS — Z9119 Patient's noncompliance with other medical treatment and regimen: Secondary | ICD-10-CM | POA: Insufficient documentation

## 2012-03-23 DIAGNOSIS — Z91199 Patient's noncompliance with other medical treatment and regimen due to unspecified reason: Secondary | ICD-10-CM | POA: Insufficient documentation

## 2012-03-23 DIAGNOSIS — I2510 Atherosclerotic heart disease of native coronary artery without angina pectoris: Secondary | ICD-10-CM | POA: Diagnosis present

## 2012-03-23 DIAGNOSIS — J4489 Other specified chronic obstructive pulmonary disease: Secondary | ICD-10-CM | POA: Insufficient documentation

## 2012-03-23 DIAGNOSIS — F3289 Other specified depressive episodes: Secondary | ICD-10-CM | POA: Insufficient documentation

## 2012-03-23 DIAGNOSIS — I252 Old myocardial infarction: Secondary | ICD-10-CM | POA: Insufficient documentation

## 2012-03-23 DIAGNOSIS — J449 Chronic obstructive pulmonary disease, unspecified: Secondary | ICD-10-CM | POA: Diagnosis present

## 2012-03-23 DIAGNOSIS — F17200 Nicotine dependence, unspecified, uncomplicated: Secondary | ICD-10-CM | POA: Diagnosis present

## 2012-03-23 DIAGNOSIS — Z9114 Patient's other noncompliance with medication regimen: Secondary | ICD-10-CM

## 2012-03-23 DIAGNOSIS — R079 Chest pain, unspecified: Secondary | ICD-10-CM

## 2012-03-23 DIAGNOSIS — Z91148 Patient's other noncompliance with medication regimen for other reason: Secondary | ICD-10-CM

## 2012-03-23 DIAGNOSIS — E785 Hyperlipidemia, unspecified: Secondary | ICD-10-CM | POA: Diagnosis present

## 2012-03-23 HISTORY — DX: Atherosclerotic heart disease of native coronary artery without angina pectoris: I25.10

## 2012-03-23 HISTORY — DX: Non-ST elevation (NSTEMI) myocardial infarction: I21.4

## 2012-03-23 LAB — CBC WITH DIFFERENTIAL/PLATELET
Basophils Absolute: 0 10*3/uL (ref 0.0–0.1)
Basophils Relative: 1 % (ref 0–1)
Eosinophils Absolute: 0.1 10*3/uL (ref 0.0–0.7)
MCH: 32.2 pg (ref 26.0–34.0)
MCHC: 34.7 g/dL (ref 30.0–36.0)
Monocytes Absolute: 0.4 10*3/uL (ref 0.1–1.0)
Neutro Abs: 4 10*3/uL (ref 1.7–7.7)
Neutrophils Relative %: 66 % (ref 43–77)
RDW: 13.9 % (ref 11.5–15.5)

## 2012-03-23 LAB — CBC
HCT: 41.3 % (ref 39.0–52.0)
Hemoglobin: 14.1 g/dL (ref 13.0–17.0)
MCV: 94.3 fL (ref 78.0–100.0)
RBC: 4.38 MIL/uL (ref 4.22–5.81)
WBC: 6.7 10*3/uL (ref 4.0–10.5)

## 2012-03-23 LAB — POCT I-STAT, CHEM 8
Calcium, Ion: 1.25 mmol/L (ref 1.12–1.32)
Creatinine, Ser: 0.8 mg/dL (ref 0.50–1.35)
Hemoglobin: 15 g/dL (ref 13.0–17.0)
Sodium: 141 mEq/L (ref 135–145)
TCO2: 22 mmol/L (ref 0–100)

## 2012-03-23 LAB — CREATININE, SERUM: GFR calc Af Amer: >90 mL/min (ref >90)

## 2012-03-23 LAB — CARDIAC PANEL(CRET KIN+CKTOT+MB+TROPI): CK, MB: 2.8 ng/mL (ref 0.3–4.0)

## 2012-03-23 LAB — D-DIMER, QUANTITATIVE: D-Dimer, Quant: 1.33 ug/mL-FEU — ABNORMAL HIGH (ref 0.00–0.48)

## 2012-03-23 MED ORDER — ALPRAZOLAM 0.25 MG PO TABS: 0.2500 mg | ORAL_TABLET | Freq: Two times a day (BID) | ORAL | Status: DC | PRN

## 2012-03-23 MED ORDER — ASPIRIN EC 81 MG PO TBEC
81.0000 mg | DELAYED_RELEASE_TABLET | Freq: Every day | ORAL | Status: DC
  Administered 2012-03-24: 81 mg via ORAL
  Filled 2012-03-23: qty 1

## 2012-03-23 MED ORDER — EZETIMIBE 10 MG PO TABS
10.0000 mg | ORAL_TABLET | Freq: Every day | ORAL | Status: DC
  Administered 2012-03-23 – 2012-03-24 (×2): 10 mg via ORAL
  Filled 2012-03-23 (×2): qty 1

## 2012-03-23 MED ORDER — SODIUM CHLORIDE 0.9 % IV SOLN
INTRAVENOUS | Status: DC
  Administered 2012-03-23 – 2012-03-24 (×3): via INTRAVENOUS

## 2012-03-23 MED ORDER — ATORVASTATIN CALCIUM 80 MG PO TABS
80.0000 mg | ORAL_TABLET | Freq: Every day | ORAL | Status: DC
  Administered 2012-03-23: 80 mg via ORAL
  Filled 2012-03-23 (×2): qty 1

## 2012-03-23 MED ORDER — CLOPIDOGREL BISULFATE 75 MG PO TABS
75.0000 mg | ORAL_TABLET | Freq: Every day | ORAL | Status: DC
  Administered 2012-03-23 – 2012-03-24 (×2): 75 mg via ORAL
  Filled 2012-03-23 (×2): qty 1

## 2012-03-23 MED ORDER — ENOXAPARIN SODIUM 40 MG/0.4ML ~~LOC~~ SOLN
40.0000 mg | SUBCUTANEOUS | Status: DC
  Administered 2012-03-23: 40 mg via SUBCUTANEOUS
  Filled 2012-03-23 (×2): qty 0.4

## 2012-03-23 MED ORDER — IOHEXOL 350 MG/ML SOLN
100.0000 mL | Freq: Once | INTRAVENOUS | Status: AC | PRN
  Administered 2012-03-23: 100 mL via INTRAVENOUS

## 2012-03-23 MED ORDER — TRAZODONE HCL 50 MG PO TABS
50.0000 mg | ORAL_TABLET | Freq: Every day | ORAL | Status: DC
  Administered 2012-03-23: 50 mg via ORAL
  Filled 2012-03-23 (×2): qty 1

## 2012-03-23 MED ORDER — ACETAMINOPHEN 325 MG PO TABS: 650.0000 mg | ORAL_TABLET | ORAL | Status: DC | PRN

## 2012-03-23 MED ORDER — SERTRALINE HCL 50 MG PO TABS
50.0000 mg | ORAL_TABLET | Freq: Every day | ORAL | Status: DC
  Administered 2012-03-23 – 2012-03-24 (×2): 50 mg via ORAL
  Filled 2012-03-23 (×2): qty 1

## 2012-03-23 MED ORDER — SODIUM CHLORIDE 0.9 % IJ SOLN
3.0000 mL | Freq: Two times a day (BID) | INTRAMUSCULAR | Status: DC
  Administered 2012-03-23: 3 mL via INTRAVENOUS

## 2012-03-23 MED ORDER — ONDANSETRON HCL 4 MG/2ML IJ SOLN: 4.0000 mg | Freq: Four times a day (QID) | INTRAMUSCULAR | Status: DC | PRN

## 2012-03-23 MED ORDER — SODIUM CHLORIDE 0.9 % IJ SOLN: 3.0000 mL | INTRAMUSCULAR | Status: DC | PRN

## 2012-03-23 MED ORDER — NITROGLYCERIN 0.4 MG SL SUBL: 0.4000 mg | SUBLINGUAL_TABLET | SUBLINGUAL | Status: DC | PRN

## 2012-03-23 MED ORDER — MORPHINE SULFATE 4 MG/ML IJ SOLN
4.0000 mg | Freq: Once | INTRAMUSCULAR | Status: AC
  Administered 2012-03-23: 4 mg via INTRAVENOUS
  Filled 2012-03-23: qty 1

## 2012-03-23 MED ORDER — SODIUM CHLORIDE 0.9 % IV SOLN: 250.0000 mL | INTRAVENOUS | Status: DC | PRN

## 2012-03-23 NOTE — ED Notes (Signed)
Patient transported to CT 

## 2012-03-23 NOTE — H&P (Signed)
History and Physical   Patient ID: GEREMIAH FUSSELL MRN: [id number], DOB/AGE: 03/15/1946   Admit date: 03/23/2012 Date of Consult: 03/23/2012   Primary Physician: [name], MD Primary Cardiologist: Earney Hamburg, MD  Pt. Profile: Mr. 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  HPI: Record review finds no followup with Dr. Clifton James since his cardiac evaluation last year. He reports that he has been in his usual state of health until earlier today when he began experiencing chest pain reminiscent of his presentation last year. He was driving back from [employer] at the time, took nitroglycerin with some improvement although not complete resolution. He has been observed in the CDU, now pain-free. At baseline he reports walking his dog regularly without angina. He does admit to taking no medications over the last 2 months. He cites financial constraints.  Under observation his initial cardiac markers were normal. CT of the chest was ordered by ER staff with no finding of acute pulmonary embolus. Atherosclerotic disease iwas noted within the thoracic aorta as well as coronary arteries, as would be expected based on his history. He was also incidentally noted to have a pulmonary nodule in the right upper lobe, new and measuring 4.8 mm.  Problem List: Past Medical History  Diagnosis Date  . Depression   . 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 Depression   . Coronary atherosclerosis of native coronary artery    Multivessel, stent LAD 1998, thrombectomy 2006, PTCA 2011, NSTEMI 6/12 managed medically  . COPD, mild   . Hyperlipidemia   . Colon polyp     three 2-58mm polyps in descending colon, medium sized lipoma in sigmoid colon,  multiple in recto-sigmoid colon  . NSTEMI (non-ST elevated myocardial infarction)     Past Surgical History  Procedure Date  . Appendectomy   . Tonsillectomy   . Lumbar fusion     x5 Dr. Samara Deist  . 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 Coronary stent placement 1998    placed in the LAD  . Thrombectomy 02/2005    acute non ST segment elevation myocardial infarction  . Coronary stent placement 02/2005    PCI/drug-eluting stent implantation mid-LAD  . Balloon angioplasty, artery 04/2010    sp PTCA  . Nasal fracture surgery     x2  . Inguinal hernia repair     x3     Allergies:  Allergies  Allergen Reactions  . Acetaminophen     REACTION: tongue swells  . Codeine     REACTION: Makes "him mean"  . Hydrocodone-Acetaminophen     REACTION: nausea, vomiting "makes me deathly sick"  . Tetracycline     REACTION: loss of balance    Home Medications: Prior to Admission medications   Medication Sig Start Date End Date Taking? Authorizing Provider  clopidogrel (PLAVIX) 75 MG tablet Take 75 mg by mouth daily.   Yes Historical Provider, MD  lisinopril (PRINIVIL,ZESTRIL) 5 MG tablet Take 5 mg by mouth daily.  Yes Historical Provider, MD  nitroGLYCERIN (NITROSTAT) 0.4 MG SL tablet Place 0.4 mg under the tongue as directed. 1 tablet under tongue at onset of chest pain; you may repeat every  Minutes for up to 3 doses    Yes Historical Provider, MD  sertraline (ZOLOFT) 50 MG tablet Take 50 mg by mouth daily.   Yes Historical Provider, MD  aspirin 325 MG tablet Take 81 mg by mouth daily.     Historical Provider, MD  ezetimibe (ZETIA) 10 MG tablet Take 10 mg by mouth daily. 01/06/12 01/05/13  Wanda Plump, MD  metoprolol (LOPRESSOR) 25 MG tablet Take 25 mg by mouth  2 (two) times daily. 0.5 tabs po bid     Historical Provider, MD  rosuvastatin (CRESTOR) 40 MG tablet Take 40 mg by mouth daily. 01/06/12   Wanda Plump, MD  traZODone (DESYREL) 150 MG tablet Take 300 mg by mouth at bedtime. 2 by mouth at bedtime 01/06/12   Wanda Plump, MD    Inpatient Medications:     .  morphine injection  4 mg Intravenous Once    (Not in a hospital admission)  Family History  Problem Relation Age of Onset  . Heart attack Mother   . Heart disease Mother     MI  . Lung cancer Father     +smoker  . Cancer Father     LUNG...SMOKER  . Diabetes      GM, aunt, other memebers  . Prostate cancer Neg Hx        . Colon cancer Neg Hx      History   Social History  . Marital Status: Widowed    Spouse Name: N/A    Number of Children: N/A  . Years of Education: N/A   Occupational History  . Not on file.   Social History Main Topics  . Smoking status: Current Everyday Smoker -- 0.5 packs/day    Types: Cigarettes    Last Attempt to Quit: 09/23/2010  . Smokeless tobacco: Not on file  . Alcohol Use: Yes     Rarely   . Drug Use: No     Previously smoke 1/3 ppd  . Sexually Active: Not on file   Other Topics Concern  . Not on file   Social History Narrative   Has a girl friendWALKS REGULARLYTRIES TO EAT HEALTHY     Review of Systems: No reported bleeding problems. No recent exertional chest pain, stable dyspnea on exertion. No palpitations. Last saw his primary care physician Dr. Drue Novel back in April. He cited a 22 pound weight loss at that time. Can also left leg/hip pain while ambulating. No regular use of inhalers. Otherwise negative except as outlined.  Physical Exam: Blood pressure 116/76, pulse 47, temperature 97.8 F (36.6 C), temperature source Oral, resp. rate 10, height 5\' 11"  (1.803 m), weight 177 lb (80.287 kg), SpO2 99.00%.    Thin male in no acute distress. HEENT: Conjunctiva and lids normal, oropharynx. Neck: Supple, no elevated JVP, left  carotid bruit, no thyromegaly. Lungs: Decreased but clear to auscultation, no wheezing, nonlabored breathing at rest. Cardiac: Regular rate and rhythm, no S3, soft systolic murmur, no pericardial rub. Abdomen: Soft, nontender, bowel sounds present, no guarding or rebound. Extremities: No pitting edema, distal pulses 1-2+. Skin: Warm and dry. Musculoskeletal: No kyphosis. Neuropsychiatric: Alert and oriented x3, affect grossly appropriate.   Labs: Recent Labs  Basename 03/23/12 1606 03/23/12 1604   WBC -- 6.0   HGB  15.0 14.6   HCT 44.0 42.1   MCV -- 92.9   PLT -- 205   Recent Labs  Basename 03/23/12 1604   DDIMER 1.33*    Lab 03/23/12 1606  NA 141  K 4.5  CL 107  CO2 --  BUN 14  CREATININE 0.80  CALCIUM --  PROT --  BILITOT --  ALKPHOS --  ALT --  AST --  AMYLASE --  LIPASE --  GLUCOSE 83   Recent Labs  Basename 03/23/12 1603   CKTOTAL --   CKMB --   CKMBINDEX --   TROPONINI <0.30   Radiology/Studies: Dg Chest 2 View  03/23/2012  *RADIOLOGY REPORT*  Clinical Data: Left chest pain today, smoking history  CHEST - 2 VIEW  Comparison: Chest x-ray of 01/22/2012  Findings: The lungs are clear but hyperaerated consistent with COPD.  Mediastinal contours are stable.  There is some peribronchial thickening which may indicate bronchitis.  The heart is within normal limits in size.  No bony abnormality is seen.  IMPRESSION: No active lung disease.  Probable COPD and chronic bronchitis.  Original Report Authenticated By: Juline Patch, M.D.   Ct Angio Chest W/cm &/or Wo Cm  03/23/2012  *RADIOLOGY REPORT*  Clinical Data: Chest pain.  Positive D-dimer.  CT ANGIOGRAPHY CHEST  Technique:  Multidetector CT imaging of the chest using the standard protocol during bolus administration of intravenous contrast. Multiplanar reconstructed images including MIPs were obtained and reviewed to evaluate the vascular anatomy.  Contrast: OMNIPAQUE IOHEXOL 350 MG/ML SOLN  Comparison:  03/23/2012  Findings: No axillary or supraclavicular lymph nodes identified. There is no mediastinal or hilar adenopathy identified.  There is no pericardial or pleural effusions identified. Calcified atherosclerotic disease affects the LAD, left circumflex and RCA coronary arteries.  The pulmonary arteries appear patent.  There is no evidence for an acute pulmonary embolus.  Moderate changes of emphysema identified.  There is a pulmonary nodule within the right upper lobe that measures 4.8 mm, image 57.  Review of the visualized osseous structures is negative.  Imaging through the upper abdomen shows a low density structure within the dome of the liver.  Too small to characterize but likely small cyst.  IMPRESSION:  1.  No evidence for acute pulmonary embolus or other acute cardiopulmonary abnormalities. 2.  There is atherosclerosis of the thoracic aorta, the great vessels of the mediastinum and the coronary arteries, including calcified atherosclerotic plaque in the RCA, LAD and left circumflex coronary arteries.  Atherosclerosis, including three-vessel coronary artery disease. Please note that although the presence of coronary artery calcium documents the presence of coronary artery disease, the severity of this disease and any potential stenosis cannot be assessed on this non-gated CT examination.  Assessment for potential risk factor modification, dietary therapy or pharmacologic therapy may be warranted, if clinically indicated. 3.  Pulmonary nodule in the right upper lobe is new from previous exam measuring 4.8 mm. If the patient is at high risk for bronchogenic carcinoma, follow-up chest CT at 6-12 months is recommended.  If the patient is at low risk for bronchogenic carcinoma, follow-up chest CT at 12 months is recommended.  This recommendation follows the consensus statement: Guidelines for Management of Small Pulmonary Nodules Detected on CT Scans: A Statement from the Fleischner Society as published in  Radiology 2005; 237:395-400.  Original Report Authenticated By: Rosealee Albee, M.D.    EKG: Sinus rhythm with prolonged PR interval, no acute ST segment changes, leftward axis.  Impression  1. Chest pain reminiscent of previous angina, last cardiac evaluation in June of 2012 in the setting of NSTEMI, managed medically following cardiac catheterization demonstrating patent stent sites and occluded RCA with collaterals. It was felt that that was related to transient thrombus. He reports noncompliance with any medical therapy over the last 2 months. His initial cardiac markers are normal, ECG is nonspecific. Elevated d-dimer noted with no pulmonary embolus by CT angiogram.  2. Multivessel CAD as outlined above. He has not had regular cardiac followup over the last year based on record review.  3. COPD with active tobacco use.  4. Pulmonary nodule by recent chest CT, new from prior examination. Concerning in light of his active tobacco use, also reported weight loss over the last year.  5. History of hyperlipidemia, not on statin therapy.  Plan  Admitted for further observation on telemetry, cycle full set of cardiac markers and followup ECG in the morning. He will be placed on aspirin, Plavix, Crestor as before. Full dose Lovenox for now. No beta blocker given heart rate in the 40s to 50s at baseline. Hold off on resuming ACE inhibitor for now as well with systolics in the 90s to 110 range. He will need further evaluation of the pulmonary nodule, perhaps coordinated by his primary care physician and Pulmonary medicine. Dr. Clifton James will followup on him tomorrow to determine the next step in his cardiac evaluation, at a minimum I suspect he will need a followup Myoview if he remains clinically stable without recurrent symptoms.  Jonelle Sidle, M.D., F.A.C.C.

## 2012-03-23 NOTE — ED Provider Notes (Signed)
Assumed care of patient in the CDU from Dr. Rubin Payor.  Patient comes in to the Emergency Department today with a chief complaint of chest pain and SOB.  Patient has a cardiac history significant for MI and has had previous stents and PCI.  Patient also had a Cardiac Cath done in June 2012, which showed an occluded RCA.  Patient did not have any acute changes on EKG.  Initial troponin negative.  D-dimer found to be elevated.  Patient is to have a CTA and if the CTA is negative for PE, [facility] will be consulted.  Reassessed patient.  Patient denies any Chest Pain or SOB at this time.  Alert and orientated x 3, Heart RRR, Lungs CTAB, no LE edema, DP pulse 2+ bilaterally.  7:10 PM Discussed with [facility].  They report that they will come see patient in the ED.   Pascal Lux [place], PA-C 03/23/12 2106

## 2012-03-23 NOTE — ED Notes (Signed)
Patient driving and called EMS for left sided chest pain and left upper extremity numbness with shortness of breath. Pain 10/10 took own nitro 3 SL and EMS started IV left hand 22g. Gave 1 nitro and 324mg  aspirin.  EMS reported pain decreased to 8/10 achy pressure.  History of MI and CVA.  EMS reported initial blood pressure 150/100 and after one nitro 96/60.  Oxygen level 92% room air placed on NRB  Mask by EMS. Increased to oxygen. 96-97%

## 2012-03-23 NOTE — ED Provider Notes (Signed)
History     CSN: [id number]  Arrival date & time 03/23/12  1523   First MD Initiated Contact with Patient 03/23/12 1538      Chief Complaint  Patient presents with  . Chest Pain    (Consider location/radiation/quality/duration/timing/severity/associated sxs/prior treatment) Patient is a 66 y.o. male presenting with chest pain. The history is provided by the patient.  Chest Pain Pertinent negatives for primary symptoms include no shortness of breath, no abdominal pain, no nausea and no vomiting.  Pertinent negatives for associated symptoms include no numbness and no weakness.    patient's had sharp left-sided chest pain for the last 3 hours. No relief with his nitroglycerin. States his pain has gone down to a 5/10 from 10 out of 10. He states it feels somewhat like his previous MI. It is sharp. States feels as if he has been stabbed. He's had a previous MI. No cough. He's had some shortness of breath. No nausea vomiting diarrhea. He states he's had some numbness in his left arm.  Past Medical History  Diagnosis Date  . Depression   . CVA (cerebral vascular accident) 2000,2001    The latter stoke was reportedly due to emboli from mitral valve vegetation  . Depression   . CAD (coronary artery disease) 1998  . COPD, mild 12/2008    dx per PFTs   . Hyperlipidemia   . Colon polyp     three 2-7mm polyps in descending colon, medium sized lipoma in sigmoid colon,  multiple in recto-sigmoid colon  . Myocardial infarction     Past Surgical History  Procedure Date  . Appendectomy   . Tonsillectomy   . Lumbar fusion     x5 Dr. Samara Deist  . 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 Coronary stent placement 1998    placed in the LAD  . Thrombectomy 02/2005    acute non ST segment elevation myocardial infarction  . Coronary stent placement 02/2005    PCI/drug-eluting stent implantation mid-LAD  . Balloon angioplasty, artery 04/2010    sp PTCA  . Nasal fracture surgery     x2    . Inguinal hernia repair     x3    Family History  Problem Relation Age of Onset  . Heart attack Mother   . Heart disease Mother     MI  . Lung cancer Father     +smoker  . Cancer Father     LUNG...SMOKER  . Diabetes      GM, aunt, other memebers  . Prostate cancer Neg Hx        . Colon cancer Neg Hx     History  Substance Use Topics  . Smoking status: Current Everyday Smoker -- 0.5 packs/day    Types: Cigarettes    Last Attempt to Quit: 09/23/2010  . Smokeless tobacco: Not on file  . Alcohol Use: Yes     rarelly       Review of Systems  Constitutional: Negative for activity change and appetite change.  HENT: Negative for neck stiffness.   Eyes: Negative for pain.  Respiratory: Negative for chest tightness and shortness of breath.   Cardiovascular: Positive for chest pain. Negative for leg swelling.  Gastrointestinal: Negative for nausea, vomiting, abdominal pain and diarrhea.  Genitourinary: Negative for flank pain.  Musculoskeletal: Negative for back pain.  Skin: Negative for rash.  Neurological: Negative for weakness, numbness and headaches.  Psychiatric/Behavioral: Negative for behavioral problems.  Allergies  Acetaminophen; Codeine; Hydrocodone-acetaminophen; and Tetracycline  Home Medications   Current Outpatient Rx  Name Route Sig Dispense Refill  . CLOPIDOGREL BISULFATE 75 MG PO TABS Oral Take 75 mg by mouth daily.    Marland Kitchen LISINOPRIL 5 MG PO TABS Oral Take 5 mg by mouth daily.    Marland Kitchen NITROGLYCERIN 0.4 MG SL SUBL Sublingual Place 0.4 mg under the tongue as directed. 1 tablet under tongue at onset of chest pain; you may repeat every  Minutes for up to 3 doses     . SERTRALINE HCL 50 MG PO TABS Oral Take 50 mg by mouth daily.    . ASPIRIN 325 MG PO TABS Oral Take 81 mg by mouth daily.     Marland Kitchen EZETIMIBE 10 MG PO TABS Oral Take 10 mg by mouth daily.    Marland Kitchen METOPROLOL TARTRATE 25 MG PO TABS Oral Take 25 mg by mouth 2 (two) times daily. 0.5 tabs po bid     .  ROSUVASTATIN CALCIUM 40 MG PO TABS Oral Take 40 mg by mouth daily.    . TRAZODONE HCL 150 MG PO TABS Oral Take 300 mg by mouth at bedtime. 2 by mouth at bedtime      BP 97/56  Pulse 49  Temp 98.3 F (36.8 C) (Oral)  Resp 18  Ht 5\' 11"  (1.803 m)  Wt 177 lb (80.287 kg)  BMI 24.69 kg/m2  SpO2 98%  Physical Exam  Nursing note and vitals reviewed. Constitutional: He is oriented to person, place, and time. He appears well-developed and well-nourished.  HENT:  Head: Normocephalic and atraumatic.  Eyes: EOM are normal. Pupils are equal, round, and reactive to light.  Neck: Normal range of motion. Neck supple.  Cardiovascular: Normal rate, regular rhythm and normal heart sounds.   No murmur heard. Pulmonary/Chest: Effort normal and breath sounds normal. He exhibits tenderness.       Tender to left anterior chest wall. No crepitance or deformity. There is a palpable mass on the left chest in the area of the pectoralis muscle.  Abdominal: Soft. Bowel sounds are normal. He exhibits no distension and no mass. There is no tenderness. There is no rebound and no guarding.  Musculoskeletal: Normal range of motion. He exhibits no edema.  Neurological: He is alert and oriented to person, place, and time. No cranial nerve deficit.  Skin: Skin is warm and dry.  Psychiatric: He has a normal mood and affect.    ED Course  Procedures (including critical care time)  Labs Reviewed  D-DIMER, QUANTITATIVE - Abnormal; Notable for the following:    D-Dimer, Quant 1.33 (*)     All other components within normal limits  CBC WITH DIFFERENTIAL  TROPONIN I  POCT I-STAT, CHEM 8   Dg Chest 2 View  03/23/2012  *RADIOLOGY REPORT*  Clinical Data: Left chest pain today, smoking history  CHEST - 2 VIEW  Comparison: Chest x-ray of 01/22/2012  Findings: The lungs are clear but hyperaerated consistent with COPD.  Mediastinal contours are stable.  There is some peribronchial thickening which may indicate bronchitis.   The heart is within normal limits in size.  No bony abnormality is seen.  IMPRESSION: No active lung disease.  Probable COPD and chronic bronchitis.  Original Report Authenticated By: Juline Patch, M.D.   Ct Angio Chest W/cm &/or Wo Cm  03/23/2012  *RADIOLOGY REPORT*  Clinical Data: Chest pain.  Positive D-dimer.  CT ANGIOGRAPHY CHEST  Technique:  Multidetector CT imaging of  the chest using the standard protocol during bolus administration of intravenous contrast. Multiplanar reconstructed images including MIPs were obtained and reviewed to evaluate the vascular anatomy.  Contrast: OMNIPAQUE IOHEXOL 350 MG/ML SOLN  Comparison: 03/23/2012  Findings: No axillary or supraclavicular lymph nodes identified. There is no mediastinal or hilar adenopathy identified.  There is no pericardial or pleural effusions identified. Calcified atherosclerotic disease affects the LAD, left circumflex and RCA coronary arteries.  The pulmonary arteries appear patent.  There is no evidence for an acute pulmonary embolus.  Moderate changes of emphysema identified.  There is a pulmonary nodule within the right upper lobe that measures 4.8 mm, image 57.  Review of the visualized osseous structures is negative.  Imaging through the upper abdomen shows a low density structure within the dome of the liver.  Too small to characterize but likely small cyst.  IMPRESSION:  1.  No evidence for acute pulmonary embolus or other acute cardiopulmonary abnormalities. 2.  There is atherosclerosis of the thoracic aorta, the great vessels of the mediastinum and the coronary arteries, including calcified atherosclerotic plaque in the RCA, LAD and left circumflex coronary arteries.  Atherosclerosis, including three-vessel coronary artery disease. Please note that although the presence of coronary artery calcium documents the presence of coronary artery disease, the severity of this disease and any potential stenosis cannot be assessed on this  non-gated CT examination.  Assessment for potential risk factor modification, dietary therapy or pharmacologic therapy may be warranted, if clinically indicated. 3.  Pulmonary nodule in the right upper lobe is new from previous exam measuring 4.8 mm. If the patient is at high risk for bronchogenic carcinoma, follow-up chest CT at 6-12 months is recommended.  If the patient is at low risk for bronchogenic carcinoma, follow-up chest CT at 12 months is recommended.  This recommendation follows the consensus statement: Guidelines for Management of Small Pulmonary Nodules Detected on CT Scans: A Statement from the Fleischner Society as published in Radiology 2005; 237:395-400.  Original Report Authenticated By: Rosealee Albee, M.D.     1. Chest pain      Date: 03/23/2012  Rate: 60  Rhythm: normal sinus rhythm  QRS Axis: left  Intervals: normal  ST/T Wave abnormalities: normal  Conduction Disutrbances:none  Narrative Interpretation:   Old EKG Reviewed: unchanged    MDM  Patient with left-sided chest pain. Relieved somewhat with nitroglycerin. Relieved completely with morphine. Tender left chest with pleuritic pain and positive d-dimer. CT and she was done and did not show coronary embolism. He did show a pulmonary nodule but will need to follow. EKG is unchanged. Patient has had recent cath that showed occluded RCA. Patient will be seen by [facility].        Juliet Rude. Rubin Payor, MD 03/23/12 [phone]

## 2012-03-23 NOTE — ED Notes (Signed)
Meal tray given to patient.

## 2012-03-24 ENCOUNTER — Other Ambulatory Visit: Payer: Self-pay | Admitting: Internal Medicine

## 2012-03-24 ENCOUNTER — Other Ambulatory Visit: Payer: Self-pay | Admitting: Cardiology

## 2012-03-24 LAB — CBC
Hemoglobin: 13.8 g/dL (ref 13.0–17.0)
Platelets: 194 10*3/uL (ref 150–400)
RBC: 4.34 MIL/uL (ref 4.22–5.81)
WBC: 5.5 10*3/uL (ref 4.0–10.5)

## 2012-03-24 LAB — BASIC METABOLIC PANEL
CO2: 28 mEq/L (ref 19–32)
Chloride: 108 mEq/L (ref 96–112)
Sodium: 143 mEq/L (ref 135–145)

## 2012-03-24 LAB — CARDIAC PANEL(CRET KIN+CKTOT+MB+TROPI)
CK, MB: 2.8 ng/mL (ref 0.3–4.0)
Total CK: 86 U/L (ref 7–232)
Total CK: 83 U/L (ref 7–232)
Troponin I: <0.3 ng/mL (ref <0.30)

## 2012-03-24 LAB — TSH: TSH: 0.83 u[IU]/mL (ref 0.350–4.500)

## 2012-03-24 LAB — HEMOGLOBIN A1C: Hgb A1c MFr Bld: 5.5 % (ref <5.7)

## 2012-03-24 MED ORDER — CLOPIDOGREL BISULFATE 75 MG PO TABS: 75.0000 mg | ORAL_TABLET | Freq: Every day | ORAL | Status: DC

## 2012-03-24 MED ORDER — NITROGLYCERIN 0.4 MG SL SUBL: 0.4000 mg | SUBLINGUAL_TABLET | SUBLINGUAL | Status: DC

## 2012-03-24 MED ORDER — ASPIRIN 81 MG PO TBEC: 81.0000 mg | DELAYED_RELEASE_TABLET | Freq: Every day | ORAL | Status: DC

## 2012-03-24 NOTE — Progress Notes (Signed)
    SUBJECTIVE: No recurrence of chest pain or SOB overnight.   BP 106/71  Pulse 56  Temp 97.4 F (36.3 C) (Oral)  Resp 20  Ht 5\' 11"  (1.803 m)  Wt 177 lb (80.287 kg)  BMI 24.69 kg/m2  SpO2 96%  Intake/Output Summary (Last 24 hours) at 03/24/12 0745 Last data filed at 03/24/12 0600  Gross per 24 hour  Intake   1235 ml  Output    600 ml  Net    635 ml    PHYSICAL EXAM General: Well developed, well nourished, in no acute distress. Alert and oriented x 3.  Psych:  Good affect, responds appropriately Neck: No JVD. No masses noted.  Lungs: Clear bilaterally with no wheezes or rhonci noted.  Heart: RRR with no murmurs noted. Abdomen: Bowel sounds are present. Soft, non-tender.  Extremities: No lower extremity edema.   LABS: Basic Metabolic Panel:  Basename 03/24/12 0625 03/23/12 2208 03/23/12 1606  NA 143 -- 141  K 4.5 -- 4.5  CL 108 -- 107  CO2 28 -- --  GLUCOSE 92 -- 83  BUN 11 -- 14  CREATININE 0.83 0.85 --  CALCIUM 8.7 -- --  MG -- -- --  PHOS -- -- --   CBC:  Basename 03/24/12 0625 03/23/12 2208 03/23/12 1604  WBC 5.5 6.7 --  NEUTROABS -- -- 4.0  HGB 13.8 14.1 --  HCT 41.1 41.3 --  MCV 94.7 94.3 --  PLT 194 199 --   Cardiac Enzymes:  Basename 03/24/12 0319 03/23/12 2208 03/23/12 2018  CKTOTAL 86 93 --  CKMB 2.8 2.8 --  CKMBINDEX -- -- --  TROPONINI <0.30 <0.30 <0.30     Current Meds:    . aspirin EC  81 mg Oral Daily  . atorvastatin  80 mg Oral q1800  . clopidogrel  75 mg Oral Daily  . enoxaparin  40 mg Subcutaneous Q24H  . ezetimibe  10 mg Oral Daily  .  morphine injection  4 mg Intravenous Once  . sertraline  50 mg Oral Daily  . sodium chloride  3 mL Intravenous Q12H  . traZODone  50 mg Oral QHS     ASSESSMENT AND PLAN:  1. CAD: Admitted with chest pain. He has ruled out for an MI. He had one episode of chest pain yesterday. Has had no exertional angina at home. He has been off of all meds. Will get him back on his home meds  including ASA, Plavix, statin. Will not resume beta blocker with bradycardia. Will not resume Ace-inh with relative hypotension. Ambulate this am and discharge later in am if no recurrence of chest pain.   2. Dispo: D/C later in am. Follow up with me or Tereso Newcomer  in 3-4 weeks.     Nizar Cutler  7/2/20137:45 AM

## 2012-03-24 NOTE — Discharge Summary (Signed)
See full note this am. cdm 

## 2012-03-24 NOTE — Discharge Instructions (Signed)
PLEASE REMEMBER TO BRING ALL OF YOUR MEDICATIONS TO EACH OF YOUR FOLLOW-UP OFFICE VISITS.  PLEASE TAKE ALL MEDICATIONS AS PRESCRIBED.   LISINOPRIL AND METOPROLOL TARTRATE HAVE BEEN STOPPED.  ASPIRIN IS OVER-THE-COUNTER. PLAVIX AND NITROGLYCERIN ARE PART OF THE [facility] $4.00 LIST.   PLEASE CALL THE OFFICE IF YOU HAVE TROUBLE AFFORDING YOUR MEDICATIONS.   Angina  Angina is discomfort caused by inadequate oxygen delivery to the heart muscle. Angina is a response to blockage or narrowing of the coronary arteries. It alerts you about a blood flow problem to your heart. New, more frequent, or severe angina is a warning signal for you to seek medical care right away.  CAUSES  The coronary arteries supply blood and oxygen to the heart muscle. The heart muscle needs a constant supply of oxygen in order to pump blood to the body. These arteries often become blocked by hardened deposits of blood products (plaque) including fat and cholesterol. The following contribute to the formation of plaque:  High cholesterol.  High blood pressure.  Smoking.  Obesity.  Diabetes.  SYMPTOMS  The most common problem is a deep discomfort in the center of your chest. The discomfort may also be felt in, or move to your:  Arms (especially the left one).  Throat.  Jaw.  Back.  Upper stomach.  Angina may be brought on by exercise, emotional upset, heavy meals, or extremes of heat or cold.  It may resolve within 5 to 10 minutes after a period of rest.  Angina symptoms vary from person to person.  If you have angina and it is treated, it may resolve. If you feel it again, the symptoms may not be the same in both type and location.  DIAGNOSIS  Emergency room evaluation or hospital admission may be needed to determine if there are any blockages of your coronary arteries.  Blood tests, EKGs, and chest X-rays may be done.  Further testing may include a stress test or an angiogram.  A heart specialist (cardiologist) may  be asked to assist with your evaluation.  Other conditions that may feel like angina, but are not a problem with the heart include:  Muscle strain in the chest wall.  Blood clots in the lung.  Anxiety.  Acid reflux from the stomach.  RISKS AND COMPLICATIONS  Angina that is not treated or evaluated can lead to a decline in oxygen delivery to the heart muscle, a heart attack, or even death.  TREATMENT  Depending on severity of the blockages and on other factors, angina may be treated with:  Lifestyle changes such as weight loss, stopping smoking, appropriate exercise, or low cholesterol and low salt diet.  Medications to control or to treat the risk factors for angina.  Procedures such as angioplasty or stent placement may allow the blockages to be opened without surgery.  Open heart surgery may be needed to bypass blocked arteries that cannot be treated by other methods.  HOME CARE INSTRUCTIONS  If your caregiver prescribed medication to control your angina, take them as directed. Report side effects. Do not stop medications or adjust the dosages on your own.  Regular exercise is good for you as long as it does not cause discomfort. Avoid activities that trigger attacks of angina. Walking is the best exercise. Do not begin any new type of exercise until you check with your caregiver.  You may still have a sexual relationship if it does not cause angina. Tell your caregiver if it does.  Stop smoking.  Do not use nicotine patches or gum until you check with your caregiver.  If you are overweight, you should lose weight. Eat a heart healthy diet that is low in fat and salt.  If your caregiver has given you a follow-up appointment, it is very important to keep that appointment. Not keeping the appointment could result in a heart attack, permanent heart damage, and disability. If there is any problem keeping the appointment, you must call back to this facility for assistance.  SEEK MEDICAL CARE IF:    Your angina seems to be occurring more frequently or seems to be lasting longer.  You are having problems that you think may be side effects of the medicine you are taking.  SEEK IMMEDIATE MEDICAL CARE IF:  You have severe chest discomfort, especially if the pain is crushing or pressure-like and spreads to the arms, back, neck, or jaw.  You are sweating, feel sick to your stomach (nauseous), or have shortness of breath.  You have an attack of angina that does not get better after rest or taking your usual medicine.  You wake from sleep with chest pain.  You feel dizzy, faint, or experience extreme fatigue.  You have chest pain not typical of your usual angina. THIS IS AN EMERGENCY. Do not wait to see if the pain will go away. Get medical help at once. Call 911. DO NOT drive yourself to the hospital.  MAKE SURE YOU:  Understand these instructions.  Will watch your condition.  Will get help right away if you are not doing well or get worse.  Document Released: 09/09/2005 Document Revised: 08/29/2011 Document Reviewed: 04/12/2008  [facility] Patient Information 2012 [place], [place].

## 2012-03-24 NOTE — ED Provider Notes (Signed)
Medical screening examination/treatment/procedure(s) were conducted as a shared visit with non-physician practitioner(s) and myself.  I personally evaluated the patient during the encounter  [name]. Rubin Payor, MD 03/24/12 1610

## 2012-03-24 NOTE — Progress Notes (Signed)
Pt walked the entire unit with no c/o's CP, SHOB, dizziness, etc.  HR was 50's-60's.

## 2012-03-24 NOTE — Progress Notes (Signed)
Discharge complete.  Pt attempting to get in touch with son.

## 2012-03-24 NOTE — Progress Notes (Signed)
Pt's HR has been in the 40's since he got on the floor, which has been his baseline per MD's note since he came to the hospital. At one point in time pt went down to 39, non-sustained, pt was asleep and asymptomatic, Dr Genevieve Norlander of [facility] was notified, no orders received. Will continue to monitor pt.---Ezmeralda Stefanick, Rayaan Lorah, rn

## 2012-03-24 NOTE — Discharge Summary (Signed)
Discharge Summary   Patient ID: [name],  MRN: [id number], DOB/AGE: 1945-10-23 66 y.o.  Admit date: 17-Apr-2012 Discharge date: 03/24/2012  Discharge Diagnoses Principal Problem:  *Chest pain Active Problems:  HYPERLIPIDEMIA  TOBACCO ABUSE  CORONARY ARTERY DISEASE  COPD  Noncompliance with medication regimen   Allergies Allergies  Allergen Reactions  . Acetaminophen     REACTION: tongue swells  . Codeine     REACTION: Makes "him mean"  . Hydrocodone-Acetaminophen     REACTION: nausea, vomiting "makes me deathly sick"  . Tetracycline     REACTION: loss of balance    Diagnostic Studies/Procedures  CHEST X-RAY- 04/17/12  Comparison: Chest x-ray of 01/22/2012  Findings: The lungs are clear but hyperaerated consistent with  COPD. Mediastinal contours are stable. There is some  peribronchial thickening which may indicate bronchitis. The heart  is within normal limits in size. No bony abnormality is seen.  IMPRESSION:  No active lung disease. Probable COPD and chronic bronchitis.  CT ANGIOGRAPHY CHEST- Apr 17, 2012  Technique: Multidetector CT imaging of the chest using the  standard protocol during bolus administration of intravenous  contrast. Multiplanar reconstructed images including MIPs were  obtained and reviewed to evaluate the vascular anatomy.  Contrast: OMNIPAQUE IOHEXOL 350 MG/ML SOLN  Comparison: 2012-04-17  Findings: No axillary or supraclavicular lymph nodes identified.  There is no mediastinal or hilar adenopathy identified. There is  no pericardial or pleural effusions identified. Calcified  atherosclerotic disease affects the LAD, left circumflex and RCA  coronary arteries.  The pulmonary arteries appear patent. There is no evidence for an  acute pulmonary embolus. Moderate changes of emphysema identified.  There is a pulmonary nodule within the right upper lobe that  measures 4.8 mm, image 57. Review of the visualized osseous  structures  is negative.  Imaging through the upper abdomen shows a low density structure  within the dome of the liver. Too small to characterize but likely  small cyst.  IMPRESSION:  1. No evidence for acute pulmonary embolus or other acute  cardiopulmonary abnormalities.  2. There is atherosclerosis of the thoracic aorta, the great  vessels of the mediastinum and the coronary arteries, including  calcified atherosclerotic plaque in the RCA, LAD and left  circumflex coronary arteries.  Atherosclerosis, including three-vessel coronary artery disease.  Please note that although the presence of coronary artery calcium  documents the presence of coronary artery disease, the severity of  this disease and any potential stenosis cannot be assessed on this  non-gated CT examination. Assessment for potential risk factor  modification, dietary therapy or pharmacologic therapy may be  warranted, if clinically indicated.  3. Pulmonary nodule in the right upper lobe is new from previous  exam measuring 4.8 mm. If the patient is at high risk for  bronchogenic carcinoma, follow-up chest CT at 6-12 months is  recommended. If the patient is at low risk for bronchogenic  carcinoma, follow-up chest CT at 12 months is recommended. This  recommendation follows the consensus statement: Guidelines for  Management of Small Pulmonary Nodules Detected on CT Scans: A  Statement from the Fleischner Society as published in Radiology  2005; 237:395-400.  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  The patient reported being in his USOH since undergoing cardiac evaluation last year with Dr. Clifton James. He walks his dogs regularly (sometimes 2 miles in one day) without incident. Of note, he had not followed up since that time and has not been taking his medications including ASA/Plavix/ACEi/BB due to financial constraints. He continues to smoke 5 cigarettes a day and has noted a modest unintentional weight loss. No hemoptysis. He was driving back from [place] and experienced sudden, SSCP with associated shortness of breath, diaphoresis and nausea. This was reminiscent of his prior MI, thus prompting his presentation to [facility] ED.   Upon ED arrival, EKG revealed sinus bradycardia, but no evidence of acute ischemia. Initial cardiac biomarkers were WNL. D-dimer was mildly elevated. CT-A as above, was negative for PE, but did note a RUL nodule measuring 4.8 mm. Diffuse atherosclerosis of the aorta and coronary arteries was noted as well. CXR as above revealed no acute cardiopulmonary abnormalities, but chronic bronchitic changes were identified. Given the patient's cardiac history, risk factors and HPI concerning for unstable angina, he was admitted for ACS rule out.   Hospital Course   ASA and Plavix were continued. BB and ACEi were held in the setting of sinus bradycardia (albeit asymptomatic) on admission and labile BPs. He was placed on DVT prophylaxis Lovenox. There were no further events overnight. He remained in sinus bradycardia off of beta-blockade, but was asymptomatic with this.   Cardiac biomarkers were cycled and found to be negative x 3. He effectively ruled out from an ACS standpoint. He will ambulate this morning, and if he does well with this, will be discharged as recommended by Dr. Clifton James who examined the patient this AM. ACEi and BB will continued to be held. ASA and Plavix will be continued. ASA is relatively  inexpensive OTC and NTG, Plavix is part of the [facility] $4 list. All other medications will be continued as below. This will be conveyed to the patient on discharge. He will follow-up in the office with Dr. Clifton James or Tereso Newcomer, PA-C in 3-4 weeks. The need for cardiac stress testing will be determined at that time.  Additionally, he will follow-up with his PCP (previously scheduled appointment on 04/06/12) to follow-up on the documented pulmonary nodule on chest CT-A this admission. This information, including supplemental angina material, has been clearly outlined in the discharge AVS.   Discharge Vitals:  Blood pressure 106/71, pulse 56, temperature 97.4 F (36.3 C), temperature source Oral, resp. rate 20, height 5\' 11"  (1.803 m), weight 80.287 kg (177 lb), SpO2 96.00%.   Labs: Recent Labs  Basename 03/24/12 0625 03/23/12 2208   WBC 5.5 6.7   HGB 13.8 14.1   HCT 41.1 41.3   MCV 94.7 94.3   PLT 194 199    Recent Labs  Basename 03/23/12 1604   DDIMER 1.33*    Lab 03/24/12 0625 03/23/12 2208 03/23/12 1606  NA 143 -- 141  K 4.5 -- 4.5  CL 108 -- 107  CO2 28 -- --  BUN 11 -- 14  CREATININE 0.83 0.85 0.80  CALCIUM 8.7 -- --  PROT -- -- --  BILITOT -- -- --  ALKPHOS -- -- --  ALT -- -- --  AST -- -- --  AMYLASE -- -- --  LIPASE -- -- --  GLUCOSE  92 -- 83   Recent Labs  Basename 03/24/12 0319 03/23/12 2208 03/23/12 2018   CKTOTAL 86 93 --   CKMB 2.8 2.8 --   CKMBINDEX -- -- --   TROPONINI <0.30 <0.30 <0.30   Disposition:  Discharge Orders    Future Appointments: Provider: Department: Dept Phone: Center:   04/06/2012 1:30 PM Wanda Plump, MD [facility] [phone] [facility]       Discharge Medications:  Medication List  As of 03/24/2012  8:10 AM   ASK your doctor about these medications         aspirin 325 MG tablet      clopidogrel 75 MG tablet   Commonly known as: PLAVIX      ezetimibe 10 MG tablet   Commonly known as: ZETIA      lisinopril 5 MG tablet    Commonly known as: PRINIVIL,ZESTRIL      metoprolol tartrate 25 MG tablet   Commonly known as: LOPRESSOR      nitroGLYCERIN 0.4 MG SL tablet   Commonly known as: NITROSTAT      rosuvastatin 40 MG tablet   Commonly known as: CRESTOR      sertraline 50 MG tablet   Commonly known as: ZOLOFT      traZODone 150 MG tablet   Commonly known as: DESYREL           Outstanding Labs/Studies: None  Duration of Discharge Encounter: Greater than 30 minutes including physician time.  Signed, R. Hurman Horn, PA-C 03/24/2012, 8:10 AM

## 2012-03-25 NOTE — Telephone Encounter (Signed)
Refill done.  

## 2012-04-03 ENCOUNTER — Encounter: Payer: Medicare Other | Admitting: Physician Assistant

## 2012-04-06 ENCOUNTER — Encounter: Payer: Self-pay | Admitting: Internal Medicine

## 2012-04-06 ENCOUNTER — Ambulatory Visit (INDEPENDENT_AMBULATORY_CARE_PROVIDER_SITE_OTHER): Payer: Medicare Other | Admitting: Internal Medicine

## 2012-04-06 VITALS — BP 90/62 | HR 72 | Temp 98.4°F | Wt 171.0 lb

## 2012-04-06 DIAGNOSIS — R634 Abnormal weight loss: Secondary | ICD-10-CM

## 2012-04-06 DIAGNOSIS — I739 Peripheral vascular disease, unspecified: Secondary | ICD-10-CM

## 2012-04-06 DIAGNOSIS — R911 Solitary pulmonary nodule: Secondary | ICD-10-CM | POA: Insufficient documentation

## 2012-04-06 DIAGNOSIS — E785 Hyperlipidemia, unspecified: Secondary | ICD-10-CM

## 2012-04-06 DIAGNOSIS — F329 Major depressive disorder, single episode, unspecified: Secondary | ICD-10-CM

## 2012-04-06 DIAGNOSIS — I2510 Atherosclerotic heart disease of native coronary artery without angina pectoris: Secondary | ICD-10-CM

## 2012-04-06 DIAGNOSIS — F17200 Nicotine dependence, unspecified, uncomplicated: Secondary | ICD-10-CM

## 2012-04-06 LAB — LIPID PANEL
Cholesterol: 97 mg/dL (ref 0–200)
HDL: 39.9 mg/dL (ref >39.00)
Triglycerides: 92 mg/dL (ref 0.0–149.0)
VLDL: 18.4 mg/dL (ref 0.0–40.0)

## 2012-04-06 NOTE — Assessment & Plan Note (Addendum)
Recently admitted with chest pain, currently  asymptomatic, will see cardiology soon. Unable to afford Plavix . Holding beta blockers and ACE inhibitors due to low BP. Low BP is causing no apparent symptoms, denies fatigue or dizziness

## 2012-04-06 NOTE — Assessment & Plan Note (Signed)
4.8 mm pulmonary nodule per CT 03-2012. Patient aware of findings, aware could be cancer. Plan: CT in 6 months.

## 2012-04-06 NOTE — Assessment & Plan Note (Signed)
Denies any problems at this time, good compliance with medication.

## 2012-04-06 NOTE — Assessment & Plan Note (Deleted)
Counseled again 

## 2012-04-06 NOTE — Assessment & Plan Note (Signed)
Reports good compliance with Zetia and Crestor, labs

## 2012-04-06 NOTE — Assessment & Plan Note (Addendum)
Has lost 6 additional pounds. Etiology unclear, denies depression, thyroid function normal, A1c normal. Apparently he is eating healthier now that he lives with his son. We'll continue watching.

## 2012-04-06 NOTE — Progress Notes (Signed)
  Subjective:    Patient ID: [name], male    DOB: [birth year], 66 y.o.   MRN: [id number]  HPI Routine office visit and hospital followup. Was admitted to the hospital 03/23/2012 for 24 hours, presented with chest pain and bradycardia. Rule out for acute MI, CT angiogram -->  no PE but he had a Pulmonary nodule in the right upper lobe,  new from previous  exam measuring 4.8 mm.   Because bradycardia was recommended to hold beta blockers and ACE inhibitors.  Weight loss, he continued to lose weight, on April 2013 he was 177 pounds, today is 171 pounds.  Past Medical History: Depression COPD, mild per PFTs 12/2008 Hyperlipidemia Back pain, used to be on neurontin CV: --Cerebrovascular accident, hx of (x2)  one in 2000 and one in 2001.The latter stroke was reportedly due to emboli from a mitral valve vegetation. 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 Denies chest pain, cough, hemoptysis. He continue to lose weight, denies depression. Denies fatigue or dizziness    Objective:   Physical Exam General -- alert, well-developed Lungs -- normal respiratory effort, no intercostal retractions, no accessory muscle use, and normal breath sounds.   Heart-- normal rate, regular rhythm, no murmur, and no gallop.   Abdomen--soft, non-tender, no distention, no masses, no HSM, no guarding, and no rigidity.   Extremities-- no pretibial edema bilaterally  Neurologic-- alert & oriented X3 and strength normal  in all extremities. Psych-- Cognition and judgment appear intact. Alert and cooperative with normal attention span and concentration.  not anxious appearing and not depressed appearing.      Assessment & Plan:

## 2012-04-06 NOTE — Assessment & Plan Note (Signed)
Continue with some pain, did not followup with ABIs. Will call when ready to proceed with further workup.

## 2012-04-07 ENCOUNTER — Encounter: Payer: Self-pay | Admitting: Physician Assistant

## 2012-04-07 ENCOUNTER — Ambulatory Visit (INDEPENDENT_AMBULATORY_CARE_PROVIDER_SITE_OTHER): Payer: Medicare Other | Admitting: Physician Assistant

## 2012-04-07 VITALS — BP 97/62 | HR 75 | Ht 71.0 in | Wt 170.0 lb

## 2012-04-07 DIAGNOSIS — R911 Solitary pulmonary nodule: Secondary | ICD-10-CM

## 2012-04-07 DIAGNOSIS — R079 Chest pain, unspecified: Secondary | ICD-10-CM

## 2012-04-07 DIAGNOSIS — I2510 Atherosclerotic heart disease of native coronary artery without angina pectoris: Secondary | ICD-10-CM

## 2012-04-07 DIAGNOSIS — E785 Hyperlipidemia, unspecified: Secondary | ICD-10-CM

## 2012-04-07 NOTE — Patient Instructions (Addendum)
Your physician recommends that you schedule a follow-up appointment in: 4 MONTHS WITH DR. Clifton James  STOP PLAVIX AND START ASPIRIN 325 MG DAILY

## 2012-04-07 NOTE — Progress Notes (Signed)
[address] [place], [place]  [address] Phone: [phone] Fax:  [phone]  Date:  04/07/2012   Name:  [name]   DOB:  August 17, 1946   MRN:  [id number]  PCP:  [name], MD  Primary Cardiologist:  [name]  Primary Electrophysiologist:  None    History of Present Illness: [name] is a 66 y.o. male who returns for post hospital follow up.  He has a history of CAD, status post prior LAD stenting 1998, thrombectomy 2006, PTCA 2011, NSTEMI 02/2011 (LHC 03/04/11: pLAD stent patent, mCFX stent patent, OM1 jailed by previously placed stent with ostial 60%-unchanged, chronically occluded RCA with bridging collaterals filling the distal vessel, EF 60%).  Culprit for his NSTEMI at that time was unknown.  It was hypothesized that there was thrombus formation that spontaneously resolved with heparin pre-catheterization.  He was treated medically.  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  Echo 04/2010: EF 50-55%, grade 1 diastolic dysfunction, mild RVE, mild RAE.  Patient lost to followup.  He was admitted 7/1-7/2 with chest pain.  D-dimer was elevated.  Chest CT 03/23/12: No pulmonary embolism, coronary atherosclerosis, right upper lobe pulmonary nodule 4.8 mm (followup CT recommended in 6-12 months).  He ruled out for myocardial infarction by enzymes.  He was taken off of his ACE inhibitor and beta blocker due to bradycardia and hypotension.  He was off of his medications.  Therefore aspirin, Plavix and statin were restarted with plans for follow up today.  He could not afford plavix.  He is only taking ASA.  Doing well.  The patient denies chest pain, shortness of breath, syncope, orthopnea, PND or significant pedal edema.  Saw his PCP who is arranging follow up CT.   Past Medical History  Diagnosis Date  . Depression   . CVA (cerebral vascular accident) 2000, 2001    The latter stoke was reportedly due to emboli  from mitral valve vegetation  . Depression   . Coronary atherosclerosis of native coronary artery     Multivessel, stent LAD 1998, thrombectomy 2006, PTCA 2011, NSTEMI 6/12 managed medically  . COPD, mild   . Hyperlipidemia   . Colon polyp     three 2-55mm polyps in descending colon, medium sized lipoma in sigmoid colon,  multiple in recto-sigmoid colon  . NSTEMI (non-ST elevated myocardial infarction)     Current Outpatient Prescriptions  Medication Sig Dispense Refill  . aspirin EC 81 MG EC tablet Take 1 tablet (81 mg total) by mouth daily.  30 tablet  3  . ezetimibe (ZETIA) 10 MG tablet Take 10 mg by mouth daily.      . nitroGLYCERIN (NITROSTAT) 0.4 MG SL tablet Place 1 tablet (0.4 mg total) under the tongue as directed. 1 tablet under tongue at onset of chest pain; you may repeat every  Minutes for up to 3 doses  25 tablet  3  . rosuvastatin (CRESTOR) 40 MG tablet Take 40 mg by mouth daily.      . sertraline (ZOLOFT) 50 MG tablet Take 50 mg by mouth daily.      . traZODone (DESYREL) 150 MG tablet Take 300 mg by mouth at bedtime. 2 by mouth at bedtime        Allergies: Allergies  Allergen Reactions  . Acetaminophen     REACTION: tongue swells  . Codeine     REACTION: Makes "him mean"  . Hydrocodone-Acetaminophen  REACTION: nausea, vomiting "makes me deathly sick"  . Tetracycline     REACTION: loss of balance  . Vicodin (Hydrocodone-Acetaminophen)     History  Substance Use Topics  . Smoking status: Current Everyday Smoker -- 0.2 packs/day    Types: Cigarettes    Last Attempt to Quit: 09/23/2010  . Smokeless tobacco: Never Used  . Alcohol Use: No     Rarely      PHYSICAL EXAM: VS:  BP 97/62  Pulse 75  Ht 5\' 11"  (1.803 m)  Wt 170 lb (77.111 kg)  BMI 23.71 kg/m2 Well nourished, well developed, in no acute distress HEENT: normal Neck: no JVD Cardiac:  normal S1, S2; RRR; no murmur Lungs:  clear to auscultation bilaterally, no wheezing, rhonchi or  rales Abd: soft, nontender, no hepatomegaly Ext: no edema Skin: warm and dry Neuro:  CNs 2-12 intact, no focal abnormalities noted  EKG:  NSR, HR 77, LAD, Non-specific ST-T changes      ASSESSMENT AND PLAN:  1.  Chest Pain No recurrence.  Continue current Rx.  2.  CAD He could not afford Plavix. Increase ASA to 325 mg QD. Continue statin. BP too soft for beta blocker or ACE. Follow up with [name] in 4 mos.  3.  Hyperlipidemia Controlled.  Continue current therapy.  4.  Pulmonary Nodule Follow up CT per Dr. Drue Novel.   Signed, Tereso Newcomer, PA-C  12:50 PM 04/07/2012

## 2012-04-09 ENCOUNTER — Encounter: Payer: Self-pay | Admitting: *Deleted

## 2012-04-16 ENCOUNTER — Telehealth: Payer: Self-pay | Admitting: Internal Medicine

## 2012-04-16 NOTE — Telephone Encounter (Signed)
Patient states he would like Dr. Drue Novel to discuss his scan results with his daughter, Misty Stanley [place]ancey Flemings) [place]. He states she does not want him to wait 6 months for another scan. She can be reached at [phone]

## 2012-04-16 NOTE — Telephone Encounter (Signed)
There is a signed DPR on file allowing his daughter, Misty Stanley, access to his medical information.

## 2012-04-21 NOTE — Telephone Encounter (Signed)
Spoke with Misty Stanley, explained her days CT findings and the guide [place] in reference to recheck in 6 months. She verbalized understanding. She is concerned about weight loss (I am too, see my last office visit note) Plan to recheck on that matter when he comes back. She also believes he is somehow depressed, he denied at the last office visit, we agreed that he should come with a family member for the next visit so we can get a clear picture of how he is feeling.

## 2012-08-07 ENCOUNTER — Ambulatory Visit (INDEPENDENT_AMBULATORY_CARE_PROVIDER_SITE_OTHER): Payer: Medicare Other | Admitting: Internal Medicine

## 2012-08-07 VITALS — BP 110/64 | HR 76 | Temp 97.7°F | Ht 71.0 in | Wt 172.0 lb

## 2012-08-07 DIAGNOSIS — E785 Hyperlipidemia, unspecified: Secondary | ICD-10-CM

## 2012-08-07 DIAGNOSIS — I739 Peripheral vascular disease, unspecified: Secondary | ICD-10-CM

## 2012-08-07 DIAGNOSIS — R634 Abnormal weight loss: Secondary | ICD-10-CM

## 2012-08-07 DIAGNOSIS — J449 Chronic obstructive pulmonary disease, unspecified: Secondary | ICD-10-CM

## 2012-08-07 DIAGNOSIS — F17200 Nicotine dependence, unspecified, uncomplicated: Secondary | ICD-10-CM

## 2012-08-07 DIAGNOSIS — F329 Major depressive disorder, single episode, unspecified: Secondary | ICD-10-CM

## 2012-08-07 DIAGNOSIS — I2510 Atherosclerotic heart disease of native coronary artery without angina pectoris: Secondary | ICD-10-CM

## 2012-08-07 DIAGNOSIS — Z23 Encounter for immunization: Secondary | ICD-10-CM

## 2012-08-07 MED ORDER — ROSUVASTATIN CALCIUM 40 MG PO TABS: 40.0000 mg | ORAL_TABLET | Freq: Every day | ORAL | Status: DC

## 2012-08-07 NOTE — Assessment & Plan Note (Signed)
Quit ~ 03-2012, praised!

## 2012-08-07 NOTE — Assessment & Plan Note (Signed)
Based on the last cholesterol he was recommend to continue with Zetia and decrease Crestor from 40 mg to 20 mg He ran out of Zetia. At this point, to keep a simple medical regimen we'll officially discontinue Zetia and increase Crestor back to 40 mg daily.

## 2012-08-07 NOTE — Assessment & Plan Note (Signed)
Since he quit tobacco, he feels irritable, anxious. He was supposed to take sertraline 50 mg daily, he is only taking 25 mg a day Plan: Take sertraline 50 mg every day.

## 2012-08-07 NOTE — Progress Notes (Signed)
  Subjective:    Patient ID: [name], male    DOB: [birth year], 66 y.o.   MRN: [id number]  HPI ROV Quit tobacco in 4 months ago, since then he feels irritable and nervous. High cholesterol, we decrease Crestor dose from 40 mg to 20 mg daily and recommend to continue with Zetia however he ran out of Zetia.  Past Medical History: Depression COPD, mild per PFTs 12/2008 Hyperlipidemia Back pain, used to be on neurontin CV: --Cerebrovascular accident, hx of (x2)  one in 2000 and one in 2001.The latter stroke was reportedly due to emboli from a mitral valve vegetation. 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    Objective:   Physical Exam General -- alert, well-developed  Lungs -- normal respiratory effort, no intercostal retractions, no accessory muscle use, and decreased  breath sounds.   Heart-- normal rate, regular rhythm, no murmur, and no gallop.   Abdomen-- no bruit Extremities-- no pretibial edema bilaterally, normal femoral and pedal pulses B Psych-- Cognition and judgment appear intact. Alert and cooperative with normal attention span and concentration.  not anxious appearing and not depressed appearing.       Assessment & Plan:

## 2012-08-07 NOTE — Assessment & Plan Note (Signed)
Asymptomatic, reports he will see cardiology next month

## 2012-08-07 NOTE — Assessment & Plan Note (Signed)
Weight stable.

## 2012-08-07 NOTE — Assessment & Plan Note (Addendum)
Essentially asymptomatic, quit tobacco! 01/03/2009: PFTs  Mild flow  limitation. Consider repeat PFTs next year however the most important treatment has been done ( quit tobacco)

## 2012-08-07 NOTE — Assessment & Plan Note (Signed)
I don't see any reports of ABIs, exam show good pedal and femoral pulses, pain is most likely not vascular. Probably related to previous back surgeries. Unable to see his neurosurgeon again. We discussed possibly refer to another group

## 2012-08-07 NOTE — Patient Instructions (Addendum)
Take Crestor 40 mg every day. Increase sertraline 50 mg daily Come back fasting in 3 months

## 2012-08-10 ENCOUNTER — Encounter: Payer: Self-pay | Admitting: Internal Medicine

## 2012-08-19 ENCOUNTER — Emergency Department (HOSPITAL_BASED_OUTPATIENT_CLINIC_OR_DEPARTMENT_OTHER)
Admission: EM | Admit: 2012-08-19 | Discharge: 2012-08-19 | Disposition: A | Discharge status: Home / Self Care | Payer: Medicare Other | Attending: Emergency Medicine | Admitting: Emergency Medicine

## 2012-08-19 ENCOUNTER — Encounter (HOSPITAL_BASED_OUTPATIENT_CLINIC_OR_DEPARTMENT_OTHER): Payer: Self-pay | Admitting: *Deleted

## 2012-08-19 ENCOUNTER — Ambulatory Visit (INDEPENDENT_AMBULATORY_CARE_PROVIDER_SITE_OTHER): Payer: Medicare Other | Admitting: Internal Medicine

## 2012-08-19 DIAGNOSIS — Z7982 Long term (current) use of aspirin: Secondary | ICD-10-CM | POA: Insufficient documentation

## 2012-08-19 DIAGNOSIS — F3289 Other specified depressive episodes: Secondary | ICD-10-CM | POA: Insufficient documentation

## 2012-08-19 DIAGNOSIS — R404 Transient alteration of awareness: Secondary | ICD-10-CM | POA: Insufficient documentation

## 2012-08-19 DIAGNOSIS — R55 Syncope and collapse: Secondary | ICD-10-CM

## 2012-08-19 DIAGNOSIS — Z860100 Personal history of colon polyps, unspecified: Secondary | ICD-10-CM | POA: Insufficient documentation

## 2012-08-19 DIAGNOSIS — E785 Hyperlipidemia, unspecified: Secondary | ICD-10-CM | POA: Insufficient documentation

## 2012-08-19 DIAGNOSIS — I214 Non-ST elevation (NSTEMI) myocardial infarction: Secondary | ICD-10-CM | POA: Insufficient documentation

## 2012-08-19 DIAGNOSIS — Z8673 Personal history of transient ischemic attack (TIA), and cerebral infarction without residual deficits: Secondary | ICD-10-CM | POA: Insufficient documentation

## 2012-08-19 DIAGNOSIS — F329 Major depressive disorder, single episode, unspecified: Secondary | ICD-10-CM | POA: Insufficient documentation

## 2012-08-19 DIAGNOSIS — I2510 Atherosclerotic heart disease of native coronary artery without angina pectoris: Secondary | ICD-10-CM | POA: Insufficient documentation

## 2012-08-19 DIAGNOSIS — F17200 Nicotine dependence, unspecified, uncomplicated: Secondary | ICD-10-CM | POA: Insufficient documentation

## 2012-08-19 DIAGNOSIS — Z79899 Other long term (current) drug therapy: Secondary | ICD-10-CM | POA: Insufficient documentation

## 2012-08-19 LAB — CBC WITH DIFFERENTIAL/PLATELET
Basophils Absolute: 0 10*3/uL (ref 0.0–0.1)
Basophils Relative: 0 % (ref 0–1)
Eosinophils Absolute: 0.1 10*3/uL (ref 0.0–0.7)
Eosinophils Relative: 2 % (ref 0–5)
HCT: 40.5 % (ref 39.0–52.0)
MCHC: 35.3 g/dL (ref 30.0–36.0)
MCV: 91 fL (ref 78.0–100.0)
Monocytes Absolute: 0.4 10*3/uL (ref 0.1–1.0)
RDW: 14.3 % (ref 11.5–15.5)

## 2012-08-19 LAB — COMPREHENSIVE METABOLIC PANEL
ALT: 8 U/L (ref 0–53)
Calcium: 9.2 mg/dL (ref 8.4–10.5)
GFR calc Af Amer: >90 mL/min (ref >90)
Glucose, Bld: 95 mg/dL (ref 70–99)
Sodium: 140 mEq/L (ref 135–145)
Total Protein: 6.6 g/dL (ref 6.0–8.3)

## 2012-08-19 LAB — TROPONIN I: Troponin I: <0.3 ng/mL (ref <0.30)

## 2012-08-19 NOTE — ED Notes (Signed)
MD at bedside. 

## 2012-08-19 NOTE — ED Notes (Signed)
Pt amb to triage using cane, gait steady. Pt reports he "passed out" yesterday while walking through his living room. Denies having any pain or feeling dizzy prior, states "I was just walking along, the next thing I know someone is picking me up off the floor..." states he called dr. Drue Novel today who told him to come to the ed for eval. Pt denies any c/o at this time.

## 2012-08-19 NOTE — ED Provider Notes (Signed)
History     CSN: [id number]  Arrival date & time 08/19/12  1524   First MD Initiated Contact with Patient 08/19/12 1538      Chief Complaint  Patient presents with  . syncope yesterday.     (Consider location/radiation/quality/duration/timing/severity/associated sxs/prior treatment) HPI Comments: Patient was walking from the living room to kitchen yesterday and the next thing he knew he was on the floor.  This was witnessed by his family who helped him up off the floor.  He was unconscious for about one minute, he believes.  He denies any aura or palpitations preceding the incident.  He denies any recent injuries.  He does have a significant cardiac history but nothing like this has ever happened before.  He feels fine.  He is only here because he called his pcp to tell him this happened and was told to come here.    Patient is a 66 y.o. male presenting with syncope. The history is provided by the patient.  Loss of Consciousness This is a new problem. The current episode started yesterday. Episode frequency: once  The problem has been resolved. Pertinent negatives include no chest pain, no headaches and no shortness of breath. Nothing aggravates the symptoms. Nothing relieves the symptoms.    Past Medical History  Diagnosis Date  . Depression   . 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 Depression   . Coronary atherosclerosis of native coronary artery     Multivessel, stent LAD 1998, thrombectomy 2006, PTCA 2011, NSTEMI 6/12 managed medically  . COPD, mild   . Hyperlipidemia   . Colon polyp     three 2-37mm polyps in descending colon, medium sized lipoma in sigmoid colon,  multiple in recto-sigmoid colon  . NSTEMI (non-ST elevated myocardial infarction)     Past Surgical History  Procedure Date  . Appendectomy   . Tonsillectomy   . Lumbar fusion     x5 Dr. Samara Deist  . 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 Coronary stent placement 1998    placed in the LAD  . Thrombectomy 02/2005    acute non ST segment elevation myocardial infarction  . Coronary stent placement 02/2005    PCI/drug-eluting stent implantation mid-LAD  . Balloon angioplasty, artery 04/2010    sp PTCA  . Nasal fracture surgery     x2  . Inguinal hernia repair     x3    Family History  Problem Relation Age of Onset  . Heart attack Mother   . Heart disease Mother     MI  . Lung cancer Father     +smoker  . Cancer Father     LUNG...SMOKER  . Diabetes      GM, aunt, other memebers  . Prostate cancer Neg Hx        . Colon cancer Neg Hx     History  Substance Use Topics  . Smoking status: Current Every Day Smoker -- 0.2 packs/day    Types: Cigarettes    Last Attempt to Quit: 09/23/2010  . Smokeless tobacco: Never Used  . Alcohol Use: No     Comment: Rarely       Review of Systems  Respiratory: Negative for shortness of breath.   Cardiovascular: Positive for syncope. Negative for chest pain.  Neurological: Negative for headaches.  All other systems reviewed and are negative.  Allergies  Acetaminophen; Codeine; Hydrocodone-acetaminophen; Tetracycline; and Vicodin  Home Medications   Current Outpatient Rx  Name  Route  Sig  Dispense  Refill  . ASPIRIN EC 325 MG PO TBEC   Oral   Take 1 tablet (325 mg total) by mouth daily.         Marland Kitchen NITROGLYCERIN 0.4 MG SL SUBL   Sublingual   Place 1 tablet (0.4 mg total) under the tongue as directed. 1 tablet under tongue at onset of chest pain; you may repeat every  Minutes for up to 3 doses   25 tablet   3   . ROSUVASTATIN CALCIUM 40 MG PO TABS   Oral   Take 1 tablet (40 mg total) by mouth daily.   30 tablet   6   . SERTRALINE HCL 50 MG PO TABS   Oral   Take 50 mg by mouth daily.         . TRAZODONE HCL 150 MG PO TABS   Oral   Take 300 mg by mouth at bedtime. 2 by mouth at bedtime           BP 134/86  Pulse 73   Temp 98.2 F (36.8 C) (Oral)  Resp 18  SpO2 100%  Physical Exam  Nursing note and vitals reviewed. Constitutional: He is oriented to person, place, and time. He appears well-developed and well-nourished. No distress.  HENT:  Head: Normocephalic and atraumatic.  Mouth/Throat: Oropharynx is clear and moist.  Eyes: EOM are normal. Pupils are equal, round, and reactive to light.  Neck: Normal range of motion. Neck supple.  Cardiovascular: Normal rate, regular rhythm and normal heart sounds.   Pulmonary/Chest: Effort normal and breath sounds normal. No respiratory distress. He has no wheezes. He has no rales.  Abdominal: Soft. Bowel sounds are normal. He exhibits no distension. There is no tenderness.  Musculoskeletal: Normal range of motion. He exhibits no edema.  Lymphadenopathy:    He has no cervical adenopathy.  Neurological: He is alert and oriented to person, place, and time. No cranial nerve deficit. He exhibits normal muscle tone. Coordination normal.  Skin: Skin is warm and dry. He is not diaphoretic.    ED Course  Procedures (including critical care time)  Labs Reviewed - No data to display No results found.   No diagnosis found.   Date: 08/19/2012  Rate: 61  Rhythm: normal sinus rhythm  QRS Axis: normal  Intervals: normal  ST/T Wave abnormalities: normal  Conduction Disutrbances:none  Narrative Interpretation:   Old EKG Reviewed: unchanged    MDM  The patient presents here after a syncopal episode yesterday.  He has been fine since and is completely symptom-free at present.  He only came in because his pcp told him to.  The labs and ekg are unremarkable and do not explain the episode.  I suspect either a loss of vagal tone or potentially an arrhythmia.  He does not want to stay in the hospital as Thanksgiving is tomorrow.  He assures me he call Dr. Weldon Picking office on Friday to arrange a follow up appointment.  To return prn if he worsens.        Geoffery Lyons, MD 08/19/12 1705

## 2012-08-20 NOTE — Progress Notes (Signed)
  Subjective:    Patient ID: [name], male    DOB: [birth year], 66 y.o.   MRN: [id number]  HPI  rec ER eval  Review of Systems     Objective:   Physical Exam        Assessment & Plan:

## 2012-10-12 ENCOUNTER — Encounter: Payer: Self-pay | Admitting: *Deleted

## 2012-10-12 ENCOUNTER — Telehealth: Payer: Self-pay | Admitting: Internal Medicine

## 2012-10-12 DIAGNOSIS — R911 Solitary pulmonary nodule: Secondary | ICD-10-CM

## 2012-10-12 NOTE — Telephone Encounter (Signed)
Called pt, "no longer a working number." mailed letter to pt.

## 2012-10-12 NOTE — Telephone Encounter (Signed)
Advise patient, 1. Due for a CT chest to followup a nodule, order entered  2. due for a routine checkup. Please arrange

## 2012-10-13 ENCOUNTER — Encounter: Payer: Self-pay | Admitting: Internal Medicine

## 2012-10-22 ENCOUNTER — Ambulatory Visit (INDEPENDENT_AMBULATORY_CARE_PROVIDER_SITE_OTHER)
Admission: RE | Admit: 2012-10-22 | Discharge: 2012-10-22 | Disposition: A | Discharge status: Home / Self Care | Payer: Medicare Other | Source: Ambulatory Visit | Attending: Internal Medicine | Admitting: Internal Medicine

## 2012-10-22 DIAGNOSIS — R911 Solitary pulmonary nodule: Secondary | ICD-10-CM

## 2012-10-23 ENCOUNTER — Telehealth: Payer: Self-pay | Admitting: *Deleted

## 2012-10-23 ENCOUNTER — Other Ambulatory Visit: Payer: Medicare Other

## 2012-10-23 NOTE — Telephone Encounter (Signed)
Pt requesting results of CT scan done on yesterday..Please advise

## 2012-10-23 NOTE — Telephone Encounter (Signed)
Advise patient, CT showed a stable nodule. Next CT in one year. Also, he is due for a routine checkup, please schedule

## 2012-10-23 NOTE — Telephone Encounter (Signed)
Left message to call office

## 2012-10-28 NOTE — Telephone Encounter (Signed)
Discuss with patient  

## 2012-11-04 ENCOUNTER — Telehealth: Payer: Self-pay | Admitting: *Deleted

## 2012-11-04 MED ORDER — SERTRALINE HCL 50 MG PO TABS: 50.0000 mg | ORAL_TABLET | Freq: Every day | ORAL | Status: DC

## 2012-11-04 NOTE — Telephone Encounter (Signed)
Refill done.  

## 2012-11-06 ENCOUNTER — Ambulatory Visit: Payer: Medicare Other | Admitting: Internal Medicine

## 2012-11-13 ENCOUNTER — Telehealth: Payer: Self-pay | Admitting: Internal Medicine

## 2012-11-13 ENCOUNTER — Encounter: Payer: Self-pay | Admitting: *Deleted

## 2012-11-13 ENCOUNTER — Ambulatory Visit: Payer: Medicare Other | Admitting: Internal Medicine

## 2012-11-13 MED ORDER — TRAZODONE HCL 150 MG PO TABS: 300.0000 mg | ORAL_TABLET | Freq: Every day | ORAL | Status: DC

## 2012-11-13 NOTE — Telephone Encounter (Signed)
Refill done.  Letter mailed to pt to notify him he is due for an OV.

## 2012-11-13 NOTE — Telephone Encounter (Signed)
Day #30 and one refill. Needs office visit

## 2012-11-13 NOTE — Telephone Encounter (Signed)
Ok to refill? Last OV 11.27.13 Last filled 4.15.13

## 2012-11-13 NOTE — Telephone Encounter (Signed)
Refill: trazadone 150 mg #180. 2qhs. Last fill 10-16-12

## 2012-12-23 ENCOUNTER — Telehealth: Payer: Self-pay | Admitting: *Deleted

## 2012-12-23 NOTE — Telephone Encounter (Signed)
Refill request for trazodone 15mg .  Last OV 11.27.13 Last filled 2.21.14

## 2012-12-23 NOTE — Telephone Encounter (Signed)
Denied, see last prescription, has enough until 01/11/2013. Also, he is overdue for visit, please schedule a visit.

## 2012-12-25 NOTE — Telephone Encounter (Signed)
Pt scheduled appt 5.7.14 .

## 2013-01-27 ENCOUNTER — Ambulatory Visit (INDEPENDENT_AMBULATORY_CARE_PROVIDER_SITE_OTHER): Payer: Medicare Other | Admitting: Internal Medicine

## 2013-01-27 ENCOUNTER — Encounter: Payer: Self-pay | Admitting: Internal Medicine

## 2013-01-27 ENCOUNTER — Other Ambulatory Visit: Payer: Self-pay | Admitting: Internal Medicine

## 2013-01-27 VITALS — BP 122/80 | HR 56 | Temp 98.4°F | Resp 16 | Ht 71.0 in | Wt 179.0 lb

## 2013-01-27 DIAGNOSIS — Z0000 Encounter for general adult medical examination without abnormal findings: Secondary | ICD-10-CM

## 2013-01-27 DIAGNOSIS — J449 Chronic obstructive pulmonary disease, unspecified: Secondary | ICD-10-CM

## 2013-01-27 DIAGNOSIS — R222 Localized swelling, mass and lump, trunk: Secondary | ICD-10-CM | POA: Insufficient documentation

## 2013-01-27 DIAGNOSIS — R7303 Prediabetes: Secondary | ICD-10-CM

## 2013-01-27 DIAGNOSIS — M549 Dorsalgia, unspecified: Secondary | ICD-10-CM

## 2013-01-27 DIAGNOSIS — R7309 Other abnormal glucose: Secondary | ICD-10-CM

## 2013-01-27 DIAGNOSIS — I2510 Atherosclerotic heart disease of native coronary artery without angina pectoris: Secondary | ICD-10-CM

## 2013-01-27 DIAGNOSIS — R911 Solitary pulmonary nodule: Secondary | ICD-10-CM

## 2013-01-27 DIAGNOSIS — F329 Major depressive disorder, single episode, unspecified: Secondary | ICD-10-CM

## 2013-01-27 NOTE — Assessment & Plan Note (Signed)
Symptoms well controlled 

## 2013-01-27 NOTE — Assessment & Plan Note (Signed)
Seems to be doing well, continue with aspirin, Crestor. Will check a cholesterol panel.

## 2013-01-27 NOTE — Assessment & Plan Note (Signed)
History of back surgeries, now has back pain with radiation to the clinic. He also has left shoulder pain: Refer to orthopedic surgery

## 2013-01-27 NOTE — Assessment & Plan Note (Addendum)
Noted a growth at the  chest wall about a year ago, some discomfort, benign features on exam, first time the issue is brought to my attention. Given size and  discomfort we'll ask surgery to evaluate.

## 2013-01-27 NOTE — Telephone Encounter (Signed)
Refill done.  

## 2013-01-27 NOTE — Progress Notes (Signed)
Subjective:    Patient ID: [name], male    DOB: [birth year], 67 y.o.   MRN: [id number]  HPI  Here for Medicare AWV:  1. Risk factors based on Past M, S, F history: reviewed  2. Physical Activities: walks the dog  3. Depression/mood: No depression or anxiety noted or reported 4. Hearing: admits to diff hearing, no problems noted during normal conversation, offered referral (declined),no tinnitus  5. ADL's: Totally independent, drives 6. Fall Risk: low risk , see instructions  7. home Safety: does feel safe at home  8. Height, weight, &visual acuity: see VS, uses glasses, states gets a check q o year 9. Counseling: provided  10. Labs ordered based on risk factors: if needed  11. Referral Coordination: if needed  12. Care Plan, see assessment and plan  13. Cognitive Assessment: motor and cognitive skills wnl   In addition, today we discussed the following: Last visit 07-2012, had a syncope, went to the ER, workup negative, no further Events. High cholesterol, good compliance with Crestor. On Zoloft and trazodone, symptoms well-controlled. Back pain: history of back surgery, he was doing very good up until approximately June 2013 when he developed a steady low back pain with on-off radiation to the left leg usually up to the knee and sometimes up the foot. Pain is at times severe (8/10), currently taking no medications.Occasionally has numbness @ the left leg as well. Also complained of a lump in the chest for about a year, left-sided, slightly tender. Also left shoulder mostly with arm elevation.  Past Medical History  Diagnosis Date  . Depression   . 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 Depression   . Coronary atherosclerosis of native coronary artery     Multivessel, stent LAD 1998, thrombectomy 2006, PTCA 2011, NSTEMI 6/12 managed medically  . COPD, mild   . Hyperlipidemia   . Colon  polyp     three 2-96mm polyps in descending colon, medium sized lipoma in sigmoid colon,  multiple in recto-sigmoid colon  . NSTEMI (non-ST elevated myocardial infarction)      Past Surgical History  Procedure Laterality Date  . Appendectomy    . Tonsillectomy    . Lumbar fusion      x5 Dr. Samara Deist  . 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 Coronary stent placement  1998    placed in the LAD  . Thrombectomy  02/2005    acute non ST segment elevation myocardial infarction  . Coronary stent placement  02/2005    PCI/drug-eluting stent implantation mid-LAD  . Balloon angioplasty, artery  04/2010    sp PTCA  . Nasal fracture surgery      x2  . 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 Heart attack Mother   . Lung cancer Father     +smoker  . Diabetes      GM, aunt, other memebers  . Prostate cancer Neg Hx        . Colon cancer Neg Hx  Review of Systems Denies chest pain or shortness or breath.  No  nausea, vomiting, diarrhea or blood in the stools. No dysuria gross nocturia. No bladder or bowel incontinence.     Objective:   Physical Exam  Musculoskeletal:       Arms:  BP 122/80  Pulse 56  Temp(Src) 98.4 F (36.9 C) (Oral)  Resp 16  Ht 5\' 11"  (1.803 m)  Wt 179 lb (81.194 kg)  BMI 24.98 kg/m2  SpO2 93%  General -- alert, well-developed, No apparent distress  Neck --no thyromegaly , normal carotid pulse   Lungs -- normal respiratory effort, no intercostal retractions, no accessory muscle use, and normal breath sounds.   Heart-- normal rate, regular rhythm, no murmur, and no gallop.   Abdomen--soft, non-tender, no distention, no masses, no HSM, no guarding, and no  rigidity. No bruit  Extremities-- no pretibial edema bilaterally; normal pedal and  femoral pulses. Shoulders symmetric, range of motion of the left shoulder decreased due to pain. Back-- Well healed surgical scars, not tender to palpation Neurologic-- alert & oriented X3 ; Lower extremity DTRs and  strength normal. Psych-- Cognition and judgment appear intact. Alert and cooperative with normal attention span and concentration.  not anxious appearing and not depressed appearing.       Assessment & Plan:

## 2013-01-27 NOTE — Assessment & Plan Note (Signed)
Due for labs

## 2013-01-27 NOTE — Assessment & Plan Note (Addendum)
Td 2008 Shingles shot-- Rx provided last year  pneumonia shot 12-2009  cscope 11-09, hyperplastic polyps  Dr Randa Evens, next Cscope per [facility]  PSA  (normal) ---> 2013, next 2015 recommend to try to be  active and eat healthy Quit tobacco! Info provided

## 2013-01-27 NOTE — Assessment & Plan Note (Signed)
Last CT chest  09-2012, next in one year

## 2013-01-27 NOTE — Patient Instructions (Signed)

## 2013-01-27 NOTE — Assessment & Plan Note (Signed)
Seems to be doing well, Unfortunately he still smokes on and off. Counseled

## 2013-01-28 ENCOUNTER — Encounter: Payer: Self-pay | Admitting: Internal Medicine

## 2013-01-28 LAB — LIPID PANEL
Cholesterol: 185 mg/dL (ref 0–200)
LDL Cholesterol: 123 mg/dL — ABNORMAL HIGH (ref 0–99)
Triglycerides: 114 mg/dL (ref 0.0–149.0)

## 2013-02-02 ENCOUNTER — Telehealth: Payer: Self-pay | Admitting: *Deleted

## 2013-02-02 MED ORDER — EZETIMIBE 10 MG PO TABS: 10.0000 mg | ORAL_TABLET | Freq: Every day | ORAL | Status: DC

## 2013-02-02 MED ORDER — TRAZODONE HCL 150 MG PO TABS: 300.0000 mg | ORAL_TABLET | Freq: Every day | ORAL | Status: DC

## 2013-02-02 NOTE — Telephone Encounter (Signed)
Discussed with pt, rx sent to pharmacy.  

## 2013-02-02 NOTE — Telephone Encounter (Signed)
Message copied by Nada Maclachlan on Tue Feb 02, 2013  4:24 PM ------      Message from: [name]      Created: Mon Feb 01, 2013  5:32 PM       Advise patient,      Blood sugar test, hemoglobin A1c, is a stable.      Cholesterol needs improvement, he is taking Crestor 40 mg daily, we need to re start Zetia 10 mg one by mouth daily call #30 and 6 refills.      Will recheck his cholesterol when he comes back ------

## 2013-02-03 ENCOUNTER — Other Ambulatory Visit: Payer: Self-pay | Admitting: Internal Medicine

## 2013-02-03 ENCOUNTER — Telehealth: Payer: Self-pay | Admitting: General Practice

## 2013-02-03 NOTE — Telephone Encounter (Signed)
Pt called upset that his Zetia was $90. Told the pt that per the pharmacist that he can pick up #30 at a time and only pay $30 a month. Pt agreed.

## 2013-02-03 NOTE — Telephone Encounter (Signed)
Spoke to pharmacy, pt still has refills left on file. Refill request sent in error.  

## 2013-02-08 ENCOUNTER — Encounter (INDEPENDENT_AMBULATORY_CARE_PROVIDER_SITE_OTHER): Payer: Self-pay | Admitting: Surgery

## 2013-02-08 ENCOUNTER — Ambulatory Visit (INDEPENDENT_AMBULATORY_CARE_PROVIDER_SITE_OTHER): Payer: Medicare Other | Admitting: Surgery

## 2013-02-08 VITALS — BP 110/72 | HR 62 | Temp 97.4°F | Resp 18 | Ht 71.0 in | Wt 180.6 lb

## 2013-02-08 DIAGNOSIS — R222 Localized swelling, mass and lump, trunk: Secondary | ICD-10-CM

## 2013-02-08 NOTE — Progress Notes (Signed)
Patient ID: [name], male   DOB: 12/16/1945, 67 y.o.   MRN: [id number]  Chief Complaint  Patient presents with  . New Evaluation    eval chest wall mass    HPI [name] is a 67 y.o. male.  Patient's request of Dr. Paz for left chest wall mass. This overlies his left pectoralis muscle. It has increased in size in the last 5 months. It's causing mild to moderate discomfort especially lays on it. He has radiation of pain down his left arm he was on this area. No redness or drainage. HPI  Past Medical History  Diagnosis Date  . Depression   . 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 Coronary atherosclerosis of native coronary artery     Multivessel, stent LAD 1998, thrombectomy 2006, PTCA 2011, NSTEMI 6/12 managed medically  . COPD, mild      PFTs 12/2008  . Hyperlipidemia   . Colon polyp     three 2-4mm polyps in descending colon, medium sized lipoma in sigmoid colon,  multiple 205mm in recto-sigmoid colon  . NSTEMI (non-ST elevated myocardial infarction) 02-2011    Past Surgical History  Procedure Laterality Date  . Appendectomy    . Tonsillectomy    . Lumbar fusion      x5 Dr. Hersch  . 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 Coronary stent placement  1998    placed in the LAD  . Thrombectomy  02/2005    acute non ST segment elevation myocardial infarction  . Coronary stent placement  02/2005    PCI/drug-eluting stent implantation mid-LAD  . Balloon angioplasty, artery  04/2010    sp PTCA  . Nasal fracture surgery      x2  . Inguinal hernia repair      x3    Family History  Problem Relation Age of Onset  . Heart attack Mother   . Lung cancer Father     +smoker  . Diabetes      GM, aunt, other memebers  . Prostate cancer Neg Hx        . Colon cancer Neg Hx     Social History History  Substance Use Topics  . Smoking status: Current Every Day Smoker -- 0.25  packs/day    Types: Cigarettes    Last Attempt to Quit: 09/23/2010  . Smokeless tobacco: Never Used  . Alcohol Use: No     Comment: Rarely     Allergies  Allergen Reactions  . Acetaminophen     REACTION: tongue swells  . Codeine     REACTION: Makes "him mean"  . Hydrocodone-Acetaminophen     REACTION: nausea, vomiting "makes me deathly sick"  . Tetracycline     REACTION: loss of balance  . Vicodin (Hydrocodone-Acetaminophen)     Current Outpatient Prescriptions  Medication Sig Dispense Refill  . aspirin EC 325 MG tablet Take 1 tablet (325 mg total) by mouth daily.      . ezetimibe (ZETIA) 10 MG tablet Take 1 tablet (10 mg total) by mouth daily.  90 tablet  1  . nitroGLYCERIN (NITROSTAT) 0.4 MG SL tablet Place 1 tablet (0.4 mg total) under the tongue as directed. 1 tablet under tongue at onset of chest pain; you may repeat every  Minutes for up to 3 doses  25 tablet  3  . sertraline (  ZOLOFT) 50 MG tablet Take 1 tablet (50 mg total) by mouth daily.  30 tablet  1  . traZODone (DESYREL) 150 MG tablet Take 2 tablets (300 mg total) by mouth at bedtime. 2 by mouth at bedtime  180 tablet  1   No current facility-administered medications for this visit.    Review of Systems Review of Systems  Constitutional: Negative for fever, chills and unexpected weight change.  HENT: Negative for hearing loss, congestion, sore throat, trouble swallowing and voice change.   Eyes: Negative for visual disturbance.  Respiratory: Negative for cough and wheezing.   Cardiovascular: Negative for chest pain, palpitations and leg swelling.  Gastrointestinal: Negative for nausea, vomiting, abdominal pain, diarrhea, constipation, blood in stool, abdominal distention, anal bleeding and rectal pain.  Genitourinary: Negative for hematuria and difficulty urinating.  Musculoskeletal: Negative for arthralgias.  Skin: Negative for rash and wound.  Neurological: Negative for seizures, syncope, weakness and  headaches.  Hematological: Negative for adenopathy. Does not bruise/bleed easily.  Psychiatric/Behavioral: Negative for confusion.    Blood pressure 110/72, pulse 62, temperature 97.4 F (36.3 C), temperature source Temporal, resp. rate 18, height 5' 11" (1.803 m), weight 180 lb 9.6 oz (81.92 kg).  Physical Exam Physical Exam  Constitutional: He is oriented to person, place, and time. He appears well-developed and well-nourished.  HENT:  Head: Normocephalic and atraumatic.  Eyes: EOM are normal. Pupils are equal, round, and reactive to light.  Neck: Normal range of motion. Neck supple.  Cardiovascular: Normal rate and regular rhythm.   Pulmonary/Chest: Effort normal and breath sounds normal.    Musculoskeletal: Normal range of motion.  Neurological: He is alert and oriented to person, place, and time.  Skin: Skin is warm and dry.  Psychiatric: He has a normal mood and affect. His behavior is normal. Judgment and thought content normal.    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  Alternative therapies have been discussed with the patient.  Operative risks include bleeding,  Infection,  Organ injury,  Nerve injury,  Blood vessel injury,  DVT,  Pulmonary embolism,  Death,  And possible reoperation.  Medical management risks include worsening of present situation.  The success of the procedure is 50 -90 % at treating patients symptoms.  The patient understands and agrees to proceed.       Mayford Alberg A. 02/08/2013, 3:44 PM    

## 2013-02-08 NOTE — Patient Instructions (Signed)
Lipoma  A lipoma is a noncancerous (benign) tumor composed of fat cells. They are usually found under the skin (subcutaneous). A lipoma may occur in any tissue of the body that contains fat. Common areas for lipomas to appear include the back, shoulders, buttocks, and thighs. Lipomas are a very common soft tissue growth. They are soft and grow slowly. Most problems caused by a lipoma depend on where it is growing.  DIAGNOSIS   A lipoma can be diagnosed with a physical exam. These tumors rarely become cancerous, but radiographic studies can help determine this for certain. Studies used may include:   Computerized X-ray scans (CT or CAT scan).   Computerized magnetic scans (MRI).  TREATMENT   Small lipomas that are not causing problems may be watched. If a lipoma continues to enlarge or causes problems, removal is often the best treatment. Lipomas can also be removed to improve appearance. Surgery is done to remove the fatty cells and the surrounding capsule. Most often, this is done with medicine that numbs the area (local anesthetic). The removed tissue is examined under a microscope to make sure it is not cancerous. Keep all follow-up appointments with your caregiver.  SEEK MEDICAL CARE IF:    The lipoma becomes larger or hard.   The lipoma becomes painful, red, or increasingly swollen. These could be signs of infection or a more serious condition.  Document Released: 08/30/2002 Document Revised: 12/02/2011 Document Reviewed: 02/09/2010  [employer] Patient Information 2013 [employer].

## 2013-03-03 ENCOUNTER — Encounter (HOSPITAL_BASED_OUTPATIENT_CLINIC_OR_DEPARTMENT_OTHER): Payer: Self-pay | Admitting: *Deleted

## 2013-03-03 ENCOUNTER — Encounter (HOSPITAL_BASED_OUTPATIENT_CLINIC_OR_DEPARTMENT_OTHER)
Admission: RE | Admit: 2013-03-03 | Discharge: 2013-03-03 | Disposition: A | Discharge status: Home / Self Care | Payer: Medicare Other | Source: Ambulatory Visit | Attending: Surgery | Admitting: Surgery

## 2013-03-03 LAB — BASIC METABOLIC PANEL
BUN: 14 mg/dL (ref 6–23)
CO2: 27 mEq/L (ref 19–32)
Chloride: 107 mEq/L (ref 96–112)
Creatinine, Ser: 1.02 mg/dL (ref 0.50–1.35)
GFR calc Af Amer: 86 mL/min — ABNORMAL LOW (ref >90)
GFR calc non Af Amer: 74 mL/min — ABNORMAL LOW (ref >90)

## 2013-03-03 NOTE — Progress Notes (Signed)
Did review notes with dr frederick-ok for dsc

## 2013-03-03 NOTE — Progress Notes (Signed)
Pt has hx cad with stents-saw dr Sanjuana Kava last 7/13-last ekg-12/13-does not fu with cardology well-but sees dr Drue Novel regularly-last 5/14 Needs bmet-he was taken off cardiac meds due to low bp-

## 2013-03-09 ENCOUNTER — Encounter (HOSPITAL_BASED_OUTPATIENT_CLINIC_OR_DEPARTMENT_OTHER): Payer: Self-pay | Admitting: *Deleted

## 2013-03-09 ENCOUNTER — Encounter (HOSPITAL_BASED_OUTPATIENT_CLINIC_OR_DEPARTMENT_OTHER)
Admission: RE | Disposition: A | Discharge status: Home / Self Care | Payer: Self-pay | Source: Ambulatory Visit | Attending: Surgery

## 2013-03-09 ENCOUNTER — Ambulatory Visit (HOSPITAL_BASED_OUTPATIENT_CLINIC_OR_DEPARTMENT_OTHER)
Admission: RE | Admit: 2013-03-09 | Discharge: 2013-03-09 | Disposition: A | Discharge status: Home / Self Care | Payer: Medicare Other | Source: Ambulatory Visit | Attending: Surgery | Admitting: Surgery

## 2013-03-09 ENCOUNTER — Ambulatory Visit (HOSPITAL_BASED_OUTPATIENT_CLINIC_OR_DEPARTMENT_OTHER): Payer: Medicare Other | Admitting: *Deleted

## 2013-03-09 DIAGNOSIS — Z8673 Personal history of transient ischemic attack (TIA), and cerebral infarction without residual deficits: Secondary | ICD-10-CM | POA: Insufficient documentation

## 2013-03-09 DIAGNOSIS — Z91148 Patient's other noncompliance with medication regimen for other reason: Secondary | ICD-10-CM

## 2013-03-09 DIAGNOSIS — Z860100 Personal history of colon polyps, unspecified: Secondary | ICD-10-CM | POA: Insufficient documentation

## 2013-03-09 DIAGNOSIS — I2510 Atherosclerotic heart disease of native coronary artery without angina pectoris: Secondary | ICD-10-CM

## 2013-03-09 DIAGNOSIS — I739 Peripheral vascular disease, unspecified: Secondary | ICD-10-CM

## 2013-03-09 DIAGNOSIS — R634 Abnormal weight loss: Secondary | ICD-10-CM

## 2013-03-09 DIAGNOSIS — Z8679 Personal history of other diseases of the circulatory system: Secondary | ICD-10-CM

## 2013-03-09 DIAGNOSIS — F329 Major depressive disorder, single episode, unspecified: Secondary | ICD-10-CM | POA: Insufficient documentation

## 2013-03-09 DIAGNOSIS — Z9114 Patient's other noncompliance with medication regimen: Secondary | ICD-10-CM

## 2013-03-09 DIAGNOSIS — J449 Chronic obstructive pulmonary disease, unspecified: Secondary | ICD-10-CM

## 2013-03-09 DIAGNOSIS — F3289 Other specified depressive episodes: Secondary | ICD-10-CM

## 2013-03-09 DIAGNOSIS — R911 Solitary pulmonary nodule: Secondary | ICD-10-CM

## 2013-03-09 DIAGNOSIS — M549 Dorsalgia, unspecified: Secondary | ICD-10-CM

## 2013-03-09 DIAGNOSIS — I252 Old myocardial infarction: Secondary | ICD-10-CM | POA: Insufficient documentation

## 2013-03-09 DIAGNOSIS — R079 Chest pain, unspecified: Secondary | ICD-10-CM

## 2013-03-09 DIAGNOSIS — F17200 Nicotine dependence, unspecified, uncomplicated: Secondary | ICD-10-CM

## 2013-03-09 DIAGNOSIS — R7303 Prediabetes: Secondary | ICD-10-CM

## 2013-03-09 DIAGNOSIS — E785 Hyperlipidemia, unspecified: Secondary | ICD-10-CM

## 2013-03-09 DIAGNOSIS — D1739 Benign lipomatous neoplasm of skin and subcutaneous tissue of other sites: Secondary | ICD-10-CM

## 2013-03-09 DIAGNOSIS — Z0000 Encounter for general adult medical examination without abnormal findings: Secondary | ICD-10-CM

## 2013-03-09 DIAGNOSIS — R222 Localized swelling, mass and lump, trunk: Secondary | ICD-10-CM

## 2013-03-09 DIAGNOSIS — J4489 Other specified chronic obstructive pulmonary disease: Secondary | ICD-10-CM

## 2013-03-09 HISTORY — DX: Complete loss of teeth, unspecified cause, unspecified class: K08.109

## 2013-03-09 HISTORY — PX: MASS EXCISION: SHX2000

## 2013-03-09 HISTORY — DX: Complete loss of teeth, unspecified cause, unspecified class: Z97.2

## 2013-03-09 HISTORY — DX: Presence of spectacles and contact lenses: Z97.3

## 2013-03-09 SURGERY — EXCISION MASS
Anesthesia: General | Site: Chest | Laterality: Left | Wound class: Clean

## 2013-03-09 MED ORDER — FENTANYL CITRATE 0.05 MG/ML IJ SOLN
INTRAMUSCULAR | Status: DC | PRN
  Administered 2013-03-09: 100 ug via INTRAVENOUS

## 2013-03-09 MED ORDER — PROMETHAZINE HCL 25 MG/ML IJ SOLN: 6.2500 mg | INTRAMUSCULAR | Status: DC | PRN

## 2013-03-09 MED ORDER — TRAMADOL HCL 50 MG PO TABS: 50.0000 mg | ORAL_TABLET | Freq: Four times a day (QID) | ORAL | Status: DC | PRN

## 2013-03-09 MED ORDER — DEXAMETHASONE SODIUM PHOSPHATE 4 MG/ML IJ SOLN
INTRAMUSCULAR | Status: DC | PRN
  Administered 2013-03-09: 8 mg via INTRAVENOUS

## 2013-03-09 MED ORDER — MEPERIDINE HCL 25 MG/ML IJ SOLN: 6.2500 mg | INTRAMUSCULAR | Status: DC | PRN

## 2013-03-09 MED ORDER — FENTANYL CITRATE 0.05 MG/ML IJ SOLN: 25.0000 ug | INTRAMUSCULAR | Status: DC | PRN

## 2013-03-09 MED ORDER — CHLORHEXIDINE GLUCONATE 4 % EX LIQD: 1.0000 "application " | Freq: Once | CUTANEOUS | Status: DC

## 2013-03-09 MED ORDER — ONDANSETRON HCL 4 MG/2ML IJ SOLN
INTRAMUSCULAR | Status: DC | PRN
  Administered 2013-03-09: 4 mg via INTRAVENOUS

## 2013-03-09 MED ORDER — BUPIVACAINE-EPINEPHRINE 0.25% -1:200000 IJ SOLN
INTRAMUSCULAR | Status: DC | PRN
  Administered 2013-03-09: 20 mL

## 2013-03-09 MED ORDER — DEXTROSE 5 % IV SOLN
3.0000 g | INTRAVENOUS | Status: AC
  Administered 2013-03-09: 2 g via INTRAVENOUS

## 2013-03-09 MED ORDER — PROPOFOL 10 MG/ML IV BOLUS
INTRAVENOUS | Status: DC | PRN
  Administered 2013-03-09: 150 mg via INTRAVENOUS

## 2013-03-09 MED ORDER — LIDOCAINE HCL (CARDIAC) 20 MG/ML IV SOLN
INTRAVENOUS | Status: DC | PRN
  Administered 2013-03-09: 100 mg via INTRAVENOUS

## 2013-03-09 MED ORDER — LACTATED RINGERS IV SOLN
INTRAVENOUS | Status: DC
  Administered 2013-03-09: 07:00:00 via INTRAVENOUS

## 2013-03-09 SURGICAL SUPPLY — 41 items
BENZOIN TINCTURE PRP APPL 2/3 (GAUZE/BANDAGES/DRESSINGS) IMPLANT
BLADE SURG 10 STRL SS (BLADE) IMPLANT
BLADE SURG 15 STRL LF DISP TIS (BLADE) ×1 IMPLANT
BLADE SURG 15 STRL SS (BLADE) ×1
CANISTER SUCTION 1200CC (MISCELLANEOUS) IMPLANT
CHLORAPREP W/TINT 26ML (MISCELLANEOUS) ×2 IMPLANT
COVER MAYO STAND STRL (DRAPES) ×2 IMPLANT
COVER TABLE BACK 60X90 (DRAPES) ×2 IMPLANT
DECANTER SPIKE VIAL GLASS SM (MISCELLANEOUS) IMPLANT
DERMABOND ADVANCED (GAUZE/BANDAGES/DRESSINGS) ×1
DERMABOND ADVANCED .7 DNX12 (GAUZE/BANDAGES/DRESSINGS) ×1 IMPLANT
DRAPE PED LAPAROTOMY (DRAPES) ×2 IMPLANT
DRAPE UTILITY XL STRL (DRAPES) ×2 IMPLANT
ELECT COATED BLADE 2.86 ST (ELECTRODE) ×2 IMPLANT
ELECT REM PT RETURN 9FT ADLT (ELECTROSURGICAL) ×2
ELECTRODE REM PT RTRN 9FT ADLT (ELECTROSURGICAL) ×1 IMPLANT
GLOVE BIO SURGEON STRL SZ7 (GLOVE) ×4 IMPLANT
GLOVE BIOGEL PI IND STRL 7.0 (GLOVE) ×2 IMPLANT
GLOVE BIOGEL PI IND STRL 8 (GLOVE) ×1 IMPLANT
GLOVE BIOGEL PI INDICATOR 7.0 (GLOVE) ×2
GLOVE BIOGEL PI INDICATOR 8 (GLOVE) ×1
GLOVE ECLIPSE 8.0 STRL XLNG CF (GLOVE) ×2 IMPLANT
GOWN PREVENTION PLUS XLARGE (GOWN DISPOSABLE) ×6 IMPLANT
NEEDLE HYPO 25X1 1.5 SAFETY (NEEDLE) ×2 IMPLANT
NS IRRIG 1000ML POUR BTL (IV SOLUTION) ×2 IMPLANT
PACK BASIN DAY SURGERY FS (CUSTOM PROCEDURE TRAY) ×2 IMPLANT
PENCIL BUTTON HOLSTER BLD 10FT (ELECTRODE) ×2 IMPLANT
SLEEVE SCD COMPRESS KNEE MED (MISCELLANEOUS) ×2 IMPLANT
SPONGE LAP 4X18 X RAY DECT (DISPOSABLE) IMPLANT
STAPLER VISISTAT 35W (STAPLE) IMPLANT
STRIP CLOSURE SKIN 1/2X4 (GAUZE/BANDAGES/DRESSINGS) IMPLANT
SUT MON AB 4-0 PC3 18 (SUTURE) ×2 IMPLANT
SUT VIC AB 2-0 SH 18 (SUTURE) ×2 IMPLANT
SUT VIC AB 3-0 SH 27 (SUTURE) ×1
SUT VIC AB 3-0 SH 27X BRD (SUTURE) ×1 IMPLANT
SUT VICRYL AB 3 0 TIES (SUTURE) IMPLANT
SYR CONTROL 10ML LL (SYRINGE) ×2 IMPLANT
TOWEL OR 17X24 6PK STRL BLUE (TOWEL DISPOSABLE) ×4 IMPLANT
TOWEL OR NON WOVEN STRL DISP B (DISPOSABLE) ×2 IMPLANT
TUBE CONNECTING 20X1/4 (TUBING) IMPLANT
YANKAUER SUCT BULB TIP NO VENT (SUCTIONS) IMPLANT

## 2013-03-09 NOTE — Op Note (Signed)
Preoperative diagnosis: Lipoma subcutaneous left chest measuring 5 x 7 cm  Postoperative diagnosis: Lipoma left chest 11 x 7 cm  Procedure: Excision left chest subcutaneous lipoma  Surgeon: Harriette Bouillon M.D.  Anesthesia: LMA 0.25% Sensorcaine with epinephrine  EBL: Minimal  IV fluids: 500 cc crystalloid   Drains: none  Indications for procedure: Patient presents with left chest lipoma. It is causing discomfort and growing in size. He wishes excision.The procedure has been discussed with the patient.  Alternative therapies have been discussed with the patient.  Operative risks include bleeding,  Infection,  Organ injury,  Nerve injury,  Blood vessel injury,  DVT,  Pulmonary embolism,  Death,  And possible reoperation.  Medical management risks include worsening of present situation.  The success of the procedure is 50 -90 % at treating patients symptoms.  The patient understands and agrees to proceed.  Description of procedure: Patient met in holding area and questions answered. Mass left chest marked. Area prepped and draped in a sterile fashion. Timeout was done. Preop antibiotics given. 0.25% Sensorcaine injected in the skin overlying the mass. Transverse incision was made and mass excised in its entirety. Wound closed with deep layer of 3-0 Vicryl and 4-0 Monocryl. Dermabond applied. All final counts correct. Patient awoke taken recovery in satisfactory condition. In the and

## 2013-03-09 NOTE — H&P (View-Only) (Signed)
Patient ID: [name], male   DOB: October 11, 1945, 67 y.o.   MRN: [id number]  Chief Complaint  Patient presents with  . New Evaluation    eval chest wall mass    HPI [name] is a 67 y.o. male.  Patient's request of Dr. Drue Novel for left chest wall mass. This overlies his left pectoralis muscle. It has increased in size in the last 5 months. It's causing mild to moderate discomfort especially lays on it. He has radiation of pain down his left arm he was on this area. No redness or drainage. HPI  Past Medical History  Diagnosis Date  . Depression   . 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 Coronary atherosclerosis of native coronary artery     Multivessel, stent LAD 1998, thrombectomy 2006, PTCA 2011, NSTEMI 6/12 managed medically  . COPD, mild      PFTs 12/2008  . Hyperlipidemia   . Colon polyp     three 2-20mm polyps in descending colon, medium sized lipoma in sigmoid colon,  multiple in recto-sigmoid colon  . NSTEMI (non-ST elevated myocardial infarction) 02-2011    Past Surgical History  Procedure Laterality Date  . Appendectomy    . Tonsillectomy    . Lumbar fusion      x5 Dr. Samara Deist  . 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 Coronary stent placement  1998    placed in the LAD  . Thrombectomy  02/2005    acute non ST segment elevation myocardial infarction  . Coronary stent placement  02/2005    PCI/drug-eluting stent implantation mid-LAD  . Balloon angioplasty, artery  04/2010    sp PTCA  . Nasal fracture surgery      x2  . Inguinal hernia repair      x3    Family History  Problem Relation Age of Onset  . Heart attack Mother   . Lung cancer Father     +smoker  . Diabetes      GM, aunt, other memebers  . Prostate cancer Neg Hx        . Colon cancer Neg Hx     Social History History  Substance Use Topics  . Smoking status: Current Every Day Smoker -- 0.25  packs/day    Types: Cigarettes    Last Attempt to Quit: 09/23/2010  . Smokeless tobacco: Never Used  . Alcohol Use: No     Comment: Rarely     Allergies  Allergen Reactions  . Acetaminophen     REACTION: tongue swells  . Codeine     REACTION: Makes "him mean"  . Hydrocodone-Acetaminophen     REACTION: nausea, vomiting "makes me deathly sick"  . Tetracycline     REACTION: loss of balance  . Vicodin (Hydrocodone-Acetaminophen)     Current Outpatient Prescriptions  Medication Sig Dispense Refill  . aspirin EC 325 MG tablet Take 1 tablet (325 mg total) by mouth daily.      Marland Kitchen ezetimibe (ZETIA) 10 MG tablet Take 1 tablet (10 mg total) by mouth daily.  90 tablet  1  . nitroGLYCERIN (NITROSTAT) 0.4 MG SL tablet Place 1 tablet (0.4 mg total) under the tongue as directed. 1 tablet under tongue at onset of chest pain; you may repeat every  Minutes for up to 3 doses  25 tablet  3  . sertraline (  ZOLOFT) 50 MG tablet Take 1 tablet (50 mg total) by mouth daily.  30 tablet  1  . traZODone (DESYREL) 150 MG tablet Take 2 tablets (300 mg total) by mouth at bedtime. 2 by mouth at bedtime  180 tablet  1   No current facility-administered medications for this visit.    Review of Systems Review of Systems  Constitutional: Negative for fever, chills and unexpected weight change.  HENT: Negative for hearing loss, congestion, sore throat, trouble swallowing and voice change.   Eyes: Negative for visual disturbance.  Respiratory: Negative for cough and wheezing.   Cardiovascular: Negative for chest pain, palpitations and leg swelling.  Gastrointestinal: Negative for nausea, vomiting, abdominal pain, diarrhea, constipation, blood in stool, abdominal distention, anal bleeding and rectal pain.  Genitourinary: Negative for hematuria and difficulty urinating.  Musculoskeletal: Negative for arthralgias.  Skin: Negative for rash and wound.  Neurological: Negative for seizures, syncope, weakness and  headaches.  Hematological: Negative for adenopathy. Does not bruise/bleed easily.  Psychiatric/Behavioral: Negative for confusion.    Blood pressure 110/72, pulse 62, temperature 97.4 F (36.3 C), temperature source Temporal, resp. rate 18, height 5\' 11"  (1.803 m), weight 180 lb 9.6 oz (81.92 kg).  Physical Exam Physical Exam  Constitutional: He is oriented to person, place, and time. He appears well-developed and well-nourished.  HENT:  Head: Normocephalic and atraumatic.  Eyes: EOM are normal. Pupils are equal, round, and reactive to light.  Neck: Normal range of motion. Neck supple.  Cardiovascular: Normal rate and regular rhythm.   Pulmonary/Chest: Effort normal and breath sounds normal.    Musculoskeletal: Normal range of motion.  Neurological: He is alert and oriented to person, place, and time.  Skin: Skin is warm and dry.  Psychiatric: He has a normal mood and affect. His behavior is normal. Judgment and thought content normal.    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  Alternative therapies have been discussed with the patient.  Operative risks include bleeding,  Infection,  Organ injury,  Nerve injury,  Blood vessel injury,  DVT,  Pulmonary embolism,  Death,  And possible reoperation.  Medical management risks include worsening of present situation.  The success of the procedure is 50 -90 % at treating patients symptoms.  The patient understands and agrees to proceed.       [name] A. 02/08/2013, 3:44 PM

## 2013-03-09 NOTE — Transfer of Care (Signed)
Immediate Anesthesia Transfer of Care Note  Patient: [name]  Procedure(s) Performed: Procedure(s): EXCISION left chest wall MASS (Left)  Patient Location: PACU  Anesthesia Type:General  Level of Consciousness: awake, alert  and oriented  Airway & Oxygen Therapy: Patient Spontanous Breathing and Patient connected to face mask oxygen  Post-op Assessment: Report given to PACU RN, Post -op Vital signs reviewed and stable and Patient moving all extremities  Post vital signs: Reviewed and stable  Complications: No apparent anesthesia complications

## 2013-03-09 NOTE — Anesthesia Preprocedure Evaluation (Signed)
Anesthesia Evaluation  Patient identified by MRN, date of birth, ID band Patient awake    Reviewed: Allergy & Precautions, H&P , NPO status , Patient's Chart, lab work & pertinent test results  Airway Mallampati: I      Dental  (+) Edentulous Upper and Edentulous Lower   Pulmonary COPDCurrent Smoker,  breath sounds clear to auscultation        Cardiovascular + CAD, + Past MI and + Cardiac Stents Rhythm:Regular Rate:Normal  Hx stents   Neuro/Psych CVA    GI/Hepatic negative GI ROS, Neg liver ROS,   Endo/Other  negative endocrine ROS  Renal/GU negative Renal ROS     Musculoskeletal negative musculoskeletal ROS (+)   Abdominal   Peds  Hematology negative hematology ROS (+)   Anesthesia Other Findings   Reproductive/Obstetrics                           Anesthesia Physical Anesthesia Plan  ASA: III  Anesthesia Plan: MAC and General   Post-op Pain Management:    Induction: Intravenous  Airway Management Planned: LMA  Additional Equipment:   Intra-op Plan:   Post-operative Plan: Extubation in OR  Informed Consent: I have reviewed the patients History and Physical, chart, labs and discussed the procedure including the risks, benefits and alternatives for the proposed anesthesia with the patient or authorized representative who has indicated his/her understanding and acceptance.     Plan Discussed with: CRNA and Surgeon  Anesthesia Plan Comments:         Anesthesia Quick Evaluation

## 2013-03-09 NOTE — Anesthesia Postprocedure Evaluation (Signed)
  Anesthesia Post-op Note  Patient: [name]  Procedure(s) Performed: Procedure(s): EXCISION left chest wall MASS (Left)  Patient Location: PACU  Anesthesia Type:General  Level of Consciousness: awake and sedated  Airway and Oxygen Therapy: Patient Spontanous Breathing  Post-op Pain: mild  Post-op Assessment: Post-op Vital signs reviewed  Post-op Vital Signs: stable  Complications: No apparent anesthesia complications

## 2013-03-09 NOTE — Interval H&P Note (Signed)
History and Physical Interval Note:  03/09/2013 7:16 AM  [name]  has presented today for surgery, with the diagnosis of left chest wall mass  The various methods of treatment have been discussed with the patient and family. After consideration of risks, benefits and other options for treatment, the patient has consented to  Procedure(s): EXCISION left chest wall MASS (N/A) as a surgical intervention .  The patient's history has been reviewed, patient examined, no change in status, stable for surgery.  I have reviewed the patient's chart and labs.  Questions were answered to the patient's satisfaction.     [name] A.

## 2013-03-10 ENCOUNTER — Encounter (HOSPITAL_BASED_OUTPATIENT_CLINIC_OR_DEPARTMENT_OTHER): Payer: Self-pay | Admitting: Surgery

## 2013-03-10 ENCOUNTER — Telehealth (INDEPENDENT_AMBULATORY_CARE_PROVIDER_SITE_OTHER): Payer: Self-pay

## 2013-03-10 NOTE — Telephone Encounter (Signed)
Pt notified of benign (lipoma) pathology results.

## 2013-03-29 ENCOUNTER — Ambulatory Visit (INDEPENDENT_AMBULATORY_CARE_PROVIDER_SITE_OTHER): Payer: Medicare Other | Admitting: Surgery

## 2013-03-29 ENCOUNTER — Encounter (INDEPENDENT_AMBULATORY_CARE_PROVIDER_SITE_OTHER): Payer: Self-pay | Admitting: Surgery

## 2013-03-29 VITALS — BP 128/68 | HR 70 | Temp 98.4°F | Resp 16 | Ht 71.0 in | Wt 183.2 lb

## 2013-03-29 DIAGNOSIS — Z9889 Other specified postprocedural states: Secondary | ICD-10-CM

## 2013-03-29 NOTE — Progress Notes (Signed)
The patient returns after excision of left chest lipoma. Pathology was benign.  Exam: Incision to left chest clean dry and intact without signs of infection or seroma.  Impression: Status post excision left chest wall mass pathology showed lipoma subcutaneous  Plan: Resume full activity without restriction. Return as needed

## 2013-03-29 NOTE — Patient Instructions (Signed)
Resume full activity.  No restrictions.   

## 2013-04-08 ENCOUNTER — Other Ambulatory Visit: Payer: Self-pay | Admitting: Orthopedic Surgery

## 2013-04-08 DIAGNOSIS — M545 Low back pain: Secondary | ICD-10-CM

## 2013-04-11 ENCOUNTER — Other Ambulatory Visit: Payer: Medicare Other

## 2013-04-16 ENCOUNTER — Ambulatory Visit
Admission: RE | Admit: 2013-04-16 | Discharge: 2013-04-16 | Disposition: A | Discharge status: Home / Self Care | Payer: Medicare Other | Source: Ambulatory Visit | Attending: Orthopedic Surgery | Admitting: Orthopedic Surgery

## 2013-04-16 DIAGNOSIS — M545 Low back pain: Secondary | ICD-10-CM

## 2013-04-16 MED ORDER — GADOBENATE DIMEGLUMINE 529 MG/ML IV SOLN
17.0000 mL | Freq: Once | INTRAVENOUS | Status: AC | PRN
  Administered 2013-04-16: 17 mL via INTRAVENOUS

## 2013-06-29 ENCOUNTER — Other Ambulatory Visit: Payer: Self-pay | Admitting: Neurosurgery

## 2013-06-29 NOTE — Pre-Procedure Instructions (Signed)
Ricardo SUAREZ Sr.  06/29/2013   Your procedure is scheduled on:  Thursday, October 9th.  Report to [facility], Main Entrance/ Entrance "A"at 5:30 AM.  Call this number if you have problems the morning of surgery: [phone]   Remember:   Do not eat food or drink liquids after midnight.   Take these medicines the morning of surgery with A SIP OF WATER: sertraline (ZOLOFT).  Take if needed: methocarbamol (ROBAXIN),  nitroGLYCERIN (NITROSTAT).   Stop taking :meloxicam (MOBIC).     Do not wear jewelry, make-up or nail polish.  Do not wear lotions, powders, or perfumes. You may wear deodorant.  Do not shave 48 hours prior to surgery. Men may shave face and neck.  Do not bring valuables to the hospital.  [facility] is not responsible for any belongings or valuables.               Contacts, dentures or bridgework may not be worn into surgery.  Leave suitcase in the car. After surgery it may be brought to your room.  For patients admitted to the hospital, discharge time is determined by your treatment team.               Patients discharged the day of surgery will not be allowed to drive home.  Name and phone number of your driver:    Special Instructions: Shower with CHG wash (Bactoshield) tonight and again in the am prior to arriving to hospital.   Please read over the following fact sheets that you were given: Pain Booklet, Coughing and Deep Breathing, Blood Transfusion Information and Surgical Site Infection Prevention

## 2013-06-30 ENCOUNTER — Telehealth: Payer: Self-pay | Admitting: Cardiovascular Disease

## 2013-06-30 ENCOUNTER — Encounter (HOSPITAL_COMMUNITY): Payer: Self-pay | Admitting: Vascular Surgery

## 2013-06-30 ENCOUNTER — Encounter (HOSPITAL_COMMUNITY)
Admission: RE | Admit: 2013-06-30 | Discharge: 2013-06-30 | Disposition: A | Discharge status: Home / Self Care | Payer: Medicare Other | Source: Ambulatory Visit | Attending: Neurosurgery | Admitting: Neurosurgery

## 2013-06-30 ENCOUNTER — Encounter (HOSPITAL_COMMUNITY): Payer: Self-pay

## 2013-06-30 ENCOUNTER — Encounter (HOSPITAL_COMMUNITY)
Admission: RE | Admit: 2013-06-30 | Discharge: 2013-06-30 | Disposition: A | Discharge status: Home / Self Care | Payer: Medicare Other | Source: Ambulatory Visit | Attending: Anesthesiology | Admitting: Anesthesiology

## 2013-06-30 ENCOUNTER — Encounter (HOSPITAL_COMMUNITY): Payer: Self-pay | Admitting: Pharmacy Technician

## 2013-06-30 DIAGNOSIS — Z01812 Encounter for preprocedural laboratory examination: Secondary | ICD-10-CM | POA: Insufficient documentation

## 2013-06-30 DIAGNOSIS — Z01818 Encounter for other preprocedural examination: Secondary | ICD-10-CM | POA: Insufficient documentation

## 2013-06-30 HISTORY — DX: Headache: R51

## 2013-06-30 LAB — SURGICAL PCR SCREEN
MRSA, PCR: NEGATIVE
Staphylococcus aureus: NEGATIVE

## 2013-06-30 LAB — CBC WITH DIFFERENTIAL/PLATELET
Basophils Absolute: 0 10*3/uL (ref 0.0–0.1)
Eosinophils Relative: 8 % — ABNORMAL HIGH (ref 0–5)
HCT: 43.8 % (ref 39.0–52.0)
Hemoglobin: 14.7 g/dL (ref 13.0–17.0)
Lymphocytes Relative: 27 % (ref 12–46)
MCV: 93 fL (ref 78.0–100.0)
Monocytes Absolute: 0.4 10*3/uL (ref 0.1–1.0)
Monocytes Relative: 7 % (ref 3–12)
RDW: 13.7 % (ref 11.5–15.5)
WBC: 5.6 10*3/uL (ref 4.0–10.5)

## 2013-06-30 LAB — URINE MICROSCOPIC-ADD ON

## 2013-06-30 LAB — BASIC METABOLIC PANEL WITH GFR
BUN: 13 mg/dL (ref 6–23)
CO2: 30 meq/L (ref 19–32)
Calcium: 8.7 mg/dL (ref 8.4–10.5)
Chloride: 107 meq/L (ref 96–112)
Creatinine, Ser: 0.98 mg/dL (ref 0.50–1.35)
GFR calc Af Amer: >90 mL/min
GFR calc non Af Amer: 83 mL/min — ABNORMAL LOW
Glucose, Bld: 87 mg/dL (ref 70–99)
Potassium: 4 meq/L (ref 3.5–5.1)
Sodium: 142 meq/L (ref 135–145)

## 2013-06-30 LAB — URINALYSIS, ROUTINE W REFLEX MICROSCOPIC
Glucose, UA: NEGATIVE mg/dL
Hgb urine dipstick: NEGATIVE
Ketones, ur: NEGATIVE mg/dL
Nitrite: NEGATIVE
Protein, ur: NEGATIVE mg/dL
Specific Gravity, Urine: 1.029 (ref 1.005–1.030)
Urobilinogen, UA: 0.2 mg/dL (ref 0.0–1.0)
pH: 5 (ref 5.0–8.0)

## 2013-06-30 LAB — TYPE AND SCREEN
ABO/RH(D): O POS
Antibody Screen: NEGATIVE

## 2013-06-30 MED ORDER — CEFAZOLIN SODIUM-DEXTROSE 2-3 GM-% IV SOLR: 2.0000 g | INTRAVENOUS | Status: AC

## 2013-06-30 NOTE — Progress Notes (Signed)
Pt has a history of MI in 2012 and "3" stents in the past.  Was seen by Dr Clifton James, has not seen since 2012.  Pt denies chest pain or SOB.. Pt quit smoking 2 months ago.  Edmonia Caprio notified.

## 2013-06-30 NOTE — Telephone Encounter (Signed)
New problem   Pt is sched for lumbar fusion and they need clearance without making an appt. Please advise

## 2013-06-30 NOTE — Telephone Encounter (Signed)
Spoke with Shanda Bumps at Dr. Doreen Beam office. Pt is scheduled for back surgery tomorrow. This is not an emergency procedure but is an elective surgery for pt's back pain.  Pt was last seen in our office in July 2013 by Tereso Newcomer, PA.  I told Shanda Bumps pt will need office visit before he can be cleared. Appt made for pt to see Tereso Newcomer, PA on July 08, 2013 at 2:40.  Shanda Bumps will contact pt with this appt time when she calls him to cancel surgery scheduled for tomorrow.  Clearance form is being faxed to our office

## 2013-06-30 NOTE — Telephone Encounter (Signed)
Thanks, chris 

## 2013-06-30 NOTE — Progress Notes (Signed)
Anesthesia PAT Evaluation:  Patient is a 67 year old male scheduled for L1-2 PLIF on 07/01/13 by [name].  PAT appointment was 06/30/13.  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  He is s/p left chest lipoma excision under GA/LMA 03/09/13.  PCP is Dr. [name].   Meds: Nitro PRN, ASA 325mg , Zetia, Zoloft, trazadone.  Cardiologist is Dr. Clifton James.  His last cardiology visit was with Tereso Newcomer, PA-C in July 2013.  Notes indicate that he had been taken off B-blocker and ACEI due to bradycardia and hypotension.  He denies chest pain or Nitro use.  He denies SOB at rest, edema, syncope, palpitations, increased fatigue.  He is able to walk his dog 1/2 mile on a daily basis (down from 1 mile due to his severe back pain).  Cardiac cath on 03/04/11 showed: 1. Triple-vessel coronary artery disease with patent stents in the mid circumflex and the proximal left anterior descending artery. Total occlusion in the right coronary artery with filling of the distal vessel via bridging collaterals. EF 60%. 2. Questionable etiology of non-ST elevation myocardial infarction. It is possible this patient had a thrombus present that resolved overnight with heparin therapy.  Medical therapy was recommended.  Echo on 04/24/10 showed: - Left ventricle: The cavity size was normal. Wall thickness was normal. Systolic function was normal. The estimated ejection fraction was in the range of 50% to 55%. Regional wall motion abnormalities  cannot be excluded. Doppler parameters are consistent with abnormal left ventricular relaxation (grade 1 diastolic dysfunction). - Right ventricle: The cavity size was mildly dilated. - Right atrium: The atrium was mildly dilated. - Trivial mitral and tricuspid regurgitation.  EKG on 08/19/12 showed SB @ 61 bpm.  CXR on 06/30/13 showed hyperinflation without acute findings.  Preoperative labs noted.  I reviewed above with anesthesiologist Dr. Sampson Goon.  Patient has a significant CAD history including at least three MI's and two coronary stents.  His LVF was normal in 02/2011.  It has been > 1 years since he has had cardiology evaluation, so anesthesia recommends preoperative cardiology clearance.  Patient is quite upset about this.  I have notified Jessica at Dr. Doreen Beam office.  She will contact Dr. Gibson Ramp office and update patient.  Proceeding with surgery will depend on when he is cleared by cardiology.  Velna Ochs [facility] Short Stay [facility]/Anesthesiology Phone [phone] 06/30/2013 1:09 PM

## 2013-07-01 ENCOUNTER — Encounter (HOSPITAL_COMMUNITY): Admission: RE | Payer: Self-pay | Source: Ambulatory Visit

## 2013-07-01 ENCOUNTER — Inpatient Hospital Stay (HOSPITAL_COMMUNITY): Admission: RE | Admit: 2013-07-01 | Payer: Medicare Other | Source: Ambulatory Visit | Admitting: Neurosurgery

## 2013-07-01 SURGERY — POSTERIOR LUMBAR FUSION 1 LEVEL
Anesthesia: General | Site: Back

## 2013-07-08 ENCOUNTER — Encounter: Payer: Self-pay | Admitting: Physician Assistant

## 2013-07-08 ENCOUNTER — Ambulatory Visit (INDEPENDENT_AMBULATORY_CARE_PROVIDER_SITE_OTHER): Payer: Medicare Other | Admitting: Physician Assistant

## 2013-07-08 VITALS — BP 122/70 | HR 69 | Ht 71.0 in | Wt 192.1 lb

## 2013-07-08 DIAGNOSIS — Z01810 Encounter for preprocedural cardiovascular examination: Secondary | ICD-10-CM

## 2013-07-08 DIAGNOSIS — E785 Hyperlipidemia, unspecified: Secondary | ICD-10-CM

## 2013-07-08 DIAGNOSIS — I2510 Atherosclerotic heart disease of native coronary artery without angina pectoris: Secondary | ICD-10-CM

## 2013-07-08 NOTE — Patient Instructions (Addendum)
Your physician has requested that you have a lexiscan myoview. PER SCOTT WEAVER, PAC NEEDS TO BE DONE ASAP DUE TO PT'S SURGERY HAS BEEN CANCELLED WAITING FOR CLEARANCE FROM CARDIOLOGY. For further information please visit [email or web address] Please follow instruction sheet, as given.  NO CHANGES WERE MADE WITH YOUR MEDICATIONS

## 2013-07-08 NOTE — Progress Notes (Signed)
[address] [place], [place]  [address] Phone: [phone] Fax:  [phone]  Date:  07/08/2013   ID:  [name] Sr., DOB 1946-04-18, MRN [id number]  PCP:  [name], MD  Cardiologist:  [name]     History of Present Illness: Ricardo MALEK Sr. is a 67 y.o. male who returns for surgical clearance. He needs lumbar spine surgery with [name].  He has a hx of CAD, s/p prior LAD stenting 1998, thrombectomy 2006, PTCA 2011, NSTEMI 02/2011 (LHC 03/04/11: pLAD stent patent, mCFX stent patent, OM1 jailed by previously placed stent with ostial 60%-unchanged, chronically occluded RCA with bridging collaterals filling the distal vessel, EF 60%). Culprit for his NSTEMI at that time was unknown. It was hypothesized that there was thrombus formation that spontaneously resolved with heparin pre-catheterization. He was treated medically. 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 Echo 04/2010: EF 50-55%, grade 1 diastolic dysfunction, mild RVE, mild RAE. Patient lost to followup.   Admitted 03/2012 with chest pain and D-dimer was elevated. Chest CT 03/23/12: No pulmonary embolism, coronary atherosclerosis, right upper lobe pulmonary nodule 4.8 mm (followup CT recommended in 6-12 months).  He saw his PCP who is arranging follow up CT.    Follow up Chest CT in 09/2012:   Stable 4.9 mm right middle lobe pulmonary nodule. Recommend one additional follow-up chest CT in 1 year to confirm 2-year stability. If the nodule remains stable at that time, no further imaging followup will be required for this lesion.  Atherosclerosis including coronary artery disease.  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  Ectasia of the ascending aorta to 3.9 cm.  Patient is somewhat limited by back pain. He denies any chest pain or shortness of breath. He denies syncope. He denies orthopnea, PND or edema. It is not clear if he can achieve 4 METs of  activity.  Labs (11/13):   K 4, Cr 0.90, ALT 8 Labs (5/14)      HDL 39.7, LDL 123,  Labs (27/25):   K 4, Cr 0.98, Hgb 14.7,   Wt Readings from Last 3 Encounters:  06/30/13 193 lb 8 oz (87.771 kg)  03/29/13 183 lb 3.2 oz (83.099 kg)  03/09/13 181 lb 2 oz (82.158 kg)     Past Medical History  Diagnosis Date  . Depression   . COPD, mild      PFTs 12/2008  . Hyperlipidemia   . Colon polyp     three 2-42mm polyps in descending colon, medium sized lipoma in sigmoid colon,  multiple in recto-sigmoid colon  . NSTEMI (non-ST elevated myocardial infarction) 02-2011  . Coronary atherosclerosis of native coronary artery     Multivessel, stent LAD 1998, thrombectomy 2006, PTCA 2011, NSTEMI 6/12 managed medically  . Full dentures   . Wears glasses   . 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 DGUYQIHK(742.5)     Current Outpatient Prescriptions  Medication Sig Dispense Refill  . aspirin EC 325 MG tablet Take 1 tablet (325 mg total) by mouth daily.      Marland Kitchen ezetimibe (ZETIA) 10 MG tablet Take 1 tablet (10 mg total) by mouth daily.  90 tablet  1  . nitroGLYCERIN (NITROSTAT) 0.4 MG SL tablet Place 1 tablet (0.4 mg total) under the tongue as directed. 1 tablet under tongue at onset  of chest pain; you may repeat every  Minutes for up to 3 doses  25 tablet  3  . sertraline (ZOLOFT) 50 MG tablet Take 1 tablet (50 mg total) by mouth daily.  30 tablet  1  . traZODone (DESYREL) 150 MG tablet Take 2 tablets (300 mg total) by mouth at bedtime. 2 by mouth at bedtime  180 tablet  1   No current facility-administered medications for this visit.    Allergies:    Allergies  Allergen Reactions  . Acetaminophen     REACTION: tongue swells  . Codeine     REACTION: Makes "him mean"  . Hydrocodone-Acetaminophen     REACTION: nausea, vomiting "makes me deathly sick"  . Tetracycline     REACTION: loss of balance  .  Vicodin [Hydrocodone-Acetaminophen]     Social History:  The patient  reports that he quit smoking about 2 months ago. His smoking use included Cigarettes. He smoked 0.00 packs per day for 25 years. He has never used smokeless tobacco. He reports that he does not drink alcohol or use illicit drugs.   Family History:  The patient's family history includes Diabetes in an other family member; Heart attack in his mother; Lung cancer in his father. There is no history of Prostate cancer or Colon cancer.   ROS:  Please see the history of present illness.   He has chronic back pain.  All other systems reviewed and negative.   PHYSICAL EXAM: VS:  BP 122/70  Pulse 69  Ht 5\' 11"  (1.803 m)  Wt 192 lb 1.9 oz (87.145 kg)  BMI 26.81 kg/m2 Well nourished, well developed, in no acute distress HEENT: normal Neck: no JVD Cardiac:  normal S1, S2; RRR; no murmur Lungs:  clear to auscultation bilaterally, no wheezing, rhonchi or rales Abd: soft, nontender, no hepatomegaly Ext: no edema Skin: warm and dry Neuro:  CNs 2-12 intact, no focal abnormalities noted  EKG:  NSR, HR 69, LAD, nonspecific ST-T wave changes     ASSESSMENT AND PLAN:  1. Surgical Clearance: He needs to undergo an orthopedic procedure which carries intermediate cardiovascular risk. Last assessment of ischemia was in 2012. He is not that active and likely cannot achieve 4 METs. I reviewed his case today with [name] (DOD).  We have recommended proceeding with a Lexiscan Myoview for risk stratification . 2. CAD: Continue aspirin. He is off of statin therapy for unclear reasons. See below.  Procedure with Myoview as noted. 3. Hyperlipidemia:  Management by primary care.  It appears that he was supposed to remain on Crestor in addition to starting Zetia.  He would benefit from remaining on a moderate to high intensity statin. I have asked him to follow up with PCP to clarify.   4. Disposition: Followup with [name] in 6  months.  Signed, Tereso Newcomer, PA-C  07/08/2013 2:48 PM

## 2013-07-09 ENCOUNTER — Telehealth: Payer: Self-pay | Admitting: Cardiovascular Disease

## 2013-07-09 NOTE — Telephone Encounter (Signed)
Received request from Nurse, documents faxed for surgical clearance. To: [facility] Fax number: [phone] Attention: 07/09/13/km

## 2013-07-14 ENCOUNTER — Ambulatory Visit (HOSPITAL_COMMUNITY): Payer: Medicare Other | Attending: Cardiology | Admitting: Radiology

## 2013-07-14 VITALS — BP 138/76 | HR 54 | Ht 71.0 in | Wt 194.0 lb

## 2013-07-14 DIAGNOSIS — Z8673 Personal history of transient ischemic attack (TIA), and cerebral infarction without residual deficits: Secondary | ICD-10-CM | POA: Insufficient documentation

## 2013-07-14 DIAGNOSIS — Z87891 Personal history of nicotine dependence: Secondary | ICD-10-CM | POA: Insufficient documentation

## 2013-07-14 DIAGNOSIS — Z01810 Encounter for preprocedural cardiovascular examination: Secondary | ICD-10-CM | POA: Insufficient documentation

## 2013-07-14 DIAGNOSIS — I2510 Atherosclerotic heart disease of native coronary artery without angina pectoris: Secondary | ICD-10-CM

## 2013-07-14 DIAGNOSIS — Z8249 Family history of ischemic heart disease and other diseases of the circulatory system: Secondary | ICD-10-CM | POA: Insufficient documentation

## 2013-07-14 MED ORDER — TECHNETIUM TC 99M SESTAMIBI GENERIC - CARDIOLITE
30.0000 | Freq: Once | INTRAVENOUS | Status: AC | PRN
  Administered 2013-07-14: 30 via INTRAVENOUS

## 2013-07-14 MED ORDER — REGADENOSON 0.4 MG/5ML IV SOLN
0.4000 mg | Freq: Once | INTRAVENOUS | Status: AC
  Administered 2013-07-14: 0.4 mg via INTRAVENOUS

## 2013-07-14 MED ORDER — TECHNETIUM TC 99M SESTAMIBI GENERIC - CARDIOLITE
10.0000 | Freq: Once | INTRAVENOUS | Status: AC | PRN
  Administered 2013-07-14: 10 via INTRAVENOUS

## 2013-07-14 MED ORDER — DIAZEPAM 5 MG PO TABS: 5.0000 mg | ORAL_TABLET | Freq: Once | ORAL | Status: DC

## 2013-07-14 NOTE — Progress Notes (Signed)
[facility] SITE 3 NUCLEAR MED [address] [place], [place] [address] [phone]    Cardiology Nuclear Med Study  [name] Sr. is a 67 y.o. male     MRN : [id number]     DOB: 1946/08/30  Procedure Date: 07/14/2013  Nuclear Med Background Indication for Stress Test:  Evaluation for Ischemia and Surgical Clearance -Back Surgery- Dr. Greig Castilla History:  2011 MPS- Normal, Echo- EF 55%, Hx CAD,Stent,PTCA Cardiac Risk Factors: CVA, Family History - CAD, History of Smoking and Lipids  Symptoms:  NONE   Nuclear Pre-Procedure Caffeine/Decaff Intake:  None NPO After: 8:30pm   Lungs:  clear O2 Sat: 93% on room air. IV 0.9% NS with Angio Cath:  22g  IV Site: R Hand  IV Started by:  Bonnita Levan, RN  Chest Size (in):  44 Cup Size: n/a  Height: 5\' 11"  (1.803 m)  Weight:  194 lb (87.998 kg)  BMI:  Body mass index is 27.07 kg/(m^2). Tech Comments:  N/A    Nuclear Med Study 1 or 2 day study: 1 day  Stress Test Type:  Lexiscan  Reading MD: Willa Rough, MD  Order Authorizing Provider:  Verne Carrow, MD  Resting Radionuclide: Technetium 53m Sestamibi  Resting Radionuclide Dose: 11.0 mCi   Stress Radionuclide:  Technetium 49m Sestamibi  Stress Radionuclide Dose: 33.0 mCi           Stress Protocol Rest HR: 54 Stress HR: 65  Rest BP: 138/76 Stress BP: 122/81  Exercise Time (min): n/a METS: n/a   Predicted Max HR: 153 bpm % Max HR: 42.48 bpm Rate Pressure Product: 7930   Dose of Adenosine (mg):  n/a Dose of Lexiscan: 0.4 mg  Dose of Atropine (mg): n/a Dose of Dobutamine: n/a mcg/kg/min (at max HR)  Stress Test Technologist: Nelson Chimes, BS-ES  Nuclear Technologist:  Domenic Polite, CNMT     Rest Procedure:  Myocardial perfusion imaging was performed at rest 45 minutes following the intravenous administration of Technetium 51m Sestamibi. Rest ECG: Sinus bradycardia  Stress Procedure:  The patient received IV Lexiscan 0.4 mg over 15-seconds.   Technetium 15m Sestamibi injected at 30-seconds.  Quantitative spect images were obtained after a 45 minute delay. Patient stated he had a "funny feeling" in his chest during the Lexiscan infusion.  Symptoms resolved with recovery. Stress ECG: No significant change from baseline ECG  QPS Raw Data Images:  Patient motion noted; appropriate software correction applied. Stress Images:  Medium-sized area of moderate decreased uptake affecting mid/base inferior segments, apical, apical lateral segment. Rest Images:  Medium-sized area of mild decreased uptake affecting the mid/base inferior segments, apical, apical lateral segment. Subtraction (SDS):  Some reversibility in the mid/base inferior segments. Transient Ischemic Dilatation (Normal <1.22):  0.86 Lung/Heart Ratio (Normal <0.45):  0.33  Quantitative Gated Spect Images QGS EDV:  117 ml QGS ESV:  65 ml  Impression Exercise Capacity:  Lexiscan with no exercise. BP Response:  Normal blood pressure response. Clinical Symptoms:  Funny feeling in chest ECG Impression:  No significant ST segment change suggestive of ischemia. Comparison with Prior Nuclear Study: Study is compared to the report from September, 2002  Overall Impression:  The patient had a great deal of motion during this study. Motion correction protocol is used. The data is still noisy. Overall it is a difficult study to interpret. I have compared the data to the report from a study from September, 2002. It appears that there is probably no significant change.  There is some inferior scar that is old. There may be some peri-infarct ischemia at this time. This is a low risk scan.  LV Ejection Fraction: 45%.  LV Wall Motion:  There is some hypokinesis of the inferior wall.  Willa Rough, MD

## 2013-07-15 ENCOUNTER — Encounter: Payer: Self-pay | Admitting: Physician Assistant

## 2013-07-15 ENCOUNTER — Telehealth: Payer: Self-pay | Admitting: *Deleted

## 2013-07-15 NOTE — Telephone Encounter (Signed)
pt notified about myoview results and ok to proceed with surgery. Pt states surgeon is [name]. I will fax results to surgeon today

## 2013-07-20 ENCOUNTER — Other Ambulatory Visit: Payer: Self-pay | Admitting: Neurosurgery

## 2013-07-26 ENCOUNTER — Encounter (HOSPITAL_COMMUNITY): Payer: Medicare Other

## 2013-07-29 ENCOUNTER — Encounter (HOSPITAL_COMMUNITY): Payer: Self-pay | Admitting: Pharmacy Technician

## 2013-07-30 ENCOUNTER — Ambulatory Visit: Payer: Medicare Other | Admitting: Internal Medicine

## 2013-07-30 ENCOUNTER — Encounter (HOSPITAL_COMMUNITY)
Admission: RE | Admit: 2013-07-30 | Discharge: 2013-07-30 | Disposition: A | Discharge status: Home / Self Care | Payer: Medicare Other | Source: Ambulatory Visit | Attending: Neurosurgery | Admitting: Neurosurgery

## 2013-07-30 ENCOUNTER — Encounter (HOSPITAL_COMMUNITY): Payer: Self-pay

## 2013-07-30 DIAGNOSIS — Z01812 Encounter for preprocedural laboratory examination: Secondary | ICD-10-CM | POA: Insufficient documentation

## 2013-07-30 LAB — URINALYSIS, ROUTINE W REFLEX MICROSCOPIC
Ketones, ur: NEGATIVE mg/dL
Protein, ur: NEGATIVE mg/dL
Specific Gravity, Urine: 1.025 (ref 1.005–1.030)
Urobilinogen, UA: 0.2 mg/dL (ref 0.0–1.0)

## 2013-07-30 LAB — CBC WITH DIFFERENTIAL/PLATELET
Basophils Absolute: 0 10*3/uL (ref 0.0–0.1)
Eosinophils Relative: 7 % — ABNORMAL HIGH (ref 0–5)
Lymphocytes Relative: 26 % (ref 12–46)
Lymphs Abs: 1.5 10*3/uL (ref 0.7–4.0)
MCHC: 34.4 g/dL (ref 30.0–36.0)
MCV: 92.5 fL (ref 78.0–100.0)
Neutro Abs: 3.3 10*3/uL (ref 1.7–7.7)
Neutrophils Relative %: 58 % (ref 43–77)
Platelets: 190 10*3/uL (ref 150–400)
RBC: 5.09 MIL/uL (ref 4.22–5.81)
RDW: 14.1 % (ref 11.5–15.5)
WBC: 5.7 10*3/uL (ref 4.0–10.5)

## 2013-07-30 LAB — BASIC METABOLIC PANEL
CO2: 25 mEq/L (ref 19–32)
Calcium: 9.3 mg/dL (ref 8.4–10.5)
Chloride: 106 mEq/L (ref 96–112)
Creatinine, Ser: 1.03 mg/dL (ref 0.50–1.35)
Potassium: 4.7 mEq/L (ref 3.5–5.1)
Sodium: 139 mEq/L (ref 135–145)

## 2013-07-30 LAB — SURGICAL PCR SCREEN
MRSA, PCR: NEGATIVE
Staphylococcus aureus: POSITIVE — AB

## 2013-07-30 LAB — TYPE AND SCREEN
ABO/RH(D): O POS
Antibody Screen: NEGATIVE

## 2013-07-30 LAB — PROTIME-INR: Prothrombin Time: 13 seconds (ref 11.6–15.2)

## 2013-07-30 LAB — APTT: aPTT: 29 seconds (ref 24–37)

## 2013-07-30 NOTE — Pre-Procedure Instructions (Signed)
Ricardo Eves Sr.  07/30/2013   Your procedure is scheduled on:  November 17  Report to [facility] Entrance "A" [address] at [employer] [facility].  Call this number if you have problems the morning of surgery: [phone]   Remember:   Do not eat food or drink liquids after midnight.   Take these medicines the morning of surgery with A SIP OF WATER: Zoloft   STOP Aspirin, Aleve, Naproxen, Advil, Ibuprofen, Vitamin, Herbs, or Supplements starting Monday   Do not wear jewelry, make-up or nail polish.  Do not wear lotions, powders, or perfumes. You may wear deodorant.  Do not shave 48 hours prior to surgery. Men may shave face and neck.  Do not bring valuables to the hospital.  [facility] is not responsible                  for any belongings or valuables.               Contacts, dentures or bridgework may not be worn into surgery.  Leave suitcase in the car. After surgery it may be brought to your room.  For patients admitted to the hospital, discharge time is determined by your                treatment team.               Special Instructions: Shower using CHG 2 nights before surgery and the night before surgery.  If you shower the day of surgery use CHG.  Use special wash - you have one bottle of CHG for all showers.  You should use approximately 1/3 of the bottle for each shower.   Please read over the following fact sheets that you were given: Pain Booklet, Coughing and Deep Breathing, Blood Transfusion Information and Surgical Site Infection Prevention

## 2013-08-08 MED ORDER — CEFAZOLIN SODIUM-DEXTROSE 2-3 GM-% IV SOLR
2.0000 g | INTRAVENOUS | Status: AC
  Administered 2013-08-09 (×2): 2 g via INTRAVENOUS
  Filled 2013-08-08: qty 50

## 2013-08-09 ENCOUNTER — Encounter (HOSPITAL_COMMUNITY): Payer: Medicare Other | Admitting: Certified Registered"

## 2013-08-09 ENCOUNTER — Inpatient Hospital Stay (HOSPITAL_COMMUNITY): Payer: Medicare Other

## 2013-08-09 ENCOUNTER — Encounter (HOSPITAL_COMMUNITY)
Admission: RE | Disposition: A | Discharge status: Skilled Nursing Facility | Payer: Medicare Other | Source: Ambulatory Visit | Attending: Neurosurgery

## 2013-08-09 ENCOUNTER — Inpatient Hospital Stay (HOSPITAL_COMMUNITY): Payer: Medicare Other | Admitting: Certified Registered"

## 2013-08-09 ENCOUNTER — Encounter (HOSPITAL_COMMUNITY): Payer: Self-pay | Admitting: *Deleted

## 2013-08-09 ENCOUNTER — Inpatient Hospital Stay (HOSPITAL_COMMUNITY)
Admission: RE | Admit: 2013-08-09 | Discharge: 2013-08-18 | DRG: 460 | Disposition: A | Discharge status: Skilled Nursing Facility | Payer: Medicare Other | Source: Ambulatory Visit | Attending: Neurosurgery | Admitting: Neurosurgery

## 2013-08-09 DIAGNOSIS — I2510 Atherosclerotic heart disease of native coronary artery without angina pectoris: Secondary | ICD-10-CM | POA: Diagnosis present

## 2013-08-09 DIAGNOSIS — I252 Old myocardial infarction: Secondary | ICD-10-CM

## 2013-08-09 DIAGNOSIS — Y838 Other surgical procedures as the cause of abnormal reaction of the patient, or of later complication, without mention of misadventure at the time of the procedure: Secondary | ICD-10-CM | POA: Diagnosis present

## 2013-08-09 DIAGNOSIS — Z981 Arthrodesis status: Secondary | ICD-10-CM

## 2013-08-09 DIAGNOSIS — IMO0002 Reserved for concepts with insufficient information to code with codable children: Secondary | ICD-10-CM | POA: Diagnosis present

## 2013-08-09 DIAGNOSIS — M549 Dorsalgia, unspecified: Secondary | ICD-10-CM

## 2013-08-09 DIAGNOSIS — F3289 Other specified depressive episodes: Secondary | ICD-10-CM | POA: Diagnosis present

## 2013-08-09 DIAGNOSIS — Z7982 Long term (current) use of aspirin: Secondary | ICD-10-CM

## 2013-08-09 DIAGNOSIS — Z87891 Personal history of nicotine dependence: Secondary | ICD-10-CM

## 2013-08-09 DIAGNOSIS — M48061 Spinal stenosis, lumbar region without neurogenic claudication: Principal | ICD-10-CM | POA: Diagnosis present

## 2013-08-09 DIAGNOSIS — E785 Hyperlipidemia, unspecified: Secondary | ICD-10-CM | POA: Diagnosis present

## 2013-08-09 DIAGNOSIS — Z9861 Coronary angioplasty status: Secondary | ICD-10-CM

## 2013-08-09 DIAGNOSIS — F329 Major depressive disorder, single episode, unspecified: Secondary | ICD-10-CM | POA: Diagnosis present

## 2013-08-09 DIAGNOSIS — J449 Chronic obstructive pulmonary disease, unspecified: Secondary | ICD-10-CM | POA: Diagnosis present

## 2013-08-09 DIAGNOSIS — J4489 Other specified chronic obstructive pulmonary disease: Secondary | ICD-10-CM | POA: Diagnosis present

## 2013-08-09 DIAGNOSIS — Z8673 Personal history of transient ischemic attack (TIA), and cerebral infarction without residual deficits: Secondary | ICD-10-CM

## 2013-08-09 HISTORY — PX: LUMBAR LAMINECTOMY: SHX95

## 2013-08-09 SURGERY — POSTERIOR LUMBAR FUSION 1 LEVEL
Anesthesia: General | Site: Back | Wound class: Clean

## 2013-08-09 MED ORDER — TRAZODONE HCL 150 MG PO TABS
300.0000 mg | ORAL_TABLET | Freq: Every day | ORAL | Status: DC
  Administered 2013-08-09 – 2013-08-17 (×9): 300 mg via ORAL
  Filled 2013-08-09 (×10): qty 2

## 2013-08-09 MED ORDER — OXYCODONE HCL 5 MG PO TABS
5.0000 mg | ORAL_TABLET | ORAL | Status: DC | PRN
  Administered 2013-08-10: 5 mg via ORAL
  Administered 2013-08-11: 10 mg via ORAL
  Administered 2013-08-11 – 2013-08-12 (×3): 5 mg via ORAL
  Administered 2013-08-13: 10 mg via ORAL
  Administered 2013-08-14 – 2013-08-15 (×2): 5 mg via ORAL
  Administered 2013-08-16 (×3): 10 mg via ORAL
  Administered 2013-08-16: 5 mg via ORAL
  Administered 2013-08-16 – 2013-08-18 (×7): 10 mg via ORAL
  Filled 2013-08-09: qty 1
  Filled 2013-08-09: qty 2
  Filled 2013-08-09: qty 1
  Filled 2013-08-09 (×2): qty 2
  Filled 2013-08-09 (×3): qty 1
  Filled 2013-08-09 (×5): qty 2
  Filled 2013-08-09 (×2): qty 1
  Filled 2013-08-09 (×4): qty 2

## 2013-08-09 MED ORDER — MIDAZOLAM HCL 2 MG/2ML IJ SOLN: 0.5000 mg | Freq: Once | INTRAMUSCULAR | Status: DC | PRN

## 2013-08-09 MED ORDER — PROPOFOL 10 MG/ML IV BOLUS
INTRAVENOUS | Status: DC | PRN
  Administered 2013-08-09: 150 mg via INTRAVENOUS

## 2013-08-09 MED ORDER — ROCURONIUM BROMIDE 100 MG/10ML IV SOLN
INTRAVENOUS | Status: DC | PRN
  Administered 2013-08-09: 50 mg via INTRAVENOUS
  Administered 2013-08-09: 10 mg via INTRAVENOUS

## 2013-08-09 MED ORDER — ZOLPIDEM TARTRATE 5 MG PO TABS: 5.0000 mg | ORAL_TABLET | Freq: Every evening | ORAL | Status: DC | PRN

## 2013-08-09 MED ORDER — PHENYLEPHRINE HCL 10 MG/ML IJ SOLN
10.0000 mg | INTRAMUSCULAR | Status: DC | PRN
  Administered 2013-08-09: 50 ug/min via INTRAVENOUS

## 2013-08-09 MED ORDER — CYCLOBENZAPRINE HCL 10 MG PO TABS
10.0000 mg | ORAL_TABLET | Freq: Three times a day (TID) | ORAL | Status: DC | PRN
  Administered 2013-08-10: 10 mg via ORAL
  Filled 2013-08-09: qty 1

## 2013-08-09 MED ORDER — ONDANSETRON HCL 4 MG/2ML IJ SOLN: 4.0000 mg | INTRAMUSCULAR | Status: DC | PRN

## 2013-08-09 MED ORDER — EZETIMIBE 10 MG PO TABS
10.0000 mg | ORAL_TABLET | Freq: Every day | ORAL | Status: DC
  Administered 2013-08-10 – 2013-08-18 (×9): 10 mg via ORAL
  Filled 2013-08-09 (×9): qty 1

## 2013-08-09 MED ORDER — 0.9 % SODIUM CHLORIDE (POUR BTL) OPTIME
TOPICAL | Status: DC | PRN
  Administered 2013-08-09: 1000 mL

## 2013-08-09 MED ORDER — PROMETHAZINE HCL 25 MG/ML IJ SOLN: 6.2500 mg | INTRAMUSCULAR | Status: DC | PRN

## 2013-08-09 MED ORDER — NITROGLYCERIN 0.4 MG SL SUBL: 0.4000 mg | SUBLINGUAL_TABLET | SUBLINGUAL | Status: DC | PRN

## 2013-08-09 MED ORDER — THROMBIN 20000 UNITS EX SOLR
CUTANEOUS | Status: DC | PRN
  Administered 2013-08-09: 08:00:00 via TOPICAL

## 2013-08-09 MED ORDER — SODIUM CHLORIDE 0.9 % IJ SOLN: 9.0000 mL | INTRAMUSCULAR | Status: DC | PRN

## 2013-08-09 MED ORDER — MORPHINE SULFATE (PF) 1 MG/ML IV SOLN
INTRAVENOUS | Status: DC
  Administered 2013-08-09: 19.5 mg via INTRAVENOUS
  Administered 2013-08-09: 3 mg via INTRAVENOUS
  Administered 2013-08-09 (×2): via INTRAVENOUS
  Administered 2013-08-10: 6.52 mg via INTRAVENOUS
  Administered 2013-08-10: 1.5 mg via INTRAVENOUS
  Filled 2013-08-09: qty 25

## 2013-08-09 MED ORDER — NALOXONE HCL 0.4 MG/ML IJ SOLN: 0.4000 mg | INTRAMUSCULAR | Status: DC | PRN

## 2013-08-09 MED ORDER — ALBUMIN HUMAN 5 % IV SOLN
INTRAVENOUS | Status: DC | PRN
  Administered 2013-08-09: 10:00:00 via INTRAVENOUS

## 2013-08-09 MED ORDER — KETOROLAC TROMETHAMINE 30 MG/ML IJ SOLN
30.0000 mg | Freq: Four times a day (QID) | INTRAMUSCULAR | Status: AC
  Administered 2013-08-09 – 2013-08-10 (×4): 30 mg via INTRAVENOUS
  Filled 2013-08-09 (×4): qty 1

## 2013-08-09 MED ORDER — SERTRALINE HCL 50 MG PO TABS
50.0000 mg | ORAL_TABLET | Freq: Every day | ORAL | Status: DC
  Administered 2013-08-10 – 2013-08-18 (×9): 50 mg via ORAL
  Filled 2013-08-09 (×9): qty 1

## 2013-08-09 MED ORDER — MAGNESIUM HYDROXIDE 400 MG/5ML PO SUSP
30.0000 mL | Freq: Every day | ORAL | Status: DC | PRN
  Administered 2013-08-14: 30 mL via ORAL
  Filled 2013-08-09: qty 30

## 2013-08-09 MED ORDER — CEFAZOLIN SODIUM-DEXTROSE 2-3 GM-% IV SOLR
INTRAVENOUS | Status: AC
  Filled 2013-08-09: qty 50

## 2013-08-09 MED ORDER — DIPHENHYDRAMINE HCL 50 MG/ML IJ SOLN: 12.5000 mg | Freq: Four times a day (QID) | INTRAMUSCULAR | Status: DC | PRN

## 2013-08-09 MED ORDER — INFLUENZA VAC SPLIT QUAD 0.5 ML IM SUSP
0.5000 mL | INTRAMUSCULAR | Status: AC
  Administered 2013-08-17: 0.5 mL via INTRAMUSCULAR
  Filled 2013-08-09 (×2): qty 0.5

## 2013-08-09 MED ORDER — PROMETHAZINE HCL 25 MG PO TABS: 12.5000 mg | ORAL_TABLET | ORAL | Status: DC | PRN

## 2013-08-09 MED ORDER — BISACODYL 10 MG RE SUPP: 10.0000 mg | Freq: Every day | RECTAL | Status: DC | PRN

## 2013-08-09 MED ORDER — METHOCARBAMOL 100 MG/ML IJ SOLN
500.0000 mg | Freq: Four times a day (QID) | INTRAVENOUS | Status: DC | PRN
  Administered 2013-08-09: 500 mg via INTRAVENOUS
  Filled 2013-08-09 (×3): qty 5

## 2013-08-09 MED ORDER — DOCUSATE SODIUM 100 MG PO CAPS
100.0000 mg | ORAL_CAPSULE | Freq: Two times a day (BID) | ORAL | Status: DC
  Administered 2013-08-09 – 2013-08-18 (×15): 100 mg via ORAL
  Filled 2013-08-09 (×14): qty 1

## 2013-08-09 MED ORDER — OXYCODONE HCL 5 MG/5ML PO SOLN: 5.0000 mg | Freq: Once | ORAL | Status: DC | PRN

## 2013-08-09 MED ORDER — LIDOCAINE HCL (CARDIAC) 20 MG/ML IV SOLN
INTRAVENOUS | Status: DC | PRN
  Administered 2013-08-09: 60 mg via INTRAVENOUS

## 2013-08-09 MED ORDER — SODIUM CHLORIDE 0.9 % IJ SOLN
3.0000 mL | Freq: Two times a day (BID) | INTRAMUSCULAR | Status: DC
  Administered 2013-08-09 – 2013-08-10 (×2): 3 mL via INTRAVENOUS

## 2013-08-09 MED ORDER — SODIUM CHLORIDE 0.9 % IR SOLN
Status: DC | PRN
  Administered 2013-08-09: 08:00:00

## 2013-08-09 MED ORDER — MIDAZOLAM HCL 5 MG/5ML IJ SOLN
INTRAMUSCULAR | Status: DC | PRN
  Administered 2013-08-09 (×2): 1 mg via INTRAVENOUS

## 2013-08-09 MED ORDER — DIPHENHYDRAMINE HCL 12.5 MG/5ML PO ELIX: 12.5000 mg | ORAL_SOLUTION | Freq: Four times a day (QID) | ORAL | Status: DC | PRN

## 2013-08-09 MED ORDER — ONDANSETRON HCL 4 MG/2ML IJ SOLN: 4.0000 mg | Freq: Four times a day (QID) | INTRAMUSCULAR | Status: DC | PRN

## 2013-08-09 MED ORDER — PROMETHAZINE HCL 25 MG/ML IJ SOLN: 12.5000 mg | INTRAMUSCULAR | Status: DC | PRN

## 2013-08-09 MED ORDER — OXYCODONE HCL 5 MG PO TABS: 5.0000 mg | ORAL_TABLET | Freq: Once | ORAL | Status: DC | PRN

## 2013-08-09 MED ORDER — ONDANSETRON HCL 4 MG/2ML IJ SOLN
INTRAMUSCULAR | Status: DC | PRN
  Administered 2013-08-09: 4 mg via INTRAVENOUS

## 2013-08-09 MED ORDER — TRAMADOL HCL 50 MG PO TABS
50.0000 mg | ORAL_TABLET | Freq: Four times a day (QID) | ORAL | Status: DC | PRN
  Administered 2013-08-13 – 2013-08-18 (×5): 50 mg via ORAL
  Filled 2013-08-09 (×6): qty 1

## 2013-08-09 MED ORDER — LIDOCAINE-EPINEPHRINE 1 %-1:100000 IJ SOLN
INTRAMUSCULAR | Status: DC | PRN
  Administered 2013-08-09: 20 mL

## 2013-08-09 MED ORDER — MEPERIDINE HCL 25 MG/ML IJ SOLN: 6.2500 mg | INTRAMUSCULAR | Status: DC | PRN

## 2013-08-09 MED ORDER — CEFAZOLIN SODIUM 1-5 GM-% IV SOLN
1.0000 g | Freq: Three times a day (TID) | INTRAVENOUS | Status: AC
  Administered 2013-08-09 – 2013-08-10 (×2): 1 g via INTRAVENOUS
  Filled 2013-08-09 (×2): qty 50

## 2013-08-09 MED ORDER — SODIUM CHLORIDE 0.9 % IV SOLN: 250.0000 mL | INTRAVENOUS | Status: DC

## 2013-08-09 MED ORDER — LACTATED RINGERS IV SOLN
INTRAVENOUS | Status: DC | PRN
  Administered 2013-08-09 (×3): via INTRAVENOUS

## 2013-08-09 MED ORDER — FENTANYL CITRATE 0.05 MG/ML IJ SOLN
INTRAMUSCULAR | Status: DC | PRN
  Administered 2013-08-09: 100 ug via INTRAVENOUS
  Administered 2013-08-09: 25 ug via INTRAVENOUS
  Administered 2013-08-09: 100 ug via INTRAVENOUS
  Administered 2013-08-09 (×2): 50 ug via INTRAVENOUS
  Administered 2013-08-09: 25 ug via INTRAVENOUS
  Administered 2013-08-09 (×2): 50 ug via INTRAVENOUS

## 2013-08-09 MED ORDER — SODIUM CHLORIDE 0.9 % IJ SOLN: 3.0000 mL | INTRAMUSCULAR | Status: DC | PRN

## 2013-08-09 MED ORDER — MORPHINE SULFATE (PF) 1 MG/ML IV SOLN
INTRAVENOUS | Status: AC
  Filled 2013-08-09: qty 25

## 2013-08-09 MED ORDER — HYDROMORPHONE HCL PF 1 MG/ML IJ SOLN: 0.2500 mg | INTRAMUSCULAR | Status: DC | PRN

## 2013-08-09 MED ORDER — NEOSTIGMINE METHYLSULFATE 1 MG/ML IJ SOLN
INTRAMUSCULAR | Status: DC | PRN
  Administered 2013-08-09: 4 mg via INTRAVENOUS

## 2013-08-09 MED ORDER — LACTATED RINGERS IV SOLN
INTRAVENOUS | Status: DC
  Administered 2013-08-09: 1000 mL via INTRAVENOUS
  Administered 2013-08-10: 11:00:00 via INTRAVENOUS

## 2013-08-09 MED ORDER — METHOCARBAMOL 500 MG PO TABS
500.0000 mg | ORAL_TABLET | Freq: Four times a day (QID) | ORAL | Status: DC | PRN
  Filled 2013-08-09: qty 1

## 2013-08-09 MED ORDER — NITROGLYCERIN 0.4 MG SL SUBL: 0.4000 mg | SUBLINGUAL_TABLET | SUBLINGUAL | Status: DC

## 2013-08-09 MED ORDER — GLYCOPYRROLATE 0.2 MG/ML IJ SOLN
INTRAMUSCULAR | Status: DC | PRN
  Administered 2013-08-09: .8 mg via INTRAVENOUS

## 2013-08-09 SURGICAL SUPPLY — 70 items
BAG DECANTER FOR FLEXI CONT (MISCELLANEOUS) ×2 IMPLANT
BENZOIN TINCTURE PRP APPL 2/3 (GAUZE/BANDAGES/DRESSINGS) ×2 IMPLANT
BLADE SURG ROTATE 9660 (MISCELLANEOUS) IMPLANT
BUR PRECISION FLUTE 5.0 (BURR) ×2 IMPLANT
BUR ROUND FLUTED 5 RND (BURR) ×2 IMPLANT
CAGE CONCORDE BULLET 9X9X23 (Cage) ×2 IMPLANT
CAGE SPNL PRLL BLT NOSE 23X9X9 (Cage) ×2 IMPLANT
CANISTER SUCT 3000ML (MISCELLANEOUS) ×2 IMPLANT
CONNECTOR EXPEDIUM SFX SZ A5 (Orthopedic Implant) ×2 IMPLANT
CONT SPEC 4OZ CLIKSEAL STRL BL (MISCELLANEOUS) ×4 IMPLANT
COVER BACK TABLE 24X17X13 BIG (DRAPES) IMPLANT
COVER TABLE BACK 60X90 (DRAPES) ×2 IMPLANT
DECANTER SPIKE VIAL GLASS SM (MISCELLANEOUS) ×2 IMPLANT
DRAPE C-ARM 42X72 X-RAY (DRAPES) ×4 IMPLANT
DRAPE LAPAROTOMY 100X72X124 (DRAPES) ×2 IMPLANT
DRAPE POUCH INSTRU U-SHP 10X18 (DRAPES) ×2 IMPLANT
DRAPE PROXIMA HALF (DRAPES) ×2 IMPLANT
DRESSING TELFA 8X3 (GAUZE/BANDAGES/DRESSINGS) ×2 IMPLANT
DURAPREP 26ML APPLICATOR (WOUND CARE) ×2 IMPLANT
ELECT REM PT RETURN 9FT ADLT (ELECTROSURGICAL) ×2
ELECTRODE REM PT RTRN 9FT ADLT (ELECTROSURGICAL) ×1 IMPLANT
GAUZE SPONGE 4X4 16PLY XRAY LF (GAUZE/BANDAGES/DRESSINGS) IMPLANT
GLOVE BIO SURGEON STRL SZ 6.5 (GLOVE) ×4 IMPLANT
GLOVE BIOGEL PI IND STRL 6.5 (GLOVE) ×1 IMPLANT
GLOVE BIOGEL PI IND STRL 7.0 (GLOVE) ×1 IMPLANT
GLOVE BIOGEL PI INDICATOR 6.5 (GLOVE) ×1
GLOVE BIOGEL PI INDICATOR 7.0 (GLOVE) ×1
GLOVE ECLIPSE 7.5 STRL STRAW (GLOVE) ×6 IMPLANT
GLOVE EXAM NITRILE LRG STRL (GLOVE) IMPLANT
GLOVE EXAM NITRILE MD LF STRL (GLOVE) IMPLANT
GLOVE EXAM NITRILE XL STR (GLOVE) IMPLANT
GLOVE EXAM NITRILE XS STR PU (GLOVE) IMPLANT
GLOVE SS BIOGEL STRL SZ 6.5 (GLOVE) ×3 IMPLANT
GLOVE SUPERSENSE BIOGEL SZ 6.5 (GLOVE) ×3
GOWN BRE IMP SLV AUR LG STRL (GOWN DISPOSABLE) ×8 IMPLANT
GOWN BRE IMP SLV AUR XL STRL (GOWN DISPOSABLE) IMPLANT
GOWN STRL REIN 2XL LVL4 (GOWN DISPOSABLE) ×2 IMPLANT
INSYTE AUTOGUARD 14GA ×2 IMPLANT
KIT BASIN OR (CUSTOM PROCEDURE TRAY) ×2 IMPLANT
KIT INFUSE X SMALL 1.4CC (Orthopedic Implant) ×2 IMPLANT
KIT ROOM TURNOVER OR (KITS) ×2 IMPLANT
NEEDLE HYPO 22GX1.5 SAFETY (NEEDLE) ×2 IMPLANT
NS IRRIG 1000ML POUR BTL (IV SOLUTION) ×2 IMPLANT
PACK LAMINECTOMY NEURO (CUSTOM PROCEDURE TRAY) ×2 IMPLANT
PAD ARMBOARD 7.5X6 YLW CONV (MISCELLANEOUS) ×6 IMPLANT
PATTIES SURGICAL .75X.75 (GAUZE/BANDAGES/DRESSINGS) ×2 IMPLANT
PUTTY BONE DBX 2.5 MIS (Bone Implant) ×2 IMPLANT
ROD EXPEDIUM PRE BENT 5.5X75 (Rod) ×2 IMPLANT
ROD EXPEDIUM PREBENT 85MM (Rod) ×2 IMPLANT
SCREW EXPEDIUM POLYAXIAL 6X50M (Screw) ×4 IMPLANT
SCREW POLY EXP 7X50MM (Screw) ×4 IMPLANT
SCREW POLY EXPENIUM 9X50MM (Screw) ×4 IMPLANT
SCREW SET SINGLE INNER (Screw) ×10 IMPLANT
SHEET CONFORM 45LX20WX7H (Bone Implant) ×4 IMPLANT
SPONGE GAUZE 4X4 12PLY (GAUZE/BANDAGES/DRESSINGS) ×2 IMPLANT
SPONGE LAP 4X18 X RAY DECT (DISPOSABLE) IMPLANT
SPONGE SURGIFOAM ABS GEL 100 (HEMOSTASIS) ×2 IMPLANT
STRIP CLOSURE SKIN 1/2X4 (GAUZE/BANDAGES/DRESSINGS) ×4 IMPLANT
SUT VIC AB 0 CT1 18XCR BRD8 (SUTURE) ×2 IMPLANT
SUT VIC AB 0 CT1 8-18 (SUTURE) ×2
SUT VIC AB 2-0 CP2 18 (SUTURE) ×4 IMPLANT
SUT VIC AB 3-0 SH 8-18 (SUTURE) ×2 IMPLANT
SYR 20ML ECCENTRIC (SYRINGE) ×2 IMPLANT
SYR CONTROL 10ML LL (SYRINGE) IMPLANT
TAPE CLOTH SURG 4X10 WHT LF (GAUZE/BANDAGES/DRESSINGS) ×2 IMPLANT
TOWEL OR 17X24 6PK STRL BLUE (TOWEL DISPOSABLE) ×2 IMPLANT
TOWEL OR 17X26 10 PK STRL BLUE (TOWEL DISPOSABLE) ×2 IMPLANT
TRAP SPECIMEN MUCOUS 40CC (MISCELLANEOUS) ×2 IMPLANT
TRAY FOLEY CATH 16FRSI W/METER (SET/KITS/TRAYS/PACK) ×2 IMPLANT
WATER STERILE IRR 1000ML POUR (IV SOLUTION) ×2 IMPLANT

## 2013-08-09 NOTE — Anesthesia Procedure Notes (Signed)
Procedure Name: Intubation Date/Time: 08/09/2013 7:59 AM Performed by: Armandina Gemma Pre-anesthesia Checklist: Patient identified, Timeout performed, Emergency Drugs available, Suction available and Patient being monitored Patient Re-evaluated:Patient Re-evaluated prior to inductionOxygen Delivery Method: Circle system utilized Preoxygenation: Pre-oxygenation with 100% oxygen Intubation Type: IV induction Ventilation: Mask ventilation without difficulty, Oral airway inserted - appropriate to patient size and Two handed mask ventilation required Laryngoscope Size: Miller and 2 Grade View: Grade I Tube type: Oral Tube size: 7.5 mm Number of attempts: 1 Airway Equipment and Method: Stylet Placement Confirmation: ETT inserted through vocal cords under direct vision,  breath sounds checked- equal and bilateral and positive ETCO2 Secured at: 23 cm Tube secured with: Tape Dental Injury: Teeth and Oropharynx as per pre-operative assessment  Comments: IV induction Jackson- intubation AM CRNA- atraumatic - mouth and lips as preop

## 2013-08-09 NOTE — Progress Notes (Signed)
Pt foley discontinued 1500. Pt unable to void. Bladder scan done and it showed more than 519 ml. In and out cath done as ordered and was able to empty 900 ml of clear yellow colored urine. Procedure done using sterile technique. Will continue to monitor pt.

## 2013-08-09 NOTE — Preoperative (Signed)
Beta Blockers   Reason not to administer Beta Blockers:Not Applicable 

## 2013-08-09 NOTE — Evaluation (Signed)
Occupational Therapy Evaluation Patient Details Name: Ricardo SHEAFFER Sr. MRN: [id number] DOB: January 06, 1946 Today's Date: 08/09/2013 Time: 4034-7425 OT Time Calculation (min): 23 min  OT Assessment / Plan / Recommendation History of present illness 67 y.o. s/p  Redo decompressive laminectomy decompressing the L1 and L2 roots  (2 levels) ,   Clinical Impression   Pt presents with below problem list. Pt independent with ADLs, PTA. Pt will benefit from acute OT to increase independence prior to d/c.     OT Assessment  Patient needs continued OT Services    Follow Up Recommendations  Home health OT;Supervision/Assistance - 24 hour    Barriers to Discharge      Equipment Recommendations  3 in 1 bedside comode;Other (comment) (tub/shower equipment tbd)    Recommendations for Other Services    Frequency  Min 2X/week    Precautions / Restrictions Precautions Precautions: Fall;Back Precaution Booklet Issued: No Precaution Comments: Reviewed precautions with pt. Required Braces or Orthoses: Spinal Brace Spinal Brace: Thoracolumbosacral orthotic;Applied in sitting position   Pertinent Vitals/Pain Pain 7/10. Repositioned. O2 sats in high 70's-80's but trended up. Nurse notified.     ADL  Eating/Feeding: Independent Where Assessed - Eating/Feeding: Chair Grooming: Set up;Supervision/safety Where Assessed - Grooming: Supported sitting Upper Body Dressing: +1 Total assistance (back brace) Where Assessed - Upper Body Dressing: Unsupported sitting Lower Body Dressing: Moderate assistance Where Assessed - Lower Body Dressing: Supported sit to stand Toilet Transfer: Min guard;Minimal assistance;Moderate assistance Toilet Transfer Method: Sit to stand;Other (comment) (Min A/Mod A for stand to sit; Min guard-sit to stand) Acupuncturist: Other (comment) (sit <> stand from bed/recliner chair) Tub/Shower Transfer Method: Not assessed Equipment Used: Back brace;Gait belt;Rolling  walker Transfers/Ambulation Related to ADLs: Min guard for ambulation and sit to stand transfers. Min A/Mod A for stand to sit to recliner chair. ADL Comments: Pt dizzy during session. Pt donned pants with Min A, but unable to don/doff socks while maintaining precautions. Educated to stand in front of bed/chair with walker in front when pulling up LB clothing. Pt states he will have someone help him with bathing and dressing because OT explained there is AE available for LB ADLs. OT educated that long handled sponge for bathing may be beneficial and recommended sitting to bathe. OT donned back brace and educated to just undo one side. Educated on toilet aid for hygiene if he has difficulty with this. Educated on setting grooming items on right side of sink and using cups for teeth care to avoid breaking precautions.     OT Diagnosis: Acute pain  OT Problem List: Decreased activity tolerance;Impaired balance (sitting and/or standing);Decreased knowledge of use of DME or AE;Decreased knowledge of precautions;Pain OT Treatment Interventions: Self-care/ADL training;DME and/or AE instruction;Therapeutic activities;Patient/family education;Balance training   OT Goals(Current goals can be found in the care plan section) Acute Rehab OT Goals Patient Stated Goal: not stated OT Goal Formulation: With patient Time For Goal Achievement: 08/16/13 Potential to Achieve Goals: Good ADL Goals Pt Will Perform Grooming: with modified independence;standing Pt Will Transfer to Toilet: with modified independence;ambulating (3 in 1 over commode) Pt Will Perform Toileting - Clothing Manipulation and hygiene: with modified independence;sit to/from stand Pt Will Perform Tub/Shower Transfer: Tub transfer;with supervision;ambulating;rolling walker (tub equipment tbd) Additional ADL Goal #1: Pt will independently verbalize 3/3 back precautions.  Additional ADL Goal #2: Caregiver will be independent in donning/doffing back  brace.  Additional ADL Goal #3: Pt will perform bed mobility at Mod I level as precursor for  ADLs.   Visit Information  Last OT Received On: 08/09/13 Assistance Needed: +1 History of Present Illness: 67 y.o. s/p  Redo decompressive laminectomy decompressing the L1 and L2 roots  (2 levels) ,       Prior Functioning     Home Living Family/patient expects to be discharged to:: Private residence Living Arrangements: Other (Comment) (sister in law) Available Help at Discharge: Family;Friend(s);Available 24 hours/day Type of Home: Mobile home Home Access: Stairs to enter Entrance Stairs-Number of Steps: 3 Entrance Stairs-Rails:  (rails on both sides) Home Layout: One level Home Equipment: Other (comment);Cane - single point (sister in law has walker with 4 wheels) Prior Function Level of Independence: Independent with assistive device(s) Comments: used cane sometimes Communication Communication: Other (comment) (slurred speech) Dominant Hand: Right         Vision/Perception     Cognition  Cognition Arousal/Alertness: Awake/alert Behavior During Therapy: WFL for tasks assessed/performed Overall Cognitive Status: Within Functional Limits for tasks assessed    Extremity/Trunk Assessment Upper Extremity Assessment Upper Extremity Assessment: Overall WFL for tasks assessed Lower Extremity Assessment Lower Extremity Assessment: Defer to PT evaluation     Mobility Bed Mobility Bed Mobility: Rolling Left;Left Sidelying to Sit Rolling Left: 4: Min guard;With rail Left Sidelying to Sit: 4: Min assist;HOB flat Details for Bed Mobility Assistance: Cues for technique. Assisted with trunk. Transfers Transfers: Sit to Stand;Stand to Sit Sit to Stand: 4: Min guard;With upper extremity assist;From bed;From chair/3-in-1 Stand to Sit: 4: Min assist;3: Mod assist;To chair/3-in-1 Details for Transfer Assistance: Assist to control descent to chair. Cues for hand placement.      Exercise     Balance     End of Session OT - End of Session Equipment Utilized During Treatment: Gait belt;Rolling walker;Back brace Activity Tolerance: Patient tolerated treatment well Patient left: in chair;with call bell/phone within reach Nurse Communication: Mobility status  GO     Earlie Raveling OTR/L [phone] 08/09/2013, 5:38 PM

## 2013-08-09 NOTE — Interval H&P Note (Signed)
History and Physical Interval Note:  08/09/2013 7:45 AM  [name] Sr.  has presented today for surgery, with the diagnosis of Lumbar stenosis, Lumbar spondylosis  The various methods of treatment have been discussed with the patient and family. After consideration of risks, benefits and other options for treatment, the patient has consented to  Procedure(s) with comments: POSTERIOR LUMBAR FUSION 1 LEVEL (N/A) - L1-2 Posterior lumbar interbody fusion/sabre cages/expedium screws as a surgical intervention .  The patient's history has been reviewed, patient examined, no change in status, stable for surgery.  I have reviewed the patient's chart and labs.  Questions were answered to the patient's satisfaction.     [name]

## 2013-08-09 NOTE — Anesthesia Preprocedure Evaluation (Addendum)
Anesthesia Evaluation  Patient identified by MRN, date of birth, ID band Patient awake    Reviewed: Allergy & Precautions, H&P , NPO status , Patient's Chart, lab work & pertinent test results  History of Anesthesia Complications Negative for: history of anesthetic complications  Airway Mallampati: II TM Distance: >3 FB Neck ROM: Full    Dental  (+) Edentulous Upper, Edentulous Lower and Dental Advisory Given   Pulmonary COPDformer smoker,    Pulmonary exam normal       Cardiovascular - angina+ CAD (3v ASCADz), + Past MI ('12) and + Cardiac Stents ('06) Rhythm:Regular Rate:Normal  10/14 stress: LV Ejection Fraction: 45%.  LV Wall Motion:  There is some hypokinesis of the inferior wall.    Neuro/Psych Anxiety Depression CVA (L foot, B grip weakness), Residual Symptoms    GI/Hepatic negative GI ROS, Neg liver ROS,   Endo/Other  negative endocrine ROS  Renal/GU negative Renal ROS     Musculoskeletal   Abdominal   Peds  Hematology   Anesthesia Other Findings   Reproductive/Obstetrics                         Anesthesia Physical Anesthesia Plan  ASA: III  Anesthesia Plan: General   Post-op Pain Management:    Induction: Intravenous  Airway Management Planned: Oral ETT  Additional Equipment: Arterial line  Intra-op Plan:   Post-operative Plan:   Informed Consent: I have reviewed the patients History and Physical, chart, labs and discussed the procedure including the risks, benefits and alternatives for the proposed anesthesia with the patient or authorized representative who has indicated his/her understanding and acceptance.     Plan Discussed with: CRNA and Surgeon  Anesthesia Plan Comments: (Plan routine monitors, A line, GETA)        Anesthesia Quick Evaluation

## 2013-08-09 NOTE — Anesthesia Postprocedure Evaluation (Signed)
  Anesthesia Post-op Note  Patient: Ricardo SWOBODA Sr.  Procedure(s) Performed: Procedure(s) with comments: Lumbar one-two Posterior lumbar interbody fusion/sabre cages/expedium screws (N/A) - Lumbar one-two Posterior lumbar interbody fusion/sabre cages/expedium screws  Patient Location: PACU  Anesthesia Type:General  Level of Consciousness: awake, alert , oriented and patient cooperative  Airway and Oxygen Therapy: Patient Spontanous Breathing and Patient connected to nasal cannula oxygen  Post-op Pain: mild  Post-op Assessment: Post-op Vital signs reviewed, Patient's Cardiovascular Status Stable, Respiratory Function Stable, Patent Airway, No signs of Nausea or vomiting and Pain level controlled  Post-op Vital Signs: Reviewed and stable  Complications: No apparent anesthesia complications

## 2013-08-09 NOTE — Transfer of Care (Signed)
Immediate Anesthesia Transfer of Care Note  Patient: Ricardo MATTON Sr.  Procedure(s) Performed: Procedure(s) with comments: Lumbar one-two Posterior lumbar interbody fusion/sabre cages/expedium screws (N/A) - Lumbar one-two Posterior lumbar interbody fusion/sabre cages/expedium screws  Patient Location: PACU  Anesthesia Type:General  Level of Consciousness: sedated  Airway & Oxygen Therapy: Patient Spontanous Breathing and Patient connected to nasal cannula oxygen  Post-op Assessment: Report given to PACU RN and Post -op Vital signs reviewed and stable  Post vital signs: Reviewed and stable  Complications: No apparent anesthesia complications

## 2013-08-09 NOTE — H&P (Signed)
Subjective: Pt with LBP and leg pain. Trouble walking.  Has stenosis at L1-2  - the level above prior fusion  Patient Active Problem List   Diagnosis Date Noted  . Post-operative state 03/29/2013  . Chest wall mass 01/27/2013  . Pulmonary nodule 04/06/2012  . Chest pain 03/23/2012  . Noncompliance with medication regimen 03/23/2012  . Weight loss 01/06/2012  . L leg Claudication? 01/06/2012  . Prediabetes 01/06/2012  . Medicare annual wellness visit, subsequent 01/04/2011  . BACK PAIN 05/07/2010  . HYPERLIPIDEMIA 12/26/2009  . TOBACCO ABUSE 12/05/2009  . DEPRESSION 12/05/2009  . CORONARY ARTERY DISEASE 12/05/2009  . COPD 12/05/2009  . CEREBROVASCULAR ACCIDENT, HX OF 12/05/2009   Past Medical History  Diagnosis Date  . Depression   . COPD, mild      PFTs 12/2008  . Hyperlipidemia   . Colon polyp     three 2-51mm polyps in descending colon, medium sized lipoma in sigmoid colon,  multiple in recto-sigmoid colon  . NSTEMI (non-ST elevated myocardial infarction) 02-2011  . Coronary atherosclerosis of native coronary artery     Multivessel, stent LAD 1998, thrombectomy 2006, PTCA 2011, NSTEMI 6/12 managed medically;  LexiScan Myoview (10/14):  Difficult study, inferior scar with peri-infarct ischemia, inferior HK, EF 45%; low risk  . Full dentures   . Wears glasses   . 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 ZOXWRUEA(540.9)     Past Surgical History  Procedure Laterality Date  . Appendectomy    . Tonsillectomy    . Lumbar fusion      x5 Dr. Samara Deist  . 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 Coronary stent placement  1998    placed in the LAD  . Thrombectomy  02/2005    acute non ST segment elevation myocardial infarction  . Coronary stent placement  02/2005    PCI/drug-eluting stent implantation mid-LAD  . Balloon angioplasty, artery  04/2010    sp PTCA  . Nasal fracture  surgery      x2  . Inguinal hernia repair      x3  . Back surgery  1986  . Mass excision Left 03/09/2013    Procedure: EXCISION left chest wall MASS;  Surgeon: Maisie Fus A. Cornett, MD;  Location: [facility];  Service: General;  Laterality: Left;    Prescriptions prior to admission  Medication Sig Dispense Refill  . aspirin EC 325 MG tablet Take 1 tablet (325 mg total) by mouth daily.      Marland Kitchen ezetimibe (ZETIA) 10 MG tablet Take 1 tablet (10 mg total) by mouth daily.  90 tablet  1  . sertraline (ZOLOFT) 50 MG tablet Take 1 tablet (50 mg total) by mouth daily.  30 tablet  1  . traZODone (DESYREL) 150 MG tablet Take 2 tablets (300 mg total) by mouth at bedtime. 2 by mouth at bedtime  180 tablet  1  . nitroGLYCERIN (NITROSTAT) 0.4 MG SL tablet Place 1 tablet (0.4 mg total) under the tongue as directed. 1 tablet under tongue at onset of chest pain; you may repeat every  Minutes for up to 3 doses  25 tablet  3   Allergies  Allergen Reactions  . Acetaminophen     REACTION: tongue swells  . Codeine     REACTION: Makes "him mean"  . Hydrocodone-Acetaminophen     REACTION:  nausea, vomiting "makes me deathly sick"  . Tetracycline     REACTION: loss of balance  . Vicodin [Hydrocodone-Acetaminophen]     History  Substance Use Topics  . Smoking status: Former Smoker -- 1.00 packs/day for 25 years    Types: Cigarettes    Quit date: 05/07/2013  . Smokeless tobacco: Never Used  . Alcohol Use: Yes     Comment: Rarely     Family History  Problem Relation Age of Onset  . Heart attack Mother   . Lung cancer Father     +smoker  . Diabetes      GM, aunt, other memebers  . Prostate cancer Neg Hx        . Colon cancer Neg Hx     Review of Systems A comprehensive review of systems was negative.  Objective: Vital signs in last 24 hours: Temp:  [97.5 F (36.4 C)] 97.5 F (36.4 C) (11/17 0622) Pulse Rate:  [61] 61 (11/17 0622) Resp:  [18] 18 (11/17 0622) BP: (128)/(82) 128/82  mmHg (11/17 0622) SpO2:  [97 %] 97 % (11/17 0622)  diminished DRT  - + SLR on right  - motor/sensory intact  Data Review CBC:  Lab Results  Component Value Date   WBC 5.7 07/30/2013   RBC 5.09 07/30/2013    Assessment/Plan: Pt with stenosis L1-2  - the l;evel above prior fusion  - willprocede with redo decompression and fusion with instumentation   Jeaninne Lodico R, MD 08/09/2013 7:42 AM

## 2013-08-09 NOTE — Progress Notes (Signed)
Orthopedic Tech Progress Note Patient Details:  Ricardo CRESCENZO Sr. October 15, 1945 [id number]  Patient ID: [name], male   DOB: 08-27-1946, 67 y.o.   MRN: [id number] Brace order completed by Storm Frisk, Ho Parisi 08/09/2013, 4:43 PM

## 2013-08-09 NOTE — Op Note (Signed)
08/09/2013  12:19 PM  PATIENT:  Ricardo Eves Sr.  67 y.o. 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  PROCEDURE:  Procedure(s):  Redo decompressive laminectomy decompressing the L1 and L2 roots  (2 levels) ,  PLIF L1-2 ,  interbody cages L1-2 ,   Removal of prior instrumentation L2-5,  Exploration fusion L2-3,    posteriorlateral fusion L2-3 ,   Segmented pedicle screw  fixation L1-3 with expedium screws  autograft  ,   Allograft, BMP, bone marrow aspirate  SURGEON:  Surgeon(s): Clydene Fake, MD Barnett Abu, MD-assist    ANESTHESIA:   general  EBL:  Total I/O In: 2250 [I.V.:2000; IV Piggyback:250] Out: 500 [Urine:200; Blood:300]  BLOOD ADMINISTERED:none  DRAINS: none   SPECIMEN:  No Specimen  DICTATION: Patient with prior decompression fusion L2-5 who is lately has been progressive back and leg pain worse the left with trouble walking is not improved with nonsteroidal anti-inflammatories nor other conservative management he has been quite smoking over 4 months and workup with x-rays and MRI was done showing severe stenosis at L1-2 level which is above his prior fusion and possible nonunion at L2-3.  Patient brought in for decompression and fusion lumbar spine.  Patient brought into operating room general anesthesia induced patient placed in prone position Wilson frame all pressure points padded.  Patient prepped draped sterile fashion and sent incision inject with 20 cc 1% lidocaine with epinephrine. Incision was made centered previous scar and extended cephalad incision taken the fascia hemostasis obtained with Bovie cauterization on the top a semi-spinous process which could be at L2 and prepped suppressed dissection over the L1 to spinous process lamina to the facet. We found the top pedicle screw at L2 and then the exposed the pedicle screws and  rods they was present from L2-5 bilaterally. We exposed the transverse processes of L1-2 and 3 bilaterally we then removed the rod and locking nuts remove the rod and then removed the screws at L4 and L5 bilaterally the L2 screws then removed a were very loose and pulled it without any effort. Explored the fusion is clear movement at the to 3 level showing nonunion at the to 3 level. The S3 screws were then removed and then we checked the hole with a small ball probe had good bony circumference we tapped the hole with expedient tap and placed a slightly larger 7 mm pedicle screw at the L3 pedicles bilaterally.  And marker at the L1-L2 which points in a TL 1 to space took an x-ray and this did confirm or positioning and then a decompressive laminectomy administered Leksell rongeurs Kerrison punches high-speed drill decompression the central canal minimal and lateral decompression to the full facetectomy bilaterally to get a good did decompression the central canal and decompress the L1 and L2 the nerve roots this decompression was much wider than was needed for posterior lumbar interbody fusion. But we did decompression the nerve root we explored the epidural space the disc space and hemostasis bipolar cauterization incised the space did discectomy we distracted the interspace up to 9 mm bilaterally prepare the interbody space for interbody fusion with curettes broaches scrapers. We then packed autograft bone all the bone removed during the laminectomy was cleaned from soft tissue chart the small pieces allograft putty and this and this mixture was packed in the interbody space and then to Number high  cages were packed with this autograft allograft mixture in the cages were tapped in place at the L1-2 level. We explored the dura nerve root could be did decompression. High-speed drill was used to decorticate transverse process of L1-2 and 3 and lateral facets bilaterally. We then using fluoroscopy from the pedicle  point for L1 decorticated a high-speed drill placed a probe down the pedicle aspirated bone marrow aspirate for use of the allograft sponge checked for the on the pedicle entry he did bony circumference tapped hole and placed Expedium pedicle screw is bilaterally at L1. We then explored the holes we had at L2 they were very widened because the nonunion of that was there on the right side we could tapped the hole 9 mm tap and placed a 9 mm pedicle screw in the left-sided holes to the 9 screws 2 small we did not place a screw into the left L2 hole. We placed rods and screws placed locking caps and final tightened as placed a cross connector at the lower end of the construct and final tightened as screws. BMP was then placed in the posterolateral space and L2-3 the level of the nonunion rest the autograft bone was then packed the posteriorfrom L1 and L3 and the allograft sponge is with the bone marrow aspirate was also packed the posteriorroom in detail to for a posterior fusion specifically at the L2-3 level the area nonunion. Place the rod. To final AP and lateral for imaging showed good position of her pedicle screws and interbody cages. Retractors were removed with very good hemostasis we explored the dura and nerve root redo decompression of the L1 and L2 roots along the central canal and placing Gelfoam over the lateral gutters and the fascia closed with 0 Vicryl interrupted sutures subcutaneous incision closed with 021 through Vicryl interrupted sutures skin closed benzoin Steri-Strips dressing was placed patient placed back in position woken (and transferred to recovery.    PLAN OF CARE: Admit to inpatient   PATIENT DISPOSITION:  PACU - hemodynamically stable.

## 2013-08-10 LAB — GLUCOSE, CAPILLARY: Glucose-Capillary: 133 mg/dL — ABNORMAL HIGH (ref 70–99)

## 2013-08-10 MED ORDER — TAMSULOSIN HCL 0.4 MG PO CAPS
0.4000 mg | ORAL_CAPSULE | Freq: Every day | ORAL | Status: DC
  Administered 2013-08-10 – 2013-08-18 (×9): 0.4 mg via ORAL
  Filled 2013-08-10 (×10): qty 1

## 2013-08-10 MED ORDER — MORPHINE SULFATE 2 MG/ML IJ SOLN
2.0000 mg | INTRAMUSCULAR | Status: DC | PRN
  Administered 2013-08-10: 4 mg via INTRAVENOUS
  Filled 2013-08-10: qty 2

## 2013-08-10 NOTE — Progress Notes (Signed)
Doing well. C/o appropriate incisional soreness. Still with some leg pain No Numbness, tingling, weakness No Nausea /vomiting Amb already  - unable to void  Temp:  [97.2 F (36.2 C)-100.6 F (38.1 C)] 98.8 F (37.1 C) (11/18 0909) Pulse Rate:  [58-83] 72 (11/18 0909) Resp:  [8-20] 12 (11/18 0909) BP: (96-125)/(42-70) 96/55 mmHg (11/18 0909) SpO2:  [91 %-97 %] 93 % (11/18 0909) Arterial Line BP: (101-130)/(51-61) 111/51 mmHg (11/17 1415) Weight:  [90.5 kg (199 lb 8.3 oz)] 90.5 kg (199 lb 8.3 oz) (11/17 1129) Good strength and sensation Incision CDI  Plan: Increase activity

## 2013-08-10 NOTE — Evaluation (Signed)
Physical Therapy Evaluation Patient Details Name: Ricardo NICOTRA Sr. MRN: [id number] DOB: [birth year] Today's Date: 08/10/2013 Time: 4098-1191 PT Time Calculation (min): 28 min  PT Assessment / Plan / Recommendation History of Present Illness  67 y.o. s/p  Redo decompressive laminectomy decompressing the L1 and L2 roots  (2 levels) ,  Clinical Impression  Pt seems more alert than for OT this am, but still with decreased mobility and safety.  Pt's dressing and bed wet with sanguinous drainage.  Notified Nsg station x2 and Nsg Tech.  PT reinforced pt's dressing with ABD.  At this time pt would benefit from HHPT and 24hr A at D/C.  Will continue to follow.       PT Assessment  Patient needs continued PT services    Follow Up Recommendations  Home health PT;Supervision/Assistance - 24 hour    Does the patient have the potential to tolerate intense rehabilitation      Barriers to Discharge        Equipment Recommendations  Rolling walker with 5" wheels    Recommendations for Other Services     Frequency Min 5X/week    Precautions / Restrictions Precautions Precautions: Fall;Back Precaution Booklet Issued: No Precaution Comments: Reviewed precautions with pt. Required Braces or Orthoses: Spinal Brace Spinal Brace: Thoracolumbosacral orthotic;Applied in sitting position Restrictions Weight Bearing Restrictions: No   Pertinent Vitals/Pain Indicates low back pain is "better than it was".        Mobility  Bed Mobility Bed Mobility: Rolling Left;Left Sidelying to Sit;Sitting - Scoot to Edge of Bed Rolling Right: 4: Min assist Rolling Left: 5: Supervision;With rail Left Sidelying to Sit: 4: Min guard;With rails;HOB elevated Sitting - Scoot to Edge of Bed: 4: Min guard Sit to Sidelying Left: 4: Min guard Details for Bed Mobility Assistance: cues for log roll and safety.   Transfers Transfers: Sit to Stand;Stand to Sit Sit to Stand: 4: Min assist;With upper extremity  assist;From bed Stand to Sit: 4: Min assist;With upper extremity assist;To chair/3-in-1 Details for Transfer Assistance: cues for safe technique and safety with mobility.   Ambulation/Gait Ambulation/Gait Assistance: 4: Min assist Ambulation Distance (Feet): 5 Feet Assistive device: Rolling walker Ambulation/Gait Assistance Details: cues for use of RW, upright posture, positioning within RW.   Gait Pattern: Step-through pattern;Decreased stride length;Trunk flexed Stairs: No Wheelchair Mobility Wheelchair Mobility: No    Exercises     PT Diagnosis: Difficulty walking  PT Problem List: Decreased activity tolerance;Decreased balance;Decreased strength;Decreased mobility;Decreased cognition;Decreased knowledge of use of DME;Decreased safety awareness;Decreased knowledge of precautions;Pain PT Treatment Interventions: DME instruction;Gait training;Stair training;Functional mobility training;Therapeutic activities;Therapeutic exercise;Balance training;Neuromuscular re-education;Patient/family education     PT Goals(Current goals can be found in the care plan section) Acute Rehab PT Goals Patient Stated Goal: not stated PT Goal Formulation: With patient Time For Goal Achievement: 08/17/13 Potential to Achieve Goals: Good  Visit Information  Last PT Received On: 08/10/13 Assistance Needed: +2 (for chair follow and safety) History of Present Illness: 67 y.o. s/p  Redo decompressive laminectomy decompressing the L1 and L2 roots  (2 levels) ,       Prior Functioning  Home Living Family/patient expects to be discharged to:: Private residence Living Arrangements: Other (Comment) (sister in law) Available Help at Discharge: Family;Friend(s);Available 24 hours/day Type of Home: Mobile home Home Access: Stairs to enter Entrance Stairs-Number of Steps: 3 Entrance Stairs-Rails:  (rails on both sides) Home Layout: One level Home Equipment: Other (comment);Gilmer Mor - single point (sister in law  has walker with  4 wheels) Prior Function Level of Independence: Independent with assistive device(s) Comments: used cane sometimes Communication Communication: Other (comment) (slurred speech) Dominant Hand: Right    Cognition  Cognition Arousal/Alertness: Awake/alert Behavior During Therapy: WFL for tasks assessed/performed Overall Cognitive Status: Impaired/Different from baseline Area of Impairment: Attention;Memory;Safety/judgement;Awareness;Problem solving Current Attention Level: Selective Memory: Decreased recall of precautions;Decreased short-term memory Following Commands: Follows one step commands with increased time;Follows multi-step commands inconsistently Safety/Judgement: Decreased awareness of safety Awareness: Anticipatory;Emergent Problem Solving: Slow processing;Difficulty sequencing;Requires verbal cues;Requires tactile cues    Extremity/Trunk Assessment Upper Extremity Assessment Upper Extremity Assessment: Defer to OT evaluation Lower Extremity Assessment Lower Extremity Assessment: Generalized weakness   Balance Balance Balance Assessed: Yes Static Sitting Balance Static Sitting - Balance Support: No upper extremity supported;Feet supported Static Sitting - Level of Assistance: 4: Min assist;3: Mod assist Static Sitting - Comment/# of Minutes: Pt sitting EOB and required Min A/Mod A for balance. Several cues to scoot out for feet to touch floor. Static Standing Balance Static Standing - Balance Support: Bilateral upper extremity supported Static Standing - Level of Assistance: 4: Min assist Dynamic Standing Balance Dynamic Standing - Balance Support: Left upper extremity supported;During functional activity Dynamic Standing - Level of Assistance: 4: Min assist Dynamic Standing - Comments: Pt stood at sink and brushed teeth. Min A for balance.  End of Session PT - End of Session Equipment Utilized During Treatment: Gait belt;Back brace Activity Tolerance:  Patient limited by fatigue Patient left: in chair;with call bell/phone within reach Nurse Communication: Mobility status  GP     Sunny Schlein, [place] [phone] 08/10/2013, 2:58 PM

## 2013-08-10 NOTE — Progress Notes (Signed)
   CARE MANAGEMENT NOTE 08/10/2013  Patient:  [name], [name]   Account Number:  [id number]  Date Initiated:  08/10/2013  Documentation initiated by:  Jiles Crocker  Subjective/Objective Assessment:   ADMITTED FOR SURGERY - Redo decompressive laminectomy decompressing the L1 and L2 roots     Action/Plan:   CM FOLLOWING FOR DCP   Anticipated DC Date:  08/14/2013   Anticipated DC Plan:  POSSIBLY HOME W HOME HEALTH SERVICES, AWAITING PT/OT EVALS;     DC Planning Services  CM consult          Status of service:  In process, will continue to follow Medicare Important Message given?  NA - LOS <3 / Initial given by admissions (If response is "NO", the following Medicare IM given date fields will be blank)  Per UR Regulation:  Reviewed for med. necessity/level of care/duration of stay  Comments:  11/18/2014Abelino Derrick RN,BSN,MHA [phone]

## 2013-08-10 NOTE — Progress Notes (Addendum)
Occupational Therapy Treatment Patient Details Name: Ricardo WENIG Sr. MRN: [id number] DOB: 11/04/1945 Today's Date: 08/10/2013 Time: 4401-0272 OT Time Calculation (min): 30 min  OT Assessment / Plan / Recommendation  History of present illness 67 y.o. s/p  Redo decompressive laminectomy decompressing the L1 and L2 roots  (2 levels) ,   OT comments  Pt lethargic and with decreased safety awareness/difficulty following commands during session. OT provided education and pt performed grooming at sink and while sitting EOB.   Follow Up Recommendations  Home health OT;Supervision/Assistance - 24 hour    Barriers to Discharge       Equipment Recommendations  3 in 1 bedside comode;Other (comment) (tub/shower equipment tbd)    Recommendations for Other Services    Frequency Min 2X/week   Progress towards OT Goals Progress towards OT goals: Progressing toward goals  Plan Discharge plan remains appropriate    Precautions / Restrictions Precautions Precautions: Fall;Back Precaution Booklet Issued: No Precaution Comments: Reviewed precautions with pt. Required Braces or Orthoses: Spinal Brace Spinal Brace: Thoracolumbosacral orthotic;Applied in sitting position   Pertinent Vitals/Pain Pain 8/10. Repositioned. O2 in 80's on RA-educated on deep breathing. Pt was placed back on O2 at end of session.     ADL  Grooming: Wash/dry face;Teeth care;Minimal assistance;Moderate assistance (assist for balance) Where Assessed - Grooming: Supported sitting;Supported standing;Unsupported sitting Upper Body Dressing: Maximal assistance Where Assessed - Upper Body Dressing: Unsupported sitting Toilet Transfer: Minimal assistance;Moderate assistance Toilet Transfer Method: Sit to stand Toilet Transfer Equipment: Bedside commode Equipment Used: Back brace;Gait belt;Rolling walker Transfers/Ambulation Related to ADLs: +2 to ambulate back to bed for safety. Min/Mod A for transfers. ADL Comments: Pt  sitting on EOB and washed face and assisted with donning/doffing back brace-pt requiring Min A/Mod A for balance while sitting on EOB and several cues to scoot out until feet touch floor. Pt requiring assistance at sink also for balance. OT educated on use of two cups for teeth care as well as setting grooming items on right side of sink to avoid breaking precautions. Educated to stand in front of chair/bed with walker in front when pulling up LB clothing. Educated that long handled sponge and reacher may be helpful for him to use.  Educated on back precautions.    OT Diagnosis:    OT Problem List:   OT Treatment Interventions:     OT Goals(current goals can now be found in the care plan section) Acute Rehab OT Goals Patient Stated Goal: not stated OT Goal Formulation: With patient Time For Goal Achievement: 08/16/13 Potential to Achieve Goals: Good ADL Goals Pt Will Perform Grooming: with modified independence;standing Pt Will Transfer to Toilet: with modified independence;ambulating (3 in 1 over commode) Pt Will Perform Toileting - Clothing Manipulation and hygiene: with modified independence;sit to/from stand Pt Will Perform Tub/Shower Transfer: Tub transfer;with supervision;ambulating;rolling walker (tub equipment tbd) Additional ADL Goal #1: Pt will independently verbalize 3/3 back precautions.  Additional ADL Goal #2: Caregiver will be independent in donning/doffing back brace.  Additional ADL Goal #3: Pt will perform bed mobility at Mod I level as precursor for ADLs.   Visit Information  Last OT Received On: 08/10/13 Assistance Needed: +2 History of Present Illness: 67 y.o. s/p  Redo decompressive laminectomy decompressing the L1 and L2 roots  (2 levels) ,    Subjective Data      Prior Functioning       Cognition  Cognition Arousal/Alertness: Lethargic;Suspect due to medications Behavior During Therapy: [facility] for tasks assessed/performed (agitated  at times) Overall Cognitive  Status: No family/caregiver present to determine baseline cognitive functioning Area of Impairment: Following commands;Safety/judgement;Memory Memory: Decreased recall of precautions;Decreased short-term memory Following Commands: Follows one step commands inconsistently Safety/Judgement: Decreased awareness of safety    Mobility  Bed Mobility Bed Mobility: Rolling Left;Left Sidelying to Sit;Sitting - Scoot to [place] of Bed;Sit to Sidelying Left;Rolling Right Rolling Right: 4: Min assist Rolling Left: 4: Min guard Left Sidelying to Sit: 4: Min assist Sitting - Scoot to Edge of Bed: 4: Min guard Sit to Sidelying Left: 4: Min guard Details for Bed Mobility Assistance: Cues for technique. Assist to lift trunk. Transfers Transfers: Sit to Stand;Stand to Sit Sit to Stand: 4: Min assist;With upper extremity assist;From bed;From chair/3-in-1 Stand to Sit: 4: Min assist;3: Mod assist;With upper extremity assist;To bed;To chair/3-in-1 Details for Transfer Assistance: Pt sitting quickly on 3 in 1 while in bathroom. Legs appeared to be weak and giving it on him.     Exercises      Balance Balance Balance Assessed: Yes Static Sitting Balance Static Sitting - Balance Support: No upper extremity supported;Feet supported Static Sitting - Level of Assistance: 4: Min assist;3: Mod assist Static Sitting - Comment/# of Minutes: Pt sitting EOB and required Min A/Mod A for balance. Several cues to scoot out for feet to touch floor. Dynamic Standing Balance Dynamic Standing - Balance Support: Left upper extremity supported;During functional activity Dynamic Standing - Level of Assistance: 4: Min assist Dynamic Standing - Comments: Pt stood at sink and brushed teeth. Min A for balance.   End of Session OT - End of Session Equipment Utilized During Treatment: Gait belt;Rolling walker;Back brace Activity Tolerance: Patient limited by lethargy Patient left: in bed;with call bell/phone within reach;with bed  alarm set Nurse Communication: Mobility status  GO     Earlie Raveling OTR/L [phone] 08/10/2013, 12:00 PM

## 2013-08-10 NOTE — Progress Notes (Signed)
Pt still unable to void. Bladder scan done and it showed more than 525 ml. In and out cath done and was able to empty 700 ml of yellow colored urine. Procedure done using sterile technique. Pt able to tolerate the procedure. Placed pt comfortably in bed. Call bell within reach. Will continue to monitor pt.

## 2013-08-11 ENCOUNTER — Telehealth: Payer: Self-pay | Admitting: *Deleted

## 2013-08-11 ENCOUNTER — Other Ambulatory Visit: Payer: Self-pay | Admitting: Internal Medicine

## 2013-08-11 MED ORDER — MORPHINE SULFATE 2 MG/ML IJ SOLN: 1.0000 mg | INTRAMUSCULAR | Status: DC | PRN

## 2013-08-11 MED FILL — Heparin Sodium (Porcine) Inj 1000 Unit/ML: INTRAMUSCULAR | Qty: 30 | Status: AC

## 2013-08-11 MED FILL — Sodium Chloride IV Soln 0.9%: INTRAVENOUS | Qty: 1000 | Status: AC

## 2013-08-11 NOTE — Progress Notes (Signed)
Occupational Therapy Treatment Patient Details Name: [name] Sr. MRN: [id number] DOB: June 04, 1946 Today's Date: 08/11/2013 Time: 6213-0865 OT Time Calculation (min): 31 min  OT Assessment / Plan / Recommendation  History of present illness [age] y.o. s/p  Redo decompressive laminectomy decompressing the L1 and L2 roots  (2 levels) ,   OT comments  Pt lethargic during session requiring several cues to remain alert. Pt may need SNF prior to d/c home, but pt does not seem very open to this idea. Ambulated to bathroom and practiced toilet transfer as well as grooming at sink.   Follow Up Recommendations  Home health OT;Supervision/Assistance - 24 hour    Barriers to Discharge       Equipment Recommendations  3 in 1 bedside comode;Other (comment) (tub/shower equipment tbd)    Recommendations for Other Services    Frequency Min 2X/week   Progress towards OT Goals Progress towards OT goals: Progressing toward goals (stated 2/3 precautions at beginning of session)  Plan Discharge plan remains appropriate    Precautions / Restrictions Precautions Precautions: Fall;Back Precaution Booklet Issued: No Precaution Comments: Reviewed back precautions with pt; initially able to state 2/3 Required Braces or Orthoses: Spinal Brace Spinal Brace: Thoracolumbosacral orthotic;Applied in sitting position Restrictions Weight Bearing Restrictions: No   Pertinent Vitals/Pain Pain 9/10 in back. Repositioned.     ADL  Grooming: Wash/dry face;Teeth care;Minimal assistance Where Assessed - Grooming: Supported sitting;Supported standing Upper Body Dressing: Maximal assistance (back brace) Where Assessed - Upper Body Dressing: Unsupported sitting Toilet Transfer: Moderate assistance Toilet Transfer Method: Sit to stand Toilet Transfer Equipment: Raised toilet seat with arms (or 3-in-1 over toilet) Toileting - Clothing Manipulation and Hygiene: +1 Total assistance Where Assessed - Toileting  Clothing Manipulation and Hygiene: Sit to stand from 3-in-1 or toilet Equipment Used: Back brace;Gait belt;Rolling walker Transfers/Ambulation Related to ADLs: +2 for ambulation for safety. Mod A for sit <> stand transfers. ADL Comments: OT reviewed education on setting grooming supplies on right side of sink and standing in front of bed/chair with walker in front when pulling up LB clothing. Educated to sit to bathe. Reviewed precautions several times.  Washed face standing with Min A for balance and to squeeze wash cloth out. Sat and performed oral care with swab and stood at sink to rinse out mouth.      OT Diagnosis:    OT Problem List:   OT Treatment Interventions:     OT Goals(current goals can now be found in the care plan section) Acute Rehab OT Goals Patient Stated Goal: not stated OT Goal Formulation: With patient Time For Goal Achievement: 08/16/13 Potential to Achieve Goals: Good ADL Goals Pt Will Perform Grooming: with modified independence;standing Pt Will Transfer to Toilet: with modified independence;ambulating (3 in 1 over commode) Pt Will Perform Toileting - Clothing Manipulation and hygiene: with modified independence;sit to/from stand Pt Will Perform Tub/Shower Transfer: Tub transfer;with supervision;ambulating;rolling walker (tub equipment tbd) Additional ADL Goal #1: Pt will independently verbalize 3/3 back precautions.  Additional ADL Goal #2: Caregiver will be independent in donning/doffing back brace.  Additional ADL Goal #3: Pt will perform bed mobility at Mod I level as precursor for ADLs.   Visit Information  Last OT Received On: 08/11/13 Assistance Needed: +2 History of Present Illness: [age] y.o. s/p  Redo decompressive laminectomy decompressing the L1 and L2 roots  (2 levels) ,    Subjective Data      Prior Functioning       Cognition  Cognition  Arousal/Alertness: Lethargic Behavior During Therapy: Flat affect Overall Cognitive Status:  Impaired/Different from baseline Area of Impairment: Memory;Safety/judgement;Problem solving;Following commands Current Attention Level: Selective Memory: Decreased recall of precautions;Decreased short-term memory Following Commands: Follows one step commands with increased time Safety/Judgement: Decreased awareness of safety Awareness: Anticipatory;Emergent Problem Solving: Slow processing General Comments: Pt very lethargic.     Mobility  Bed Mobility Bed Mobility: Rolling Left;Rolling Right;Left Sidelying to Sit;Sitting - Scoot to [place] of Bed;Sit to Sidelying Left Rolling Right: 3: Mod assist Rolling Left: 4: Min guard Left Sidelying to Sit: 4: Min assist Sitting - Scoot to Edge of Bed: 4: Min guard Sit to Sidelying Left: 4: Min assist Details for Bed Mobility Assistance: Assisted with getting LE's onto bed into sidelying position. Cues for technique.  Transfers Transfers: Sit to Stand;Stand to Sit Sit to Stand: 3: Mod assist;With upper extremity assist;From bed;From chair/3-in-1 Stand to Sit: 3: Mod assist;To chair/3-in-1;To bed Details for Transfer Assistance: Cues for hand placement.     Exercises      Balance     End of Session OT - End of Session Equipment Utilized During Treatment: Gait belt;Rolling walker;Back brace Activity Tolerance: Patient limited by lethargy Patient left: in bed;with call bell/phone within reach;with bed alarm set Nurse Communication: Mobility status  GO     Earlie Raveling OTR/L [phone] 08/11/2013, 4:14 PM

## 2013-08-11 NOTE — Telephone Encounter (Signed)
traZODone (DESYREL) tablet 300 mg Last refill: Last OV:

## 2013-08-11 NOTE — Progress Notes (Signed)
Doing well. C/o appropriate incisional soreness. Less leg pain No Numbness, tingling, weakness No Nausea /vomiting Amb/ voiding well  Temp:  [97.8 F (36.6 C)-101.4 F (38.6 C)] 100.3 F (37.9 C) (11/19 0603) Pulse Rate:  [68-80] 70 (11/19 0603) Resp:  [12-18] 18 (11/19 0603) BP: (96-129)/(45-64) 120/64 mmHg (11/19 0603) SpO2:  [93 %-100 %] 93 % (11/19 0603) Good strength and sensation Incision CDI  Plan: Increase activity  - ? D/c in am

## 2013-08-11 NOTE — Progress Notes (Signed)
Seen and agreed 08/11/2013 Robinette, Julia Elizabeth PTA [phone] pager [phone] office    

## 2013-08-11 NOTE — Progress Notes (Signed)
Pt had fever of 101.4. MD on call paged but did not call back. Incentive spirometer and increase fluid intake encouraged. Fever went down to 99.5. Did not re-paged MD. Will continue to encourage pt to use IS. Will continue to monitor pt.

## 2013-08-11 NOTE — Progress Notes (Signed)
Physical Therapy Treatment Patient Details Name: [name] Sr. MRN: [id number] DOB: 05-20-1946 Today's Date: 08/11/2013 Time: 6578-4696 PT Time Calculation (min): 30 min  PT Assessment / Plan / Recommendation  History of Present Illness 67 y.o. s/p  Redo decompressive laminectomy decompressing the L1 and L2 roots  (2 levels) ,   PT Comments   Pt very lethargic today thus slowly progressing. Pt required max cues to remain alert throughout PT today and to perform mobility tasks as noted. May consider SNF depending on how pt continues to progress however at this time continue to recommend HHPT to increase functional independence at home. Pt is unsafe to ambulate independently.   Follow Up Recommendations  Supervision/Assistance - 24 hour;Home health PT     Does the patient have the potential to tolerate intense rehabilitation     Barriers to Discharge        Equipment Recommendations  Rolling walker with 5" wheels    Recommendations for Other Services    Frequency Min 5X/week   Progress towards PT Goals Progress towards PT goals: Progressing toward goals  Plan Current plan remains appropriate    Precautions / Restrictions Precautions Precautions: Fall;Back Precaution Comments: Reviewed precautions with pt.; pt recalled 1/3  Required Braces or Orthoses: Spinal Brace Spinal Brace: Thoracolumbosacral orthotic;Applied in sitting position Restrictions Weight Bearing Restrictions: No   Pertinent Vitals/Pain Pt experiencing LBP with ambulation requiring seated rest break ,did not rate. Pt verbalized that he was comfort when repositioned in bed post tx    Mobility  Bed Mobility Bed Mobility: Rolling Left;Rolling Right;Left Sidelying to Sit;Sitting - Scoot to Edge of Bed;Sit to Sidelying Left Rolling Right: 5: Supervision Rolling Left: 5: Supervision Left Sidelying to Sit: With rails;4: Min assist Sitting - Scoot to Edge of Bed: 4: Min assist Sit to Sidelying Left: With  rail;4: Min assist Details for Bed Mobility Assistance: cues for safe technique with scooting and sidelying<->sit Transfers Transfers: Sit to Stand;Stand to Sit;Stand Pivot Transfers Sit to Stand: From bed;From chair/3-in-1;4: Min assist;With upper extremity assist Stand to Sit: To bed;To chair/3-in-1;4: Min assist;With upper extremity assist Stand Pivot Transfers: 1: +2 Total assist Stand Pivot Transfers: Patient Percentage: 70% Details for Transfer Assistance: cues for safe technique and safety with mobility.  sit<>stand x5; transfer bed->recliner->bed due to patient unsafe in recliner Ambulation/Gait Ambulation/Gait Assistance: 4: Min guard (for chair/IV pole) Ambulation Distance (Feet): 7 Feet Assistive device: Rolling walker Ambulation/Gait Assistance Details: vc's for technique with RW and erect posture. Pt experiencing pain and was impulsive when ready to sit thus second person needed today to have chair behind so that one person could be min guard due to unsteadiness Gait Pattern: Step-through pattern;Decreased stride length;Trunk flexed Stairs: No    Exercises     PT Diagnosis:    PT Problem List:   PT Treatment Interventions:     PT Goals (current goals can now be found in the care plan section)    Visit Information  Last PT Received On: 08/11/13 Assistance Needed: +2 History of Present Illness: 67 y.o. s/p  Redo decompressive laminectomy decompressing the L1 and L2 roots  (2 levels) ,    Subjective Data      Cognition  Cognition Arousal/Alertness: Lethargic Behavior During Therapy: Flat affect Overall Cognitive Status: Impaired/Different from baseline Area of Impairment: Attention;Memory;Safety/judgement;Awareness;Problem solving Current Attention Level: Selective Memory: Decreased recall of precautions;Decreased short-term memory Following Commands: Follows one step commands with increased time;Follows multi-step commands inconsistently Safety/Judgement:  Decreased awareness of safety Awareness:  Anticipatory;Emergent Problem Solving: Slow processing;Difficulty sequencing;Requires verbal cues;Requires tactile cues General Comments: Pt very lethargic today requiring constant verbal and tactile cues throughout PT to become alert; nursing notified and said that he hadn't been given medications to give reason to this    Balance     End of Session PT - End of Session Equipment Utilized During Treatment: Gait belt;Back brace Activity Tolerance: Patient limited by lethargy Patient left: with call bell/phone within reach;in bed;with bed alarm set Nurse Communication: Mobility status (lethargy)   GP     Ulice Follett, SPTA 08/11/2013, 1:49 PM

## 2013-08-12 DIAGNOSIS — M48061 Spinal stenosis, lumbar region without neurogenic claudication: Secondary | ICD-10-CM

## 2013-08-12 MED ORDER — MORPHINE SULFATE 2 MG/ML IJ SOLN: 1.0000 mg | INTRAMUSCULAR | Status: DC | PRN

## 2013-08-12 MED ORDER — METHOCARBAMOL 500 MG PO TABS
500.0000 mg | ORAL_TABLET | Freq: Three times a day (TID) | ORAL | Status: DC | PRN
  Administered 2013-08-16: 500 mg via ORAL
  Filled 2013-08-12: qty 1

## 2013-08-12 NOTE — Progress Notes (Signed)
Physical Therapy Treatment Patient Details Name: [name]. MRN: [id number] DOB: 24-Jun-1946 Today's Date: 08/12/2013 Time: 6295-2841 PT Time Calculation (min): 23 min  PT Assessment / Plan / Recommendation  History of Present Illness 67 y.o. s/p  Redo decompressive laminectomy decompressing the L1 and L2 roots  (2 levels) ,   PT Comments   Patient with much improved activity tolerance today and decreased assist needed for mobility.  Seems to still have difficulty with safety awareness, but feel continued repetition in his home environment with HHPT will be best hope for carryover.     Follow Up Recommendations  Home health PT;Supervision/Assistance - 24 hour     Does the patient have the potential to tolerate intense rehabilitation   N/A  Barriers to Discharge  None      Equipment Recommendations  Rolling walker with 5" wheels    Recommendations for Other Services  None  Frequency Min 5X/week   Progress towards PT Goals Progress towards PT goals: Progressing toward goals  Plan Current plan remains appropriate    Precautions / Restrictions Precautions Precautions: Fall;Back Precaution Comments: Reviewed back precautions with pt Required Braces or Orthoses: Spinal Brace Spinal Brace: Thoracolumbosacral orthotic;Applied in sitting position   Pertinent Vitals/Pain 8/10 in back but pt reports much improved over yesterday    Mobility  Bed Mobility Bed Mobility: Scooting to [facility] Rolling Left: 5: Supervision;With rail Left Sidelying to Sit: 5: Supervision;With rails;HOB elevated Sit to Sidelying Left: 5: Supervision;With rail;HOB elevated Scooting to [facility]: With trapeze;5: Supervision Details for Bed Mobility Assistance: still needs cues for safety with moblity due to impulsivity  Transfers Sit to Stand: 5: Supervision;From bed;From chair/3-in-1 Stand to Sit: 5: Supervision;To bed;To chair/3-in-1 Details for Transfer Assistance: sits in wheelchair with uncontrolled  descent.  Encouraged hand placement on arms of chair for slower transition pt reported he always forgets that. Ambulation/Gait Ambulation/Gait Assistance: 5: Supervision;4: Min guard Ambulation Distance (Feet): 40 Feet (in room) Assistive device: Rolling walker Ambulation/Gait Assistance Details: pt reluctant to walk far due to reports doesnt walk far at home.  Remains in flexed posture despite TLSO and seems unable to return to erect posture.  Has unsafe walker use hitting obstacles in the room and not taking time to maneuver around; complained about no swivel wheels on walker and educated pt on how to turn walker more safely Gait Pattern: Trunk flexed;Decreased stride length;Step-through pattern Stairs: Yes Stairs Assistance: 5: Supervision Stairs Assistance Details (indicate cue type and reason): for safety and walker management Stair Management Technique: Two rails;Alternating pattern;Forwards Number of Stairs: 4      PT Goals (current goals can now be found in the care plan section)    Visit Information  Last PT Received On: 08/12/13 Assistance Needed: +1 History of Present Illness: 67 y.o. s/p  Redo decompressive laminectomy decompressing the L1 and L2 roots  (2 levels) ,    Subjective Data   I don't walk much   Cognition  Cognition Arousal/Alertness: Awake/alert Behavior During Therapy: Impulsive Overall Cognitive Status: No family/caregiver present to determine baseline cognitive functioning Current Attention Level: Sustained Memory: Decreased recall of precautions Following Commands: Follows one step commands consistently Safety/Judgement: Decreased awareness of safety Problem Solving: Slow processing    Balance  Static Standing Balance Static Standing - Balance Support: No upper extremity supported Static Standing - Level of Assistance: 5: Stand by assistance Static Standing - Comment/# of Minutes: standing in room to move from bed to wheelchair to transport for  stair negotiation transferred  without device but continued flexed posture  End of Session PT - End of Session Equipment Utilized During Treatment: Gait belt;Back brace Activity Tolerance: Patient tolerated treatment well Patient left: in bed;with call bell/phone within reach   GP     [facility] 08/12/2013, 11:48 AM Sheran Lawless, PT [phone] 08/12/2013

## 2013-08-12 NOTE — Consult Note (Signed)
Physical Medicine and Rehabilitation Consult  Reason for Consult: Lumbar stenosis with radiculopathy Referring Physician:  [name].   HPI: Ricardo BLATZ Sr. is a 67 y.o. male with history of COPD. 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 Marland Kitchen He was admitted on  08/09/13 for redo decompressive laminectomy L1 and L2 nerve roots and PLIF L1-2 and removal of piro hardware with fusion L2-3 by [name]. Post op with pain and sedation affecting overall mobility and safety. MD recommending CIR.   Patient feels much better today. He was listening yesterday. He slept well last night and his walked up and down steps with just supervision assistance from therapy. No shortness of breath or discomfort. Review of Systems  Constitutional: Negative.   HENT: Negative.   Eyes: Negative.   Respiratory: Negative.   Cardiovascular: Negative.   Gastrointestinal: Positive for constipation.  Genitourinary: Negative.   Musculoskeletal: Positive for back pain.  Skin: Negative.   Neurological: Negative.   Endo/Heme/Allergies: Negative.   Psychiatric/Behavioral: Negative.     Past Medical History  Diagnosis Date  . Depression   . COPD, mild      PFTs 12/2008  . Hyperlipidemia   . Colon polyp     three 2-23mm polyps in descending colon, medium sized lipoma in sigmoid colon,  multiple in recto-sigmoid colon  . NSTEMI (non-ST elevated myocardial infarction) 02-2011  . Coronary atherosclerosis of native coronary artery     Multivessel, stent LAD 1998, thrombectomy 2006, PTCA 2011, NSTEMI 6/12 managed medically;  LexiScan Myoview (10/14):  Difficult study, inferior scar with peri-infarct ischemia, inferior HK, EF 45%; low risk  . Full dentures   . Wears glasses   . 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  XBJYNWGN(562.1)     Past Surgical History  Procedure Laterality Date  . Appendectomy    . Tonsillectomy    . Lumbar fusion      x5 Dr. Samara Deist  . 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 Coronary stent placement  1998    placed in the LAD  . Thrombectomy  02/2005    acute non ST segment elevation myocardial infarction  . Coronary stent placement  02/2005    PCI/drug-eluting stent implantation mid-LAD  . Balloon angioplasty, artery  04/2010    sp PTCA  . Nasal fracture surgery      x2  . Inguinal hernia repair      x3  . Back surgery  1986  . Mass excision Left 03/09/2013    Procedure: EXCISION left chest wall MASS;  Surgeon: Maisie Fus A. Cornett, MD;  Location: [facility];  Service: General;  Laterality: Left;  . Lumbar laminectomy  08/09/2013     L1  L2    Dr Phoebe Perch    Family History  Problem Relation Age of Onset  . Heart attack Mother   . Lung cancer Father     +smoker  . Diabetes      GM, aunt, other memebers  . Prostate cancer Neg Hx        . Colon cancer Neg Hx    Social History: Lives with sister in law. Per reports that he quit smoking about 3 months ago. His smoking use included Cigarettes. He has a 25 pack-year smoking history.  He has never used smokeless tobacco. Per reports that he drinks alcohol. Per reports that he does not use illicit drugs.   Allergies  Allergen Reactions  . Acetaminophen     REACTION: tongue swells  . Codeine     REACTION: Makes "him mean"  . Hydrocodone-Acetaminophen     REACTION: nausea, vomiting "makes me deathly sick"  . Tetracycline     REACTION: loss of balance  . Vicodin [Hydrocodone-Acetaminophen]     Medications Prior to Admission  Medication Sig Dispense Refill  . aspirin EC 325 MG tablet Take 1 tablet (325 mg total) by mouth daily.      Marland Kitchen ezetimibe (ZETIA) 10 MG tablet Take 1 tablet (10 mg total) by mouth daily.  90 tablet  1  . sertraline (ZOLOFT) 50 MG tablet Take 1 tablet (50 mg total) by mouth  daily.  30 tablet  1  . traZODone (DESYREL) 150 MG tablet Take 2 tablets (300 mg total) by mouth at bedtime. 2 by mouth at bedtime  180 tablet  1  . nitroGLYCERIN (NITROSTAT) 0.4 MG SL tablet Place 1 tablet (0.4 mg total) under the tongue as directed. 1 tablet under tongue at onset of chest pain; you may repeat every  Minutes for up to 3 doses  25 tablet  3    Home: Home Living Family/patient expects to be discharged to:: Private residence Living Arrangements: Other (Comment) (sister in law) Available Help at Discharge: Family;Friend(s);Available 24 hours/day Type of Home: Mobile home Home Access: Stairs to enter Entrance Stairs-Number of Steps: 3 Entrance Stairs-Rails:  (rails on both sides) Home Layout: One level Home Equipment: Other (comment);Cane - single point (sister in law has walker with 4 wheels)  Functional History: Prior Function Comments: used cane sometimes Functional Status:  Mobility: Bed Mobility Bed Mobility: Rolling Left;Rolling Right;Left Sidelying to Sit;Sitting - Scoot to [place] of Bed;Sit to Sidelying Left Rolling Right: 3: Mod assist Rolling Left: 4: Min guard Left Sidelying to Sit: 4: Min assist Sitting - Scoot to Edge of Bed: 4: Min guard Sit to Sidelying Left: 4: Min assist Transfers Transfers: Sit to Stand;Stand to [employer] Transfers Sit to Stand: 3: Mod assist;With upper extremity assist;From bed;From chair/3-in-1 Stand to Sit: 3: Mod assist;To chair/3-in-1;To bed Stand Pivot Transfers: 1: +2 Total assist Stand Pivot Transfers: Patient Percentage: 70% Ambulation/Gait Ambulation/Gait Assistance: 4: Min guard (for chair/IV pole) Ambulation Distance (Feet): 7 Feet Assistive device: Rolling walker Ambulation/Gait Assistance Details: vc's for technique with RW and erect posture. Pt experiencing pain and was impulsive when ready to sit thus second person needed today to have chair behind so that one person could be min guard due to unsteadiness Gait  Pattern: Step-through pattern;Decreased stride length;Trunk flexed Stairs: No Wheelchair Mobility Wheelchair Mobility: No  ADL: ADL Eating/Feeding: Independent Where Assessed - Eating/Feeding: Chair Grooming: Wash/dry face;Teeth care;Minimal assistance Where Assessed - Grooming: Supported sitting;Supported standing Upper Body Dressing: Maximal assistance (back brace) Where Assessed - Upper Body Dressing: Unsupported sitting Lower Body Dressing: Moderate assistance Where Assessed - Lower Body Dressing: Supported sit to Pharmacist, hospital: Moderate assistance Toilet Transfer Method: Sit to stand Toilet Transfer Equipment: Raised toilet seat with arms (or 3-in-1 over toilet) Tub/Shower Transfer Method: Not assessed Equipment Used: Back brace;Gait belt;Rolling walker Transfers/Ambulation Related to ADLs: +2 for ambulation for safety. Mod A for sit <> stand transfers. ADL Comments: OT reviewed education on setting grooming supplies on right side of sink and standing in front of bed/chair with walker in front when  pulling up LB clothing. Educated to sit to bathe. Reviewed precautions several times.  Washed face standing with Min A for balance and to squeeze wash cloth out. Sat and performed oral care with swab and stood at sink to rinse out mouth.    Cognition: Cognition Overall Cognitive Status: Impaired/Different from baseline Orientation Level: Oriented to person;Oriented to place;Oriented to situation Cognition Arousal/Alertness: Lethargic Behavior During Therapy: Flat affect Overall Cognitive Status: Impaired/Different from baseline Area of Impairment: Memory;Safety/judgement;Problem solving;Following commands Current Attention Level: Selective Memory: Decreased recall of precautions;Decreased short-term memory Following Commands: Follows one step commands with increased time Safety/Judgement: Decreased awareness of safety Awareness: Anticipatory;Emergent Problem Solving: Slow  processing General Comments: Pt very lethargic.   Blood pressure 104/50, pulse 69, temperature 98.9 F (37.2 C), temperature source Oral, resp. rate 20, height 5' 10.87" (1.8 m), weight 90.5 kg (199 lb 8.3 oz), SpO2 97.00%. Physical Exam  No results found for this or any previous visit (from the past 24 hour(s)). No results found.  Assessment/Plan: Diagnosis: Lumbar stenosis status post L1-L2 fusion 1. Does the need for close, 24 hr/day medical supervision in concert with the patient's rehab needs make it unreasonable for this patient to be served in a less intensive setting? No 2. Co-Morbidities requiring supervision/potential complications: COPD, coronary artery disease 3. Due to bowel management, skin/wound care and pain management, does the patient require 24 hr/day rehab nursing? Potentially 4. Does the patient require coordinated care of a physician, rehab nurse, Not applicable to address physical and functional deficits in the context of the above medical diagnosis(es)? No Addressing deficits in the following areas: Not applicable 5. Can the patient actively participate in an intensive therapy program of at least 3 hrs of therapy per day at least 5 days per week? Yes 6. The potential for patient to make measurable gains while on inpatient rehab is good 7. Anticipated functional outcomes upon discharge from inpatient rehab are not applicable with PT, NAwith OT, NA with SLP. 8. Estimated rehab length of stay to reach the above functional goals is: NA 9. Does the patient have adequate social supports to accommodate these discharge functional goals? Yes 10. Anticipated D/C setting: Home 11. Anticipated post D/C treatments: HH therapy 12. Overall Rehab/Functional Prognosis: excellent  RECOMMENDATIONS: This patient's condition is appropriate for continued rehabilitative care in the following setting: [facility] Patient has agreed to participate in recommended program. Yes Note that insurance  prior authorization may be required for reimbursement for recommended care.  Comment:     08/12/2013

## 2013-08-12 NOTE — Progress Notes (Signed)
C/o appropriate incisional soreness. Less leg pain  Amb/ voiding  But not enough  Temp:  [98.5 F (36.9 C)-100.2 F (37.9 C)] 98.9 F (37.2 C) (11/20 0938) Pulse Rate:  [68-78] 69 (11/20 0938) Resp:  [18-20] 20 (11/20 0938) BP: (93-130)/(49-62) 104/50 mmHg (11/20 0938) SpO2:  [92 %-97 %] 97 % (11/20 0938) Good strength and sensation Incision mod drainage  Plan: Increase activity  - more dressing changes - consult rehab/ possible SNF

## 2013-08-13 NOTE — Progress Notes (Signed)
PT Cancellation Note  Patient Details Name: Ricardo BERKOVICH Sr. MRN: [id number] DOB: [birth year]   Cancelled Treatment:    Reason Eval/Treat Not Completed: Pt reports he walked a long way with nursing earlier today and is in a lot of pain. He states he is too tired and in too much pain to participate at this time, but asking to make it up tomorrow. Will continue to follow.    Ruthann Cancer 08/13/2013, 3:32 PM  Ruthann Cancer, PT, DPT [phone]

## 2013-08-13 NOTE — Progress Notes (Signed)
Rehab admissions - Evaluated for possible admission.  Please see rehab consult done by Dr. Wynn Banker recommending [facility] therapies.  Agree with home with [facility] therapies when medically ready for discharge.  Call me for questions.  #[phone]

## 2013-08-13 NOTE — Progress Notes (Signed)
Doing well. Making progress with activity  Temp:  [98.4 F (36.9 C)-99.8 F (37.7 C)] 98.4 F (36.9 C) (11/21 0448) Pulse Rate:  [56-72] 68 (11/21 0448) Resp:  [18-20] 18 (11/21 0448) BP: (101-119)/(50-82) 117/82 mmHg (11/21 0448) SpO2:  [94 %-99 %] 94 % (11/21 0448) Good strength and sensation Incision still sig drainage  -   Plan: Continue increasing activity  -  Needs specialized wound care with frequent dressing changes and only has a sister in law at home who will provide assistance  - but not nursing care  -  i think short rehab stay, or SNF to give proper wound care and supervise /assist in increasing activity is needed  -   Keep here until this arranged  -

## 2013-08-13 NOTE — Clinical Social Work Psychosocial (Signed)
Clinical Social Work Department BRIEF PSYCHOSOCIAL ASSESSMENT 08/13/2013  Patient:  [name], [name]     Account Number:  [id number]     Admit date:  08/09/2013  Clinical Social Worker:  Sherre Lain  Date/Time:  08/13/2013 03:56 PM  Referred by:  Care Management  Date Referred:  08/13/2013 Referred for  SNF Placement   Other Referral:   none.   Interview type:  Patient Other interview type:   none.    PSYCHOSOCIAL DATA Living Status:  FAMILY Admitted from facility:   Level of care:   Primary support name:  [name] Primary support relationship to patient:  CHILD, ADULT Degree of support available:   Pt stated that he lives with his sister-in-law, and did not make much mention of his relationship with his daughter.    CURRENT CONCERNS Current Concerns  Post-Acute Placement   Other Concerns:   none.    SOCIAL WORK ASSESSMENT / PLAN CSW met with pt at bedside. CSW defined CSW role at [facility]. Pt was agreeable to speaking with CSW. CSW explained SNF placement process, and how currently pt is being recommended by PT/OT for home health services. Pt stated that he understood that his insurance may deny his SNF placement, but he would like for CSW to attempt for SNF placement due to no one being able to help care for him at home. Pt noted that he lives with his sister-in-law but stated that she would not help with dressing changes. Pt also mentioned his girlfriend, [name], but stated that she is not around everyday to help him. CSW informed pt that CSW would assist with possible SNF placement in [facility]. CSW and RNCM spoke to pt together. Pt informed CSW and RNCM that he would not feel comfortable going home and would rather wait in hospital for placement. CSW to continue to follow and assist with discharge planning needs.    CSW faxing clinicals to [facility]. Hand off to be left for weekend CSW to continue to follow.   Assessment/plan status:   Psychosocial Support/Ongoing Assessment of Needs Other assessment/ plan:   none.   Information/referral to community resources:   [facility] SNF placement bed offers.    PATIENTS/FAMILYS RESPONSE TO PLAN OF CARE: Pt was understandings and agreeable to CSW plan of care.      Darlyn Chamber, LCSWA Clinical Social Worker [phone]

## 2013-08-14 NOTE — Progress Notes (Addendum)
Clinical Child psychotherapist (CSW) gave bed offers to patient and encouraged him to review the offers with his family. Patient verbalized his understanding. CSW put FL2 on shadow chart for MD to sign.   Hendricks Milo Weekend CSW [phone]  4:03 pm 08/14/13- CSW spoke with patient's sister-in-law Lynden Ang [phone] who reported that their first choice facility is [facility] the second choice is [facility], third choice is [facility]. Lynden Ang also requested MD to contact her. CSW write a physician sticky note regarding this.   Jetta Lout, LCSWA Weekend CSW [phone]

## 2013-08-14 NOTE — Progress Notes (Signed)
Overall stable. No new problems. Patient complains of back pain but no new radicular symptoms or other problems. He remains afebrile. Vitals are stable. Urine output good.  Awake and alert. Oriented and appropriate. Motor and sensory function stable. The dressing dry.  Overall stable. Awaiting skilled nursing facility placement for further convalescence and wound care.

## 2013-08-14 NOTE — Progress Notes (Signed)
Pt was asleep at 0000 hr after taking medication for headache and did not ambulate along the hallway.  We will attempt same in AM.

## 2013-08-14 NOTE — Progress Notes (Signed)
Physical Therapy Treatment Patient Details Name: Ricardo LEVERICH Sr. MRN: [id number] DOB: 11/20/1945 Today's Date: 08/14/2013 Time: 0865-7846 PT Time Calculation (min): 23 min  PT Assessment / Plan / Recommendation  History of Present Illness 67 y.o. s/p  Redo decompressive laminectomy decompressing the L1 and L2 roots  (2 levels) ,   PT Comments   Pt demo's unsafe mobility with impulsive movements/decisions putting him at increased risk of falling. Will need 24 hour supervision/assist at home for safety.   Follow Up Recommendations  Home health PT;Supervision/Assistance - 24 hour     Equipment Recommendations  Rolling walker with 5" wheels    Frequency Min 5X/week   Progress towards PT Goals Progress towards PT goals: Progressing toward goals  Plan Current plan remains appropriate    Precautions / Restrictions Precautions Precautions: Fall;Back Precaution Comments: Reviewed back precautions with pt- pt able to recall 2/3 prior to review and at end of session. Required Braces or Orthoses: Spinal Brace Spinal Brace: Thoracolumbosacral orthotic;Applied in sitting position (total assist to don/doff brace at edge of bed)    Mobility  Bed Mobility Rolling Right: 5: Supervision Left Sidelying to Sit: 5: Supervision;HOB flat Sitting - Scoot to Edge of Bed: 5: Supervision Sit to Sidelying Left: 4: Min guard;HOB flat Details for Bed Mobility Assistance: still needs cues for safety with moblity due to impulsivity  Transfers Sit to Stand: 5: Supervision;From bed;With upper extremity assist Stand to Sit: 4: Min assist;To bed;Other (comment) (see comments above) Details for Transfer Assistance: demo'd safe technique with standing from bed, multimodal cues for safety with getting back into bed. Ambulation/Gait Ambulation/Gait Assistance: 5: Supervision;4: Min guard Ambulation Distance (Feet): 400 Feet Assistive device: Rolling walker Ambulation/Gait Assistance Details: min cues for  posture and walker position with gait in hallway. needed more cues for obstacle/barrier negotiation as gait progressed. Gait Pattern: Step-through pattern;Trunk flexed Gait velocity: increased at first, with cues to slow down for safety pt was able to slow down.     PT Goals (current goals can now be found in the care plan section) Acute Rehab PT Goals Patient Stated Goal: not stated PT Goal Formulation: With patient Time For Goal Achievement: 08/17/13 Potential to Achieve Goals: Good  Visit Information  Last PT Received On: 08/14/13 Assistance Needed: +1 History of Present Illness: 67 y.o. s/p  Redo decompressive laminectomy decompressing the L1 and L2 roots  (2 levels) ,    Subjective Data  Patient Stated Goal: not stated   Cognition  Cognition Arousal/Alertness: Awake/alert Behavior During Therapy: Impulsive Overall Cognitive Status: No family/caregiver present to determine baseline cognitive functioning Area of Impairment: Memory;Safety/judgement;Awareness;Problem solving Current Attention Level: Sustained Memory: Decreased recall of precautions Following Commands: Follows one step commands consistently Safety/Judgement: Decreased awareness of safety Awareness: Emergent;Anticipatory Problem Solving: Requires verbal cues;Difficulty sequencing General Comments: impulsivity increases as pt's fatique increased leading pt to make "unsafe choices", such as running into object with walker due to not slowing down, paying attention and/or leaving walker all together a couple of times at end of session. Due to hurry to get back in bed pt bent forward and "lunged' at bed while turing himeself so to be able to sit on bed vs landing forward on bed, all while he was reaching to get his newspaper. Educated pt on safety and discussed how these where "unsafe choices"  and he should slow down so not to fall.  End of Session PT - End of Session Equipment Utilized During  Treatment: Gait belt;Back brace Activity Tolerance: Patient tolerated treatment well Patient left: in bed;with family/visitor present;with call bell/phone within reach;with nursing/sitter in room Nurse Communication: Mobility status;Patient requests pain meds   GP     [name] 08/14/2013, 2:20 PM  [name], PTA Office- [phone]

## 2013-08-15 NOTE — Progress Notes (Signed)
Overall stable. No new issues. Wound care progressing well.  Afebrile. Vital stable. Motor 5/5. Chest and abdomen benign. Wound granulating well.  Continue wound care. Plan skilled nursing facility discharge when bed available.

## 2013-08-16 NOTE — Progress Notes (Signed)
Physical Therapy Treatment Patient Details Name: Ricardo PRIEST Sr. MRN: [id number] DOB: [birth year] Today's Date: 08/16/2013 Time: 4098-1191 PT Time Calculation (min): 17 min  PT Assessment / Plan / Recommendation  History of Present Illness 67 y.o. s/p  Redo decompressive laminectomy decompressing the L1 and L2 roots  (2 levels) ,   PT Comments   Pt moving well, but continue to require cues for back precautions and safety.  Discussed with pt and sister-in-law home vs SNF at D/C and pt stating he feels good to go home, but sister-in-law is concerned about his safety.  Will continue to follow.    Follow Up Recommendations  Home health PT;Supervision/Assistance - 24 hour     Does the patient have the potential to tolerate intense rehabilitation     Barriers to Discharge        Equipment Recommendations  Rolling walker with 5" wheels    Recommendations for Other Services    Frequency Min 5X/week   Progress towards PT Goals Progress towards PT goals: Progressing toward goals  Plan Current plan remains appropriate    Precautions / Restrictions Precautions Precautions: Fall;Back Precaution Comments: Reviewed back precautions with pt- pt able to recall 2/3 prior to review and at end of session. Required Braces or Orthoses: Spinal Brace Spinal Brace: Thoracolumbosacral orthotic;Applied in sitting position Restrictions Weight Bearing Restrictions: No   Pertinent Vitals/Pain Indicates back is sore, but "not much".      Mobility  Bed Mobility Bed Mobility: Rolling Left;Left Sidelying to Sit;Sitting - Scoot to Edge of Bed Rolling Left: 5: Supervision Left Sidelying to Sit: 5: Supervision Sitting - Scoot to Edge of Bed: 5: Supervision Details for Bed Mobility Assistance: cues for back precautions Transfers Transfers: Sit to Stand;Stand to Sit Sit to Stand: 5: Supervision;With upper extremity assist;From bed Stand to Sit: 5: Supervision;With upper extremity assist;To  chair/3-in-1 Details for Transfer Assistance: demos good use of UEs, only cued to control descent to chair.   Ambulation/Gait Ambulation/Gait Assistance: 5: Supervision Ambulation Distance (Feet): 500 Feet Assistive device: Rolling walker Ambulation/Gait Assistance Details: cues for upright posture and positioning within RW and back precautions with turns.   Gait Pattern: Step-through pattern;Trunk flexed Stairs: No Wheelchair Mobility Wheelchair Mobility: No    Exercises     PT Diagnosis:    PT Problem List:   PT Treatment Interventions:     PT Goals (current goals can now be found in the care plan section) Acute Rehab PT Goals Patient Stated Goal: not stated Time For Goal Achievement: 08/17/13 Potential to Achieve Goals: Good  Visit Information  Last PT Received On: 08/16/13 Assistance Needed: +1 History of Present Illness: 67 y.o. s/p  Redo decompressive laminectomy decompressing the L1 and L2 roots  (2 levels) ,    Subjective Data  Patient Stated Goal: not stated   Cognition  Cognition Arousal/Alertness: Awake/alert Behavior During Therapy: Impulsive Overall Cognitive Status: Impaired/Different from baseline Area of Impairment: Memory;Safety/judgement;Problem solving;Awareness Memory: Decreased recall of precautions Safety/Judgement: Decreased awareness of safety Awareness: Anticipatory Problem Solving: Difficulty sequencing;Requires verbal cues    Balance     End of Session PT - End of Session Equipment Utilized During Treatment: Gait belt;Back brace Activity Tolerance: Patient tolerated treatment well Patient left: in chair;with call bell/phone within reach;with family/visitor present Nurse Communication: Mobility status   GP     Sunny Schlein, [place] [phone] 08/16/2013, 3:01 PM

## 2013-08-16 NOTE — Clinical Social Work Note (Addendum)
CSW received handoff from weekend CSW regarding SNF preferred choices per pt and pt's family. CSW faxed updated clinicals to [facility] on 08/16/2013. Currently awaiting insurance approval for SNF placement.  12:14pm, CSW placed call to [facility] to check on SNF authorization. CSW currently awaiting a call back. CSW will continue to update as more information is received.  Darlyn Chamber, LCSWA Clinical Social Worker [phone]

## 2013-08-17 MED ORDER — PROMETHAZINE HCL 12.5 MG PO TABS: 25.0000 mg | ORAL_TABLET | ORAL | Status: DC | PRN

## 2013-08-17 MED ORDER — OXYCODONE HCL 5 MG PO TABS: 5.0000 mg | ORAL_TABLET | ORAL | Status: DC | PRN

## 2013-08-17 MED ORDER — ZOLPIDEM TARTRATE 5 MG PO TABS: 5.0000 mg | ORAL_TABLET | Freq: Every evening | ORAL | Status: DC | PRN

## 2013-08-17 NOTE — Discharge Summary (Signed)
Physician Discharge Summary  Patient ID: Ricardo MUSCARELLA Sr. MRN: [id number] DOB/AGE: May 22, 1946 67 y.o.  Admit date: 08/09/2013 Discharge date: 08/17/2013  Admission Diagnoses: L1-L2 spondylosis and stenosis status post decompression arthrodesis lumbar spine  Discharge Diagnoses: L1-L2 spondylosis and stenosis status post decompression arthrodesis lumbar spine Active Problems:   * No active hospital problems. *   Discharged Condition: fair  Hospital Course: Patient was admitted to undergo surgical decompression at L1-L2 is at previous surgery down below this in his lumbar spine. He tolerated surgery well. He is to be transferred to skilled nursing facility.  Consults: None  Significant Diagnostic Studies: None  Treatments: None  Discharge Exam: Blood pressure 107/50, pulse 61, temperature 98.5 F (36.9 C), temperature source Oral, resp. rate 18, height 5' 10.87" (1.8 m), weight 90.5 kg (199 lb 8.3 oz), SpO2 99.00%. Incision is clean and dry motor function appears intact in both lower extremities.  Disposition: SNF  Discharge Orders   Future Orders Complete By Expires   Diet - low sodium heart healthy  As directed    Increase activity slowly  As directed        Medication List         aspirin EC 325 MG tablet  Take 1 tablet (325 mg total) by mouth daily.     ezetimibe 10 MG tablet  Commonly known as:  ZETIA  Take 1 tablet (10 mg total) by mouth daily.     nitroGLYCERIN 0.4 MG SL tablet  Commonly known as:  NITROSTAT  Place 1 tablet (0.4 mg total) under the tongue as directed. 1 tablet under tongue at onset of chest pain; you may repeat every  Minutes for up to 3 doses     oxyCODONE 5 MG immediate release tablet  Commonly known as:  Oxy IR/ROXICODONE  Take 1-2 tablets (5-10 mg total) by mouth every 4 (four) hours as needed for moderate pain or severe pain.     promethazine 12.5 MG tablet  Commonly known as:  PHENERGAN  Take 2 tablets (25 mg total) by mouth  every 4 (four) hours as needed for nausea or vomiting.     sertraline 50 MG tablet  Commonly known as:  ZOLOFT  Take 1 tablet (50 mg total) by mouth daily.     traZODone 150 MG tablet  Commonly known as:  DESYREL  Take 2 tablets (300 mg total) by mouth at bedtime. 2 by mouth at bedtime     zolpidem 5 MG tablet  Commonly known as:  AMBIEN  Take 1 tablet (5 mg total) by mouth at bedtime as needed for sleep.         SignedStefani Dama 08/17/2013, 6:53 PM

## 2013-08-17 NOTE — Clinical Social Work Note (Signed)
Due to shift change, CSW has reported to RN on completion of pt discharge to [facility]. Discharge packet is on shadow chart and requiring the following information once completed: discharge summary, AVS, and signed FL2 (also on shadow chart). RN to call admissions coordinator, Reyne Dumas [phone], once discharge packet is complete to confirm that pt is still able to discharge to facility (depending on the time in the evening). PTAR form is complete and attached to discharge summary. RN to call PTAR once discharge packet is complete (if approved by Reyne Dumas)   PTAR: [phone]  Darlyn Chamber, [facility] Clinical Social Worker [phone]

## 2013-08-17 NOTE — Progress Notes (Signed)
Occupational Therapy Treatment Patient Details Name: Ricardo OVERFIELD Sr. MRN: [id number] DOB: 08/13/1946 Today's Date: 08/17/2013 Time: 4098-1191 OT Time Calculation (min): 31 min  OT Assessment / Plan / Recommendation  History of present illness 67 y.o. s/p  Redo decompressive laminectomy decompressing the L1 and L2 roots  (2 levels) ,   OT comments  Pt practiced simulated shower transfer, grooming, and provided education to both pt and sister in law. Sister in law practiced donning/doffing back brace. Pt progressing towards goals.  Pt planning to d/c to SNF.   Follow Up Recommendations  SNF;Supervision/Assistance - 24 hour    Barriers to Discharge       Equipment Recommendations  3 in 1 bedside comode    Recommendations for Other Services    Frequency Min 2X/week   Progress towards OT Goals Progress towards OT goals: Progressing toward goals  Plan Discharge plan needs to be updated    Precautions / Restrictions Precautions Precautions: Fall;Back Precaution Comments: Pt able to recall 2/3 precautions Required Braces or Orthoses: Spinal Brace Spinal Brace: Thoracolumbosacral orthotic;Applied in sitting position Restrictions Weight Bearing Restrictions: No   Pertinent Vitals/Pain Pain 8.5/10. Repositioned.     ADL  Grooming: Supervision/safety;Teeth care;Wash/dry hands (supplies at sink) Where Assessed - Grooming: Supported standing Upper Body Dressing: Other (comment);Minimal assistance (family member practiced this) Where Assessed - Upper Body Dressing: Unsupported sitting Toilet Transfer: Min guard Toilet Transfer Method: Sit to stand;Other (comment) (also standing at toilet) Toilet Transfer Equipment: Comfort height toilet Toileting - Clothing Manipulation and Hygiene: Min guard Where Assessed - Engineer, mining and Hygiene: Standing Tub/Shower Transfer: Simulated;Minimal assistance Tub/Shower Transfer Method: Land: Walk in shower;Other (comment) (3 in 1) Equipment Used: Back brace;Gait belt;Rolling walker;Reacher;Long-handled sponge;Long-handled shoe horn;Sock aid Transfers/Ambulation Related to ADLs: Min guard/Supervision for ambulation and Min guard for transfers. ADL Comments: Sister in law present in session. She practiced donning/doffing back brace. OT educated to have clothing under brace and also to not sleep in brace. Pt unable to don socks sitting EOB while maintaining precautions, but pt/family stated he donned pants laying in bed and OT said that is a way he can get dressed but be sure he is maintaining precautions. Sister in law seemed interested in AE so OT educated on AE for LB ADLs. Educated on toilet aid for hygiene. Pt practiced simulated shower transfer as well with sister present and talked with her about shower chair-either using the 3 in 1 or another chair but be sure chair does not slide. Pt wanted to ambulate in hallway-cues for upright posture and also for walker safety during session.  Several cues for precautions during session.  Educated sister in law to have rugs picked up and also educated on use of bag on walker.     OT Diagnosis:    OT Problem List:   OT Treatment Interventions:     OT Goals(current goals can now be found in the care plan section) Acute Rehab OT Goals Patient Stated Goal: go home OT Goal Formulation: With patient Time For Goal Achievement: 08/16/13 Potential to Achieve Goals: Good ADL Goals Pt Will Perform Grooming: with modified independence;standing Pt Will Transfer to Toilet: with modified independence;ambulating (3 in 1 over commode) Pt Will Perform Toileting - Clothing Manipulation and hygiene: with modified independence;sit to/from stand Pt Will Perform Tub/Shower Transfer: Tub transfer;with supervision;ambulating;rolling walker;Shower transfer (tub equipment tbd) Additional ADL Goal #1: Pt will independently verbalize 3/3 back precautions.   Additional ADL Goal #2: Caregiver will  be independent in donning/doffing back brace.  Additional ADL Goal #3: Pt will perform bed mobility at Mod I level as precursor for ADLs.   Visit Information  Last OT Received On: 08/17/13 Assistance Needed: +1 History of Present Illness: 67 y.o. s/p  Redo decompressive laminectomy decompressing the L1 and L2 roots  (2 levels) ,    Subjective Data      Prior Functioning       Cognition  Cognition Arousal/Alertness: Awake/alert Behavior During Therapy: Impulsive Overall Cognitive Status: Impaired/Different from baseline Area of Impairment: Memory;Safety/judgement Memory: Decreased recall of precautions Safety/Judgement: Decreased awareness of safety    Mobility  Bed Mobility Bed Mobility: Rolling Left;Left Sidelying to Sit;Sit to Sidelying Left;Rolling Right;Sit to Supine Rolling Right: 5: Supervision Rolling Left: 5: Supervision Left Sidelying to Sit: 5: Supervision Sit to Supine: 5: Supervision Details for Bed Mobility Assistance: Cues to perform log roll technique and not just go supine <> sit.  Transfers Transfers: Sit to Stand;Stand to Sit Sit to Stand: 4: Min guard;From bed;From chair/3-in-1 Stand to Sit: 4: Min guard;To bed;To chair/3-in-1 Details for Transfer Assistance: cues for hand placement    Exercises      Balance     End of Session OT - End of Session Equipment Utilized During Treatment: Gait belt;Rolling walker;Back brace Activity Tolerance: Patient tolerated treatment well Patient left: in bed;with call bell/phone within reach;with bed alarm set;with family/visitor present  GO     Earlie Raveling OTR/L [phone] 08/17/2013, 12:35 PM

## 2013-08-17 NOTE — Progress Notes (Signed)
PT Cancellation Note  Patient Details Name: Ricardo NAZAR Sr. MRN: [id number] DOB: 11/07/1945   Cancelled Treatment:    Reason Eval/Treat Not Completed: Patient declined, no reason specified.  Pt states too early in the morning.  Will try another time.     Sunny Schlein, [place] [phone] 08/17/2013, 10:38 AM

## 2013-08-17 NOTE — Plan of Care (Signed)
Nurse called Dr Venetia Maxon who is covering for Dr Phoebe Perch to let him know that pt has been approve for SNF. Needs d/c instructions and sign Fl2. Dr Venetia Maxon said to call Dr Jordan Likes who is following the pt. Nurse called office and message left with Dr Lindalou Hose nurse. Waited an hour and no one from office called nurse back. Nurse called again and Dr Lindalou Hose nurse said that Dr Jordan Likes signed off to Dr Danielle Dess. Dr Danielle Dess has been notified to come and see pt and complete paperwork for DC. She does not know what time. Updated pt and waiting for Dr Danielle Dess.

## 2013-08-17 NOTE — Clinical Social Work Note (Signed)
CSW received call from [facility] on 08/17/2013. According to [facility], pt has been approved for SNF placement at [facility]. CSW contacted [facility] regarding discharge to their facility once pt is medically stable for discharge. [facility] stated that they would be able to accept pt as early as today (08/17/2013). CSW to continue to follow and assist with discharge planning needs.  Transportation authorization: [id number]  Darlyn Chamber, [facility] Clinical Social Worker [phone]

## 2013-08-18 NOTE — Clinical Social Work Note (Signed)
CSW confirmed with [facility] pt able to discharge to their facility today 08/18/2013. CSW completed discharge packet and confirmed with RN pt ready for discharge. PTAR has been called.  Darlyn Chamber, LCSWA Clinical Social Worker [phone]

## 2013-08-23 ENCOUNTER — Other Ambulatory Visit: Payer: Self-pay | Admitting: *Deleted

## 2013-08-25 ENCOUNTER — Other Ambulatory Visit: Payer: Self-pay | Admitting: Internal Medicine

## 2013-08-26 NOTE — Telephone Encounter (Signed)
Trazodone refilled per protocol.

## 2013-08-27 ENCOUNTER — Telehealth: Payer: Self-pay | Admitting: Internal Medicine

## 2013-08-27 NOTE — Telephone Encounter (Signed)
Recommend to discuss with neurosurgery, he had surgery about 3 weeks ago

## 2013-08-27 NOTE — Telephone Encounter (Signed)
Sree called from [facility] to discuss Plan of Treatment for [employer].  Physical Therapy is one time a week for one week and then 2 times a week for two weeks. Also they wanted to know if it was okay to arrange for Shadrack to have Occupational Therapy?

## 2013-08-31 ENCOUNTER — Other Ambulatory Visit: Payer: Self-pay | Admitting: Internal Medicine

## 2013-09-01 ENCOUNTER — Ambulatory Visit: Payer: Medicare Other | Admitting: Internal Medicine

## 2013-09-01 NOTE — Telephone Encounter (Signed)
Trazodone refilled per protocol.

## 2013-10-24 ENCOUNTER — Telehealth: Payer: Self-pay | Admitting: Internal Medicine

## 2013-10-24 ENCOUNTER — Other Ambulatory Visit: Payer: Self-pay | Admitting: Internal Medicine

## 2013-10-24 DIAGNOSIS — R911 Solitary pulmonary nodule: Secondary | ICD-10-CM

## 2013-10-24 NOTE — Telephone Encounter (Signed)
Advise patient: He is due for a CT chest, I already entered the order He is also due for a routine office visit, please arrange

## 2013-10-25 NOTE — Telephone Encounter (Signed)
lmovm

## 2013-11-01 ENCOUNTER — Ambulatory Visit (INDEPENDENT_AMBULATORY_CARE_PROVIDER_SITE_OTHER)
Admission: RE | Admit: 2013-11-01 | Discharge: 2013-11-01 | Disposition: A | Discharge status: Home / Self Care | Payer: Medicare Other | Source: Ambulatory Visit | Attending: Internal Medicine | Admitting: Internal Medicine

## 2013-11-01 DIAGNOSIS — R911 Solitary pulmonary nodule: Secondary | ICD-10-CM

## 2013-11-22 ENCOUNTER — Other Ambulatory Visit: Payer: Self-pay | Admitting: Internal Medicine

## 2013-11-22 ENCOUNTER — Telehealth: Payer: Self-pay | Admitting: *Deleted

## 2013-11-22 NOTE — Telephone Encounter (Signed)
Received Home Health Certification and Plan of Care for patient. Billing sheet attached and placed in blue folder for Dr. Larose Kells. JG//CMA

## 2013-11-23 DIAGNOSIS — IMO0002 Reserved for concepts with insufficient information to code with codable children: Secondary | ICD-10-CM

## 2013-11-23 DIAGNOSIS — M545 Low back pain, unspecified: Secondary | ICD-10-CM

## 2013-11-23 DIAGNOSIS — E785 Hyperlipidemia, unspecified: Secondary | ICD-10-CM

## 2013-11-23 DIAGNOSIS — F17200 Nicotine dependence, unspecified, uncomplicated: Secondary | ICD-10-CM

## 2013-11-23 NOTE — Telephone Encounter (Signed)
Received signed forms back from Dr. Larose Kells. Forms faxed to [facility]. JG//CMA

## 2013-12-23 ENCOUNTER — Other Ambulatory Visit: Payer: Self-pay | Admitting: Internal Medicine

## 2014-01-23 ENCOUNTER — Other Ambulatory Visit: Payer: Self-pay | Admitting: Internal Medicine

## 2014-02-21 ENCOUNTER — Telehealth: Payer: Self-pay

## 2014-02-21 NOTE — Telephone Encounter (Signed)
Medication List and allergies:  Reviewed and updated  90 day supply/mail order: na Local prescriptions: [facility]  Immunizations due: declines PNA vaccine   A/P:   No changes to FH, PSH or Personal Hx Flu vaccine--07/2013 Tdap--2007 PNA--Declines Shingles--never had Chicken Pox CCS--11/2009 PSA--12/2011---0.77  To Discuss with Provider: Back

## 2014-02-22 ENCOUNTER — Ambulatory Visit (INDEPENDENT_AMBULATORY_CARE_PROVIDER_SITE_OTHER): Payer: Medicare Other | Admitting: Internal Medicine

## 2014-02-22 ENCOUNTER — Encounter: Payer: Self-pay | Admitting: Internal Medicine

## 2014-02-22 VITALS — BP 96/62 | HR 69 | Temp 97.8°F | Ht 71.0 in | Wt 201.0 lb

## 2014-02-22 DIAGNOSIS — M549 Dorsalgia, unspecified: Secondary | ICD-10-CM

## 2014-02-22 DIAGNOSIS — E785 Hyperlipidemia, unspecified: Secondary | ICD-10-CM

## 2014-02-22 DIAGNOSIS — J449 Chronic obstructive pulmonary disease, unspecified: Secondary | ICD-10-CM

## 2014-02-22 DIAGNOSIS — F329 Major depressive disorder, single episode, unspecified: Secondary | ICD-10-CM

## 2014-02-22 DIAGNOSIS — R7309 Other abnormal glucose: Secondary | ICD-10-CM

## 2014-02-22 DIAGNOSIS — I2510 Atherosclerotic heart disease of native coronary artery without angina pectoris: Secondary | ICD-10-CM

## 2014-02-22 DIAGNOSIS — R911 Solitary pulmonary nodule: Secondary | ICD-10-CM

## 2014-02-22 DIAGNOSIS — Z125 Encounter for screening for malignant neoplasm of prostate: Secondary | ICD-10-CM

## 2014-02-22 DIAGNOSIS — Z0000 Encounter for general adult medical examination without abnormal findings: Secondary | ICD-10-CM

## 2014-02-22 DIAGNOSIS — R7303 Prediabetes: Secondary | ICD-10-CM

## 2014-02-22 DIAGNOSIS — F3289 Other specified depressive episodes: Secondary | ICD-10-CM

## 2014-02-22 NOTE — Assessment & Plan Note (Signed)
Had surgery several months ago but the pain has not improved, patient actually reports is worse. Offered referral to pain management but he will think about it

## 2014-02-22 NOTE — Assessment & Plan Note (Signed)
oligo symptomatic, last PFTs 2010, Rx PFTs

## 2014-02-22 NOTE — Assessment & Plan Note (Addendum)
Td 2008 Shingles shot-- Rx provided last year, declines to take  pneumonia shot 12-2009  prevnar -- strongly declined   cscope 11-09, hyperplastic polyps  Dr Oletta Lamas, next ? Will contact GI  Addendum: Pt was told that he was due for one in 2012 but never had it done also pt was dismissed from their office. Plan-- refer to GI  DRE -- declined Check a PSA    FH Lung Ca, still smoking, counseled against it, discussed role of CT chest for screening (probably out of pocket expense) in the future (had a CT chest 10-2013) also rec to see the dentist d/t smoking

## 2014-02-22 NOTE — Assessment & Plan Note (Signed)
10-2013: CT stable, no further CTs

## 2014-02-22 NOTE — Assessment & Plan Note (Signed)
Asymptomatic Stress test 06-2013 prior to surgery: poor  quality but apparently unchanged from previous. Plan: Try to control cardiovascular risk factors Continue with aspirin 325 Recommend not to take additional aspirin

## 2014-02-22 NOTE — Assessment & Plan Note (Signed)
Labs

## 2014-02-22 NOTE — Progress Notes (Signed)
Subjective:    Patient ID: Ricardo Palmer Sr., male    DOB: [birth year], 68 y.o.   MRN: [id number]  DOS:  02/22/2014 Type of  Visit:   Here for Medicare AWV:  1. Risk factors based on Past M, S, F history: reviewed  2. Physical Activities: unable d/t back pain  3. Depression/mood: Neg screening  4. Hearing: admits to diff hearing, no problems noted during normal conversation, offered referral (declined)  5. ADL's: Totally independent, no driving x 6 months 6. Fall Risk:  see instructions  7. home Safety: does feel safe at home  8. Height, weight, &visual acuity: see VS, uses glasses, has an appointment for next week to see the eye doctor  9. Counseling: provided  10. Labs ordered based on risk factors: if needed  11. Referral Coordination: if needed  12. Care Plan, see assessment and plan  13. Cognitive Assessment: motor skills limited by back pain and cognitive skills wnl   In addition, today we discussed the following: CAD- Asymptomatic, takes aspirin 325 mg daily but also two Excedrin  a day. High cholesterol, off meds, reason? History of COPD, oligo symptomatic, see review of systems below BP slightly low today, he is asymptomatic, denies dizziness or weakness. Depression, good compliance with medication, symptoms well-controlled   ROS  Denies chest pain or difficulty breathing. No lower extremity edema No nausea, vomiting, diarrhea or blood in the stools. Has occasional cough,  Denies daily morning cough. Very seldom has clear sputum, no hemoptysis. No wheezing  Denies dysuria, gross hematuria, difficulty urinating or urinary frequency   Past Medical History  Diagnosis Date  . Depression   . COPD, mild      PFTs 12/2008  . Hyperlipidemia   . Colon polyp     three 2-45mm polyps in descending colon, medium sized lipoma in sigmoid colon,  multiple 284mm in recto-sigmoid colon  . NSTEMI (non-ST elevated myocardial infarction) 02-2011  . Coronary atherosclerosis of native  coronary artery     Multivessel, stent LAD 1998, thrombectomy 2006, PTCA 2011, NSTEMI 6/12 managed medically;  LexiScan Myoview (10/14):  Difficult study, inferior scar with peri-infarct ischemia, inferior HK, EF 45%; low risk  . Full dentures   . Wears glasses   . 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 BDZHGDJM(426.8)     Past Surgical History  Procedure Laterality Date  . Appendectomy    . Tonsillectomy    . Lumbar fusion      x5 Dr. Corrin Parker  . 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 Coronary stent placement  1998    placed in the LAD  . Thrombectomy  02/2005    acute non ST segment elevation myocardial infarction  . Coronary stent placement  02/2005    PCI/drug-eluting stent implantation mid-LAD  . Balloon angioplasty, artery  04/2010    sp PTCA  . Nasal fracture surgery      x2  . Inguinal hernia repair      x3  . Back surgery  1986  . Mass excision Left 03/09/2013    Procedure: EXCISION left chest wall MASS;  Surgeon: Marcello Moores A. Cornett, MD;  Location: [place];  Service: General;  Laterality: Left;  . Lumbar laminectomy  08/09/2013     L1  L2    Dr Luiz Ochoa    History   Social  History  . Marital Status: Widowed    Spouse Name: N/A    Number of Children: 3  . Years of Education: N/A   Occupational History  . retired from the [employer]     Social History Main Topics  . Smoking status: Light Tobacco Smoker -- 1.00 packs/day for 25 years    Types: Cigarettes  . Smokeless tobacco: Never Used     Comment: smokes sometimes, uses the E cigarrete  . Alcohol Use: Yes     Comment: Rarely   . Drug Use: No     Comment:    . Sexual Activity: Yes   Other Topics Concern  . Not on file   Social History Narrative   Lost 1 son   sister in law lives w/ him      Family History  Problem Relation Age of Onset  . Heart attack Mother   . Lung cancer Father      +smoker  . Diabetes Other     GM, aunt, other memebers  . Prostate cancer Neg Hx        . Colon cancer Neg Hx         Medication List       This list is accurate as of: 02/22/14 11:59 PM.  Always use your most recent med list.               aspirin EC 325 MG tablet  Take 1 tablet (325 mg total) by mouth daily.     nitroGLYCERIN 0.4 MG SL tablet  Commonly known as:  NITROSTAT  Place 1 tablet (0.4 mg total) under the tongue as directed. 1 tablet under tongue at onset of chest pain; you may repeat every  Minutes for up to 3 doses     sertraline 50 MG tablet  Commonly known as:  ZOLOFT  Take 1 tablet daily.     traZODone 150 MG tablet  Commonly known as:  DESYREL  TAKE 2 TABLETS (300 MG TOTAL) BY MOUTH AT BEDTIME.           Objective:   Physical Exam BP 96/62  Pulse 69  Temp(Src) 97.8 F (36.6 C)  Ht 5\' 11"  (1.803 m)  Wt 201 lb (91.173 kg)  BMI 28.05 kg/m2  SpO2 100%  General -- alert, well-developed, NAD.  Neck --no thyromegaly , normal carotid pulse  HEENT-- Not pale.  Lungs -- normal respiratory effort, no intercostal retractions, no accessory muscle use, and normal breath sounds.  Heart-- normal rate, regular rhythm, no murmur.  Abdomen-- Not distended, good bowel sounds,soft, non-tender. DRE-- declined  Extremities-- no pretibial edema bilaterally  Neurologic--  alert & oriented X3. Speech normal, gait limited by back pain Psych-- Cognition and judgment appear intact. Cooperative with normal attention span and concentration. No anxious or depressed appearing.     Assessment & Plan:

## 2014-02-22 NOTE — Assessment & Plan Note (Signed)
See previous entry, he was supposed to continue Crestor but he's not taking it, reason?Marland Kitchen Not taking Zetia. Plan: labs

## 2014-02-22 NOTE — Assessment & Plan Note (Signed)
Symptoms well controlled 

## 2014-02-22 NOTE — Patient Instructions (Addendum)
Get your blood work before you leave   Next visit is for routine check up in 1 months , fasting   Fall Prevention and [place] cause injuries and can affect all age groups. It is possible to use preventive measures to significantly decrease the likelihood of falls. There are many simple measures which can make your home safer and prevent falls. OUTDOORS  Repair cracks and edges of walkways and driveways.  Remove high doorway thresholds.  Trim shrubbery on the main path into your home.  Have good outside lighting.  Clear walkways of tools, rocks, debris, and clutter.  Check that handrails are not broken and are securely fastened. Both sides of steps should have handrails.  Have leaves, snow, and ice cleared regularly.  Use sand or salt on walkways during winter months.  In the garage, clean up grease or oil spills. BATHROOM  Install night lights.  Install grab bars by the toilet and in the tub and shower.  Use non-skid mats or decals in the tub or shower.  Place a plastic non-slip stool in the shower to sit on, if needed.  Keep floors dry and clean up all water on the floor immediately.  Remove soap buildup in the tub or shower on a regular basis.  Secure bath mats with non-slip, double-sided rug tape.  Remove throw rugs and tripping hazards from the floors. BEDROOMS  Install night lights.  Make sure a bedside light is easy to reach.  Do not use oversized bedding.  Keep a telephone by your bedside.  Have a firm chair with side arms to use for getting dressed.  Remove throw rugs and tripping hazards from the floor. KITCHEN  Keep handles on pots and pans turned toward the center of the stove. Use back burners when possible.  Clean up spills quickly and allow time for drying.  Avoid walking on wet floors.  Avoid hot utensils and knives.  Position shelves so they are not too high or low.  Place commonly used objects within easy reach.  If  necessary, use a sturdy step stool with a grab bar when reaching.  Keep electrical cables out of the way.  Do not use floor polish or wax that makes floors slippery. If you must use wax, use non-skid floor wax.  Remove throw rugs and tripping hazards from the floor. STAIRWAYS  Never leave objects on stairs.  Place handrails on both sides of stairways and use them. Fix any loose handrails. Make sure handrails on both sides of the stairways are as long as the stairs.  Check carpeting to make sure it is firmly attached along stairs. Make repairs to worn or loose carpet promptly.  Avoid placing throw rugs at the top or bottom of stairways, or properly secure the rug with carpet tape to prevent slippage. Get rid of throw rugs, if possible.  Have an electrician put in a light switch at the top and bottom of the stairs. OTHER FALL PREVENTION TIPS  Wear low-heel or rubber-soled shoes that are supportive and fit well. Wear closed toe shoes.  When using a stepladder, make sure it is fully opened and both spreaders are firmly locked. Do not climb a closed stepladder.  Add color or contrast paint or tape to grab bars and handrails in your home. Place contrasting color strips on first and last steps.  Learn and use mobility aids as needed. Install an electrical emergency response system.  Turn on lights to avoid dark areas. Replace light  bulbs that burn out immediately. Get light switches that glow.  Arrange furniture to create clear pathways. Keep furniture in the same place.  Firmly attach carpet with non-skid or double-sided tape.  Eliminate uneven floor surfaces.  Select a carpet pattern that does not visually hide the edge of steps.  Be aware of all pets. OTHER HOME SAFETY TIPS  Set the water temperature for 120 F (48.8 C).  Keep emergency numbers on or near the telephone.  Keep smoke detectors on every level of the home and near sleeping areas. Document Released: 08/30/2002  Document Revised: 03/10/2012 Document Reviewed: 11/29/2011 [facility] Patient Information 2014 [place].

## 2014-02-23 LAB — COMPREHENSIVE METABOLIC PANEL
ALBUMIN: 3.7 g/dL (ref 3.5–5.2)
ALK PHOS: 87 U/L (ref 39–117)
ALT: 13 U/L (ref 0–53)
AST: 17 U/L (ref 0–37)
BUN: 15 mg/dL (ref 6–23)
CHLORIDE: 106 meq/L (ref 96–112)
CO2: 27 meq/L (ref 19–32)
Calcium: 9.2 mg/dL (ref 8.4–10.5)
Creatinine, Ser: 1 mg/dL (ref 0.4–1.5)
GFR: 79.87 mL/min (ref >60.00)
Glucose, Bld: 86 mg/dL (ref 70–99)
Potassium: 4.8 mEq/L (ref 3.5–5.1)
Sodium: 140 mEq/L (ref 135–145)
TOTAL PROTEIN: 6.5 g/dL (ref 6.0–8.3)
Total Bilirubin: 0.7 mg/dL (ref 0.2–1.2)

## 2014-02-23 LAB — LIPID PANEL
Cholesterol: 223 mg/dL — ABNORMAL HIGH (ref 0–200)
HDL: 37.6 mg/dL — ABNORMAL LOW (ref >39.00)
LDL CALC: 148 mg/dL — AB (ref 0–99)
NonHDL: 185.4
Total CHOL/HDL Ratio: 6
Triglycerides: 188 mg/dL — ABNORMAL HIGH (ref 0.0–149.0)
VLDL: 37.6 mg/dL (ref 0.0–40.0)

## 2014-02-23 LAB — HEMOGLOBIN A1C: HEMOGLOBIN A1C: 5.7 % (ref 4.6–6.5)

## 2014-02-23 LAB — PSA: PSA: 0.6 ng/mL (ref 0.10–4.00)

## 2014-02-24 NOTE — Addendum Note (Signed)
Addended by: Kathlene November E on: 02/24/2014 05:00 PM   Modules accepted: Orders

## 2014-02-25 ENCOUNTER — Encounter: Payer: Self-pay | Admitting: *Deleted

## 2014-02-25 ENCOUNTER — Telehealth: Payer: Self-pay | Admitting: *Deleted

## 2014-02-25 DIAGNOSIS — E785 Hyperlipidemia, unspecified: Secondary | ICD-10-CM

## 2014-02-25 MED ORDER — ROSUVASTATIN CALCIUM 40 MG PO TABS: 40.0000 mg | ORAL_TABLET | Freq: Every day | ORAL | Status: DC

## 2014-02-25 NOTE — Telephone Encounter (Signed)
Spoke with patient and made aware of recent labs. Patient advised that cholesterol is elevated and because of that he needs to go back on crestor. [facility] [address]

## 2014-02-25 NOTE — Progress Notes (Signed)
See phone note

## 2014-02-25 NOTE — Telephone Encounter (Signed)
Message copied by Chilton Greathouse on Fri Feb 25, 2014 10:18 AM ------      Message from: Kathlene November E      Created: Thu Feb 24, 2014  8:40 PM       Advise patient:      Labs okay except for cholesterol, needs to go back on Crestor 40 mg one by mouth each bedtime, call a new prescription ------

## 2014-03-01 ENCOUNTER — Encounter: Payer: Self-pay | Admitting: Internal Medicine

## 2014-03-01 ENCOUNTER — Telehealth: Payer: Self-pay | Admitting: Internal Medicine

## 2014-03-01 NOTE — Telephone Encounter (Signed)
Dr. Carlean Purl please review colonoscopy report from [facility] in the chart from 2009 to determine if it is appropriate to schedule a procedure now.  Dr. Larose Kells is referring for a direct colonoscopy.  The report is under procedure from 07/2008, but I will place a paper copy of the path in your office.  Looks like only hyperplastic polyps from the descending colon and rectosigmoid

## 2014-03-01 NOTE — Telephone Encounter (Signed)
The hyperplastic polyps in the descending colon are serrated lesions and above the sigmoid it is reasonable to do a 5 yr f/u - apparently he also had polyps prior to 2009 colonoscopy  1) OK to schedule due to hx polyps 2) See if we can get any earlier colonoscopy/path reports (did not see in Epic when looking for hospital notes)

## 2014-03-02 NOTE — Telephone Encounter (Signed)
L/m for the patient to c/b and schedule with Dr. Carlean Purl.

## 2014-03-03 ENCOUNTER — Ambulatory Visit (INDEPENDENT_AMBULATORY_CARE_PROVIDER_SITE_OTHER): Payer: Medicare Other | Admitting: Internal Medicine

## 2014-03-03 DIAGNOSIS — J449 Chronic obstructive pulmonary disease, unspecified: Secondary | ICD-10-CM

## 2014-03-03 LAB — PULMONARY FUNCTION TEST
DL/VA % pred: 59 %
DL/VA: 2.74 ml/min/mmHg/L
DLCO UNC: 20.69 ml/min/mmHg
DLCO unc % pred: 63 %
FEF 25-75 PRE: 2.09 L/s
FEF 25-75 Post: 3.47 L/sec
FEF2575-%CHANGE-POST: 66 %
FEF2575-%PRED-POST: 134 %
FEF2575-%Pred-Pre: 81 %
FEV1-%CHANGE-POST: 11 %
FEV1-%PRED-POST: 106 %
FEV1-%Pred-Pre: 96 %
FEV1-PRE: 3.19 L
FEV1-Post: 3.55 L
FEV1FVC-%Change-Post: 1 %
FEV1FVC-%PRED-PRE: 96 %
FEV6-%Change-Post: 9 %
FEV6-%PRED-POST: 113 %
FEV6-%Pred-Pre: 102 %
FEV6-Post: 4.8 L
FEV6-Pre: 4.37 L
FEV6FVC-%Change-Post: 0 %
FEV6FVC-%Pred-Post: 103 %
FEV6FVC-%Pred-Pre: 103 %
FVC-%Change-Post: 10 %
FVC-%Pred-Post: 109 %
FVC-%Pred-Pre: 99 %
FVC-POST: 4.91 L
FVC-Pre: 4.46 L
POST FEV1/FVC RATIO: 72 %
Post FEV6/FVC ratio: 98 %
Pre FEV1/FVC ratio: 72 %
Pre FEV6/FVC Ratio: 98 %

## 2014-03-03 NOTE — Progress Notes (Signed)
PFT done today. 

## 2014-03-20 ENCOUNTER — Other Ambulatory Visit: Payer: Self-pay | Admitting: Internal Medicine

## 2014-04-14 ENCOUNTER — Ambulatory Visit (INDEPENDENT_AMBULATORY_CARE_PROVIDER_SITE_OTHER): Payer: Medicare Other | Admitting: Internal Medicine

## 2014-04-14 ENCOUNTER — Encounter: Payer: Self-pay | Admitting: Internal Medicine

## 2014-04-14 VITALS — BP 99/65 | HR 77 | Temp 97.8°F | Wt 205.0 lb

## 2014-04-14 DIAGNOSIS — M549 Dorsalgia, unspecified: Secondary | ICD-10-CM

## 2014-04-14 DIAGNOSIS — E785 Hyperlipidemia, unspecified: Secondary | ICD-10-CM

## 2014-04-14 DIAGNOSIS — J449 Chronic obstructive pulmonary disease, unspecified: Secondary | ICD-10-CM

## 2014-04-14 LAB — LIPID PANEL
CHOL/HDL RATIO: 2
Cholesterol: 95 mg/dL (ref 0–200)
HDL: 40.1 mg/dL (ref >39.00)
LDL Cholesterol: 32 mg/dL (ref 0–99)
NONHDL: 54.9
Triglycerides: 114 mg/dL (ref 0.0–149.0)
VLDL: 22.8 mg/dL (ref 0.0–40.0)

## 2014-04-14 LAB — ALT: ALT: 11 U/L (ref 0–53)

## 2014-04-14 LAB — AST: AST: 20 U/L (ref 0–37)

## 2014-04-14 NOTE — Assessment & Plan Note (Signed)
PFTs 02/2014 w/ minimal dz

## 2014-04-14 NOTE — Progress Notes (Signed)
Subjective:    Patient ID: Ricardo Palmer Sr., male    DOB: December 15, 1945, 68 y.o.   MRN: [id number]  DOS:  04/14/2014 Type of visit - description: f/u History: High cholesterol, cholesterol was check at the time of her last visit, was elevated but pt was not taking Crestor. Reports generalized aches and pains but they're going on since before statins and  not getting worse. Also complained of lower extremity weakness with ambulation, denies claudication per se, this is going on for at least 2 or 3 months. Request a parking permit  ROS Denies bladder or bowel incontinence, no falls or lower  extremity discoordination, occasionally feels numbness at the left foot, that is going on since before  back surgery. Back pain at baseline, ongoing issue  Past Medical History  Diagnosis Date  . Depression   . COPD, mild      PFTs 12/2008, PFTs 6-15 minimal dz  . Hyperlipidemia   . Colon polyp     three 2-32mm polyps in descending colon, medium sized lipoma in sigmoid colon,  multiple 278mm in recto-sigmoid colon  . NSTEMI (non-ST elevated myocardial infarction) 02-2011  . Coronary atherosclerosis of native coronary artery     Multivessel, stent LAD 1998, thrombectomy 2006, PTCA 2011, NSTEMI 6/12 managed medically;  LexiScan Myoview (10/14):  Difficult study, inferior scar with peri-infarct ischemia, inferior HK, EF 45%; low risk  . Full dentures   . Wears glasses   . 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 GYIRSWNI(627.0)     Past Surgical History  Procedure Laterality Date  . Appendectomy    . Tonsillectomy    . Lumbar fusion      x5 Dr. Corrin Parker  . 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 Coronary stent placement  1998    placed in the LAD  . Thrombectomy  02/2005    acute non ST segment elevation myocardial infarction  . Coronary stent placement  02/2005    PCI/drug-eluting stent implantation  mid-LAD  . Balloon angioplasty, artery  04/2010    sp PTCA  . Nasal fracture surgery      x2  . Inguinal hernia repair      x3  . Back surgery  1986  . Mass excision Left 03/09/2013    Procedure: EXCISION left chest wall MASS;  Surgeon: Marcello Moores A. Cornett, MD;  Location: [facility];  Service: General;  Laterality: Left;  . Lumbar laminectomy  08/09/2013     L1  L2    Dr Luiz Ochoa    History   Social History  . Marital Status: Widowed    Spouse Name: N/A    Number of Children: 3  . Years of Education: N/A   Occupational History  . retired from the [employer]     Social History Main Topics  . Smoking status: Light Tobacco Smoker -- 1.00 packs/day for 25 years    Types: Cigarettes  . Smokeless tobacco: Never Used     Comment: smokes sometimes, uses the E cigarrete  . Alcohol Use: Yes     Comment: Rarely   . Drug Use: No     Comment:    . Sexual Activity: Yes   Other Topics Concern  . Not on file   Social History Narrative   Lost 1 son   sister in law lives w/  him         Medication List       This list is accurate as of: 04/14/14 11:59 PM.  Always use your most recent med list.               aspirin EC 325 MG tablet  Take 1 tablet (325 mg total) by mouth daily.     nitroGLYCERIN 0.4 MG SL tablet  Commonly known as:  NITROSTAT  Place 1 tablet (0.4 mg total) under the tongue as directed. 1 tablet under tongue at onset of chest pain; you may repeat every  Minutes for up to 3 doses     rosuvastatin 40 MG tablet  Commonly known as:  CRESTOR  Take 1 tablet (40 mg total) by mouth daily.     sertraline 50 MG tablet  Commonly known as:  ZOLOFT  TAKE 1 TABLET DAILY.     traZODone 150 MG tablet  Commonly known as:  DESYREL  TAKE 2 TABLETS (300 MG TOTAL) BY MOUTH AT BEDTIME.           Objective:   Physical Exam BP 99/65  Pulse 77  Temp(Src) 97.8 F (36.6 C)  Wt 205 lb (92.987 kg)  SpO2 93% General -- alert, well-developed, NAD.    Abdomen-- Not distended, good bowel sounds,soft, non-tender. No  Mass or bruit   Extremities-- no pretibial edema bilaterally ; normal femoral-pedalpulses B Neurologic--  alert & oriented X3. Speech normal, gait seems appropriate for age, strength symmetric and appropriate for age.  DTRs symmetric except for absent L Knee jerk Psych-- Cognition and judgment appear intact. Cooperative with normal attention span and concentration. No anxious or depressed appearing.        Assessment & Plan:

## 2014-04-14 NOTE — Progress Notes (Signed)
Pre visit review using our clinic review tool, if applicable. No additional management support is needed unless otherwise documented below in the visit note. 

## 2014-04-14 NOTE — Assessment & Plan Note (Signed)
Ongoing back pain now also with leg weakness, vascular exam normal, no claudication. Parking permit signed. Recommend physical therapy, patient declined

## 2014-04-14 NOTE — Patient Instructions (Signed)
Get your blood work before you leave     Next visit is for routine check up in 4 months  No need to come back fasting Please make an appointment

## 2014-04-14 NOTE — Assessment & Plan Note (Addendum)
Started Crestor, + aches and pains but at baseline, labs

## 2014-04-22 ENCOUNTER — Other Ambulatory Visit: Payer: Self-pay | Admitting: *Deleted

## 2014-04-22 MED ORDER — SERTRALINE HCL 50 MG PO TABS: ORAL_TABLET | ORAL | Status: DC

## 2014-05-20 ENCOUNTER — Telehealth: Payer: Self-pay

## 2014-05-20 NOTE — Telephone Encounter (Signed)
Received Crestor Assistance forms via fax.  Forms completed and signed by Dr. Larose Kells.  Forms faxed back to [phone].  Confirmation received.

## 2014-05-20 NOTE — Telephone Encounter (Signed)
[name]  [phone]  [name] called to check on a request for his rosuvastatin (CRESTOR) 40 MG tablet that [facility] was sending, with the completion of this form he will be able to get his Crestor for free from them

## 2014-05-20 NOTE — Telephone Encounter (Signed)
Have you seen a form for this Pt?

## 2014-05-25 ENCOUNTER — Other Ambulatory Visit: Payer: Self-pay | Admitting: Internal Medicine

## 2014-06-15 IMAGING — CT CT ANGIO CHEST
2 of 6 series · 18 of 46 positions shown · IV contrast (APPLIED)
Comparison: 03/23/2012

CLINICAL DATA: Chest pain.  Positive D-dimer.

CT ANGIOGRAPHY CHEST
TECHNIQUE: Multidetector CT imaging of the chest using the
standard protocol during bolus administration of intravenous
contrast. Multiplanar reconstructed images including MIPs were
obtained and reviewed to evaluate the vascular anatomy.
Contrast: 100mL OMNIPAQUE IOHEXOL 350 MG/ML SOLN

[Series 6: pulm embolism 1.0 b25f thin · axial · 0.67mm/px · z∈[-281,-28]mm · 15 of 279 slices shown]
[im 13/279  lung]
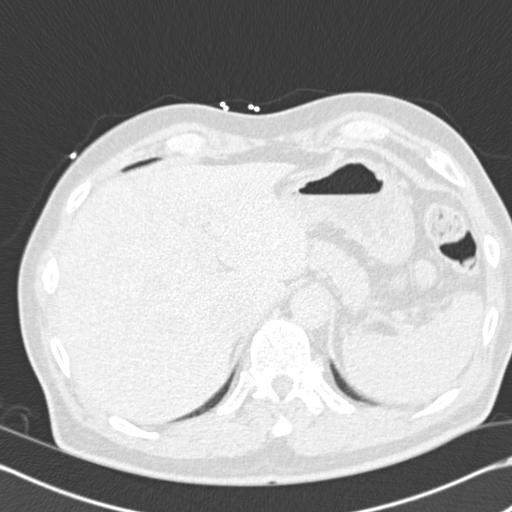
[im 37/279  soft-tissue]
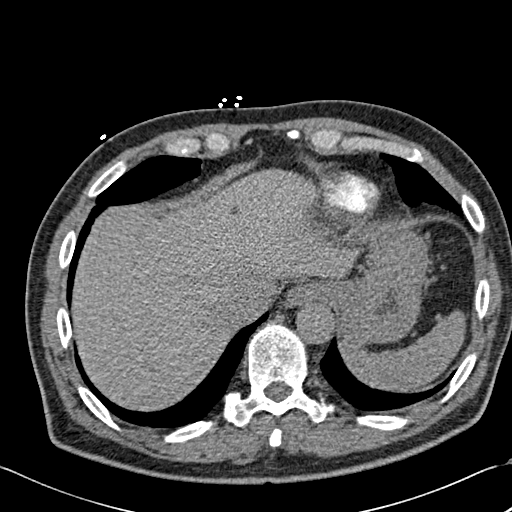
[im 49/279  lung]
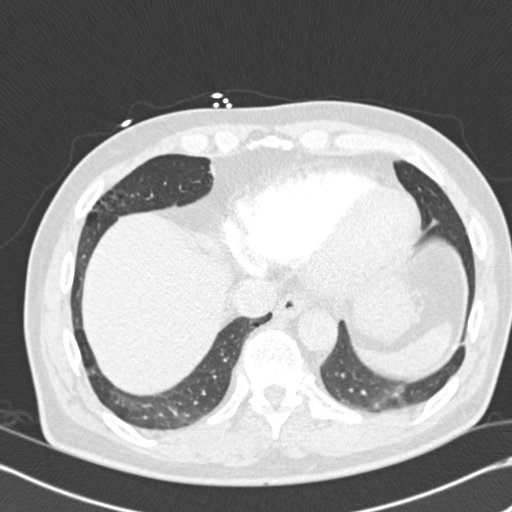
[im 73/279  soft-tissue]
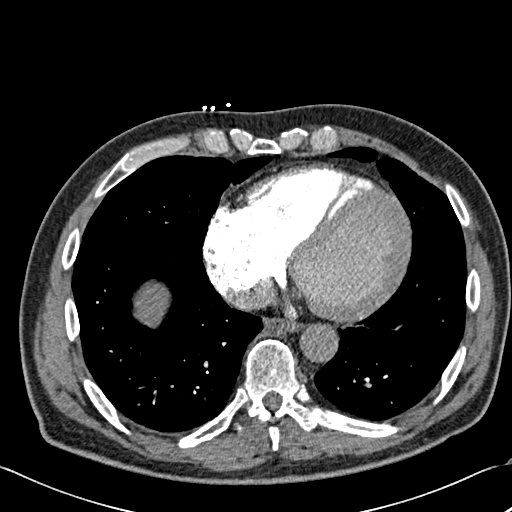
[im 85/279  lung]
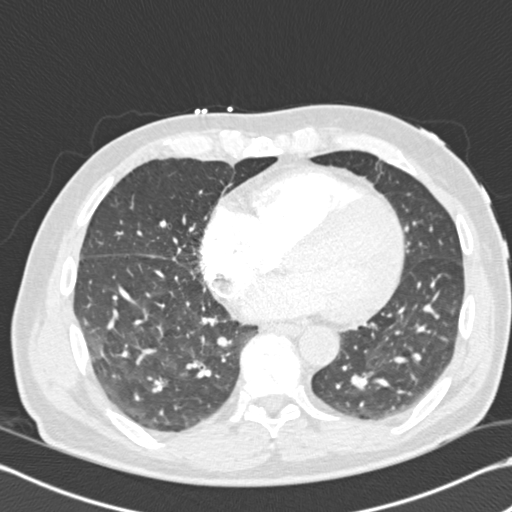
[im 109/279  soft-tissue]
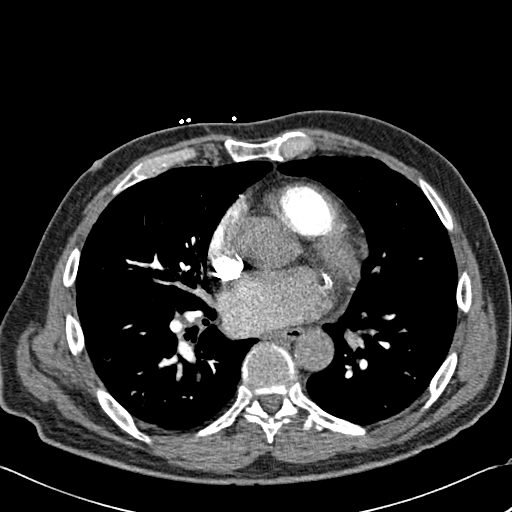
[im 121/279  lung]
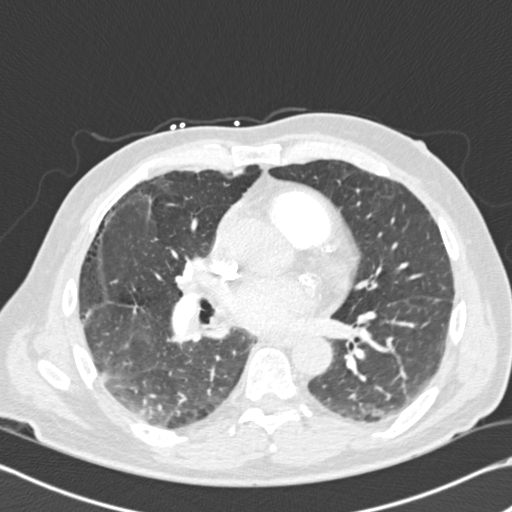
[im 146/279  soft-tissue]
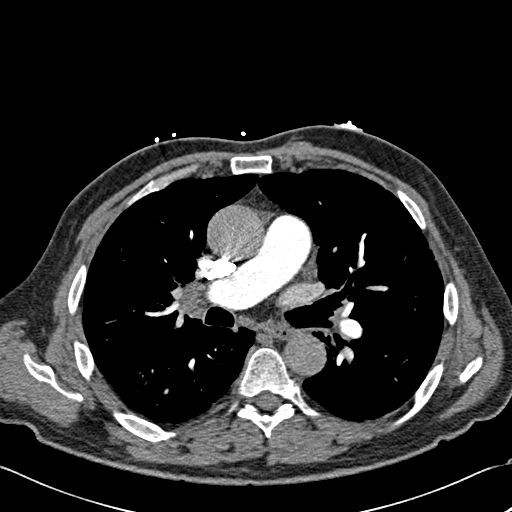
[im 158/279  lung]
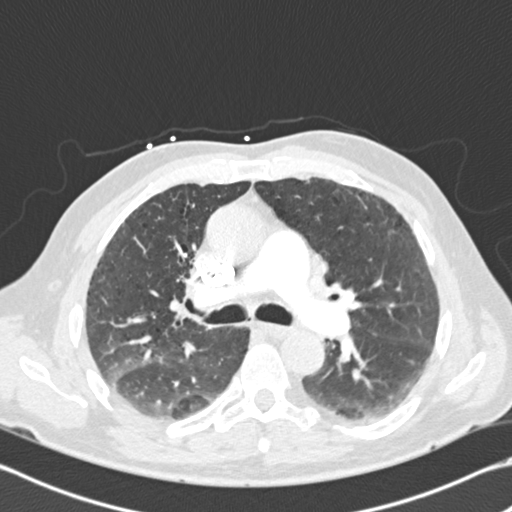
[im 170/279  soft-tissue]
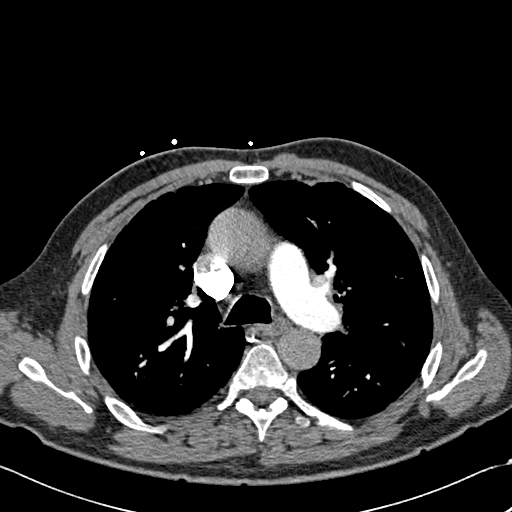
[im 194/279  lung]
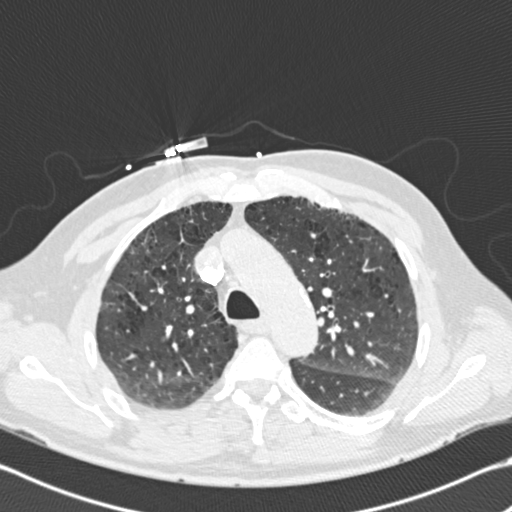
[im 206/279  soft-tissue]
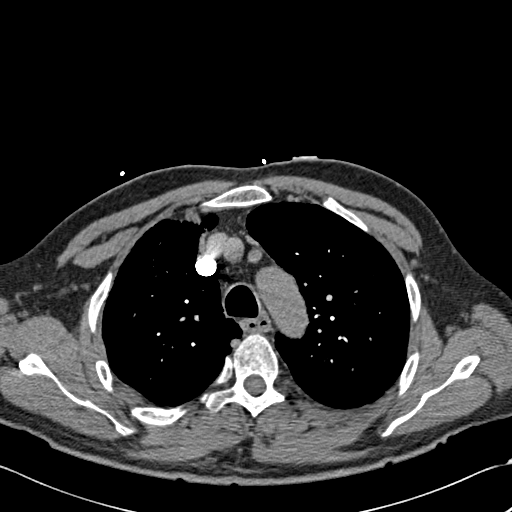
[im 230/279  lung]
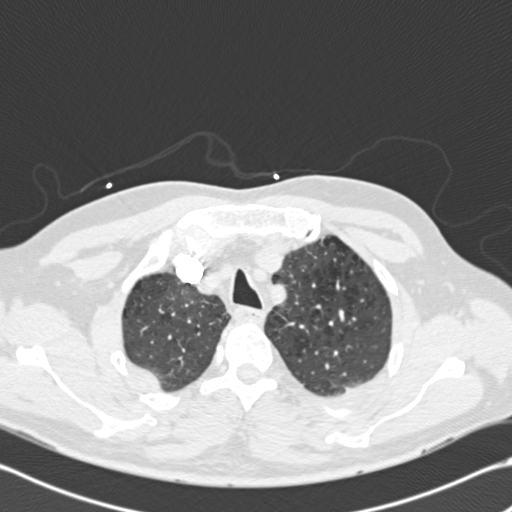
[im 242/279  soft-tissue]
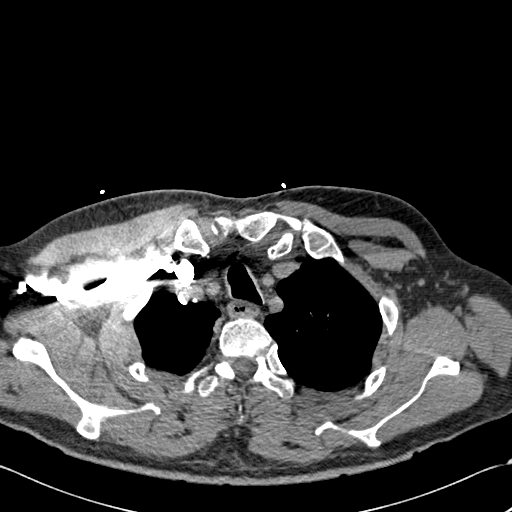
[im 266/279  lung]
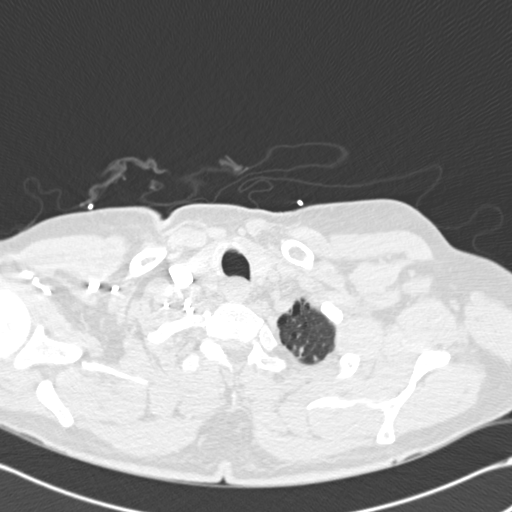

[Series 604: cor · coronal · 0.67mm/px · 3 of 96 slices shown]
[im 24/96  soft-tissue]
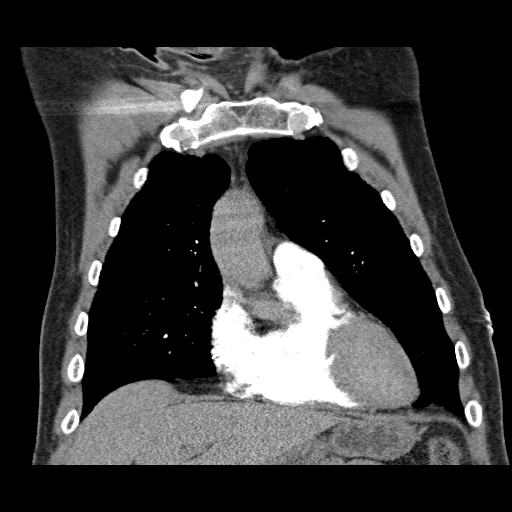
[im 48/96  soft-tissue]
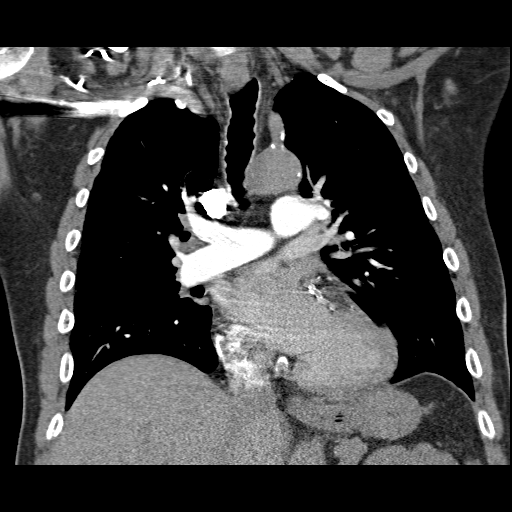
[im 72/96  soft-tissue]
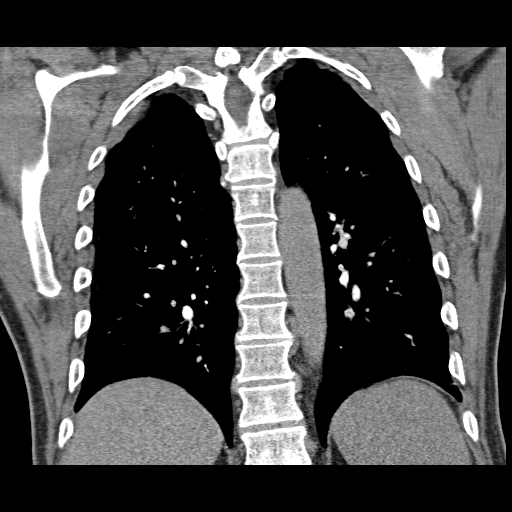

[18 of 46 positions shown; findings below may reference images not displayed]

FINDINGS: No axillary or supraclavicular lymph nodes identified.
There is no mediastinal or hilar adenopathy identified.  There is
no pericardial or pleural effusions identified. Calcified
atherosclerotic disease affects the LAD, left circumflex and RCA
coronary arteries.

The pulmonary arteries appear patent.  There is no evidence for an
acute pulmonary embolus.  Moderate changes of emphysema identified.

There is a pulmonary nodule within the right upper lobe that
measures 4.8 mm, image 57.  Review of the visualized osseous
structures is negative.

Imaging through the upper abdomen shows a low density structure
within the dome of the liver.  Too small to characterize but likely
small cyst.
IMPRESSION: 1.  No evidence for acute pulmonary embolus or other acute
cardiopulmonary abnormalities.
2.  There is atherosclerosis of the thoracic aorta, the great
vessels of the mediastinum and the coronary arteries, including
calcified atherosclerotic plaque in the RCA, LAD and left
circumflex coronary arteries.

Atherosclerosis, including three-vessel coronary artery disease.
Please note that although the presence of coronary artery calcium
documents the presence of coronary artery disease, the severity of
this disease and any potential stenosis cannot be assessed on this
non-gated CT examination.  Assessment for potential risk factor
modification, dietary therapy or pharmacologic therapy may be
warranted, if clinically indicated.
3.  Pulmonary nodule in the right upper lobe is new from previous
exam measuring 4.8 mm. If the patient is at high risk for
bronchogenic carcinoma, follow-up chest CT at 6-12 months is
recommended.  If the patient is at low risk for bronchogenic
carcinoma, follow-up chest CT at 12 months is recommended.  This
recommendation follows the consensus statement: Guidelines for
Management of Small Pulmonary Nodules Detected on CT Scans: A
Statement from the [HOSPITAL] as published in Radiology

## 2014-07-06 NOTE — Telephone Encounter (Signed)
Form completed and faxed on (05/20/14).//AB/CMA

## 2014-08-16 ENCOUNTER — Ambulatory Visit: Payer: Medicare Other | Admitting: Internal Medicine

## 2014-08-30 ENCOUNTER — Other Ambulatory Visit: Payer: Self-pay | Admitting: Internal Medicine

## 2014-09-26 ENCOUNTER — Other Ambulatory Visit: Payer: Self-pay | Admitting: Internal Medicine

## 2014-09-26 NOTE — Telephone Encounter (Signed)
Okay to refill #60 and one refill He is overdue for a checkup, please be sure that is arrange

## 2014-09-26 NOTE — Telephone Encounter (Signed)
Pt is requesting refill on Trazodone.  Last OV: 04/14/2014 Last Fill: 08/31/2014 # [phone]  Please advise.

## 2014-09-29 DIAGNOSIS — Z6828 Body mass index (BMI) 28.0-28.9, adult: Secondary | ICD-10-CM | POA: Diagnosis not present

## 2014-09-29 DIAGNOSIS — M5442 Lumbago with sciatica, left side: Secondary | ICD-10-CM | POA: Diagnosis not present

## 2014-10-04 ENCOUNTER — Other Ambulatory Visit: Payer: Self-pay

## 2014-10-04 ENCOUNTER — Telehealth: Payer: Self-pay | Admitting: Internal Medicine

## 2014-10-04 DIAGNOSIS — E785 Hyperlipidemia, unspecified: Secondary | ICD-10-CM

## 2014-10-04 MED ORDER — ROSUVASTATIN CALCIUM 40 MG PO TABS: 40.0000 mg | ORAL_TABLET | Freq: Every day | ORAL | Status: DC

## 2014-10-04 MED ORDER — SERTRALINE HCL 50 MG PO TABS: ORAL_TABLET | ORAL | Status: DC

## 2014-10-04 NOTE — Telephone Encounter (Signed)
Caller name: [name] Relation to pt: self Call back number:  [phone] Pharmacy: [facility]  Reason for call:   Patient states that he now has [place] and would like crestor, trazodone, and sertraline.

## 2014-10-04 NOTE — Telephone Encounter (Signed)
Trazodone refilled on 09/26/2014, will send 30 day supply of Sertraline and Crestor until he is seen. He is overdue for F/U appt.

## 2014-10-04 NOTE — Telephone Encounter (Signed)
Informed patient of this.  °

## 2014-10-10 DIAGNOSIS — M5442 Lumbago with sciatica, left side: Secondary | ICD-10-CM | POA: Diagnosis not present

## 2014-10-12 ENCOUNTER — Encounter: Payer: Self-pay | Admitting: Internal Medicine

## 2014-10-12 ENCOUNTER — Ambulatory Visit (INDEPENDENT_AMBULATORY_CARE_PROVIDER_SITE_OTHER): Payer: Commercial Managed Care - HMO | Admitting: Internal Medicine

## 2014-10-12 VITALS — BP 108/68 | HR 75 | Temp 97.6°F | Ht 71.0 in | Wt 202.5 lb

## 2014-10-12 DIAGNOSIS — J449 Chronic obstructive pulmonary disease, unspecified: Secondary | ICD-10-CM

## 2014-10-12 DIAGNOSIS — E785 Hyperlipidemia, unspecified: Secondary | ICD-10-CM

## 2014-10-12 DIAGNOSIS — F32A Depression, unspecified: Secondary | ICD-10-CM

## 2014-10-12 DIAGNOSIS — F329 Major depressive disorder, single episode, unspecified: Secondary | ICD-10-CM

## 2014-10-12 MED ORDER — ROSUVASTATIN CALCIUM 40 MG PO TABS: 40.0000 mg | ORAL_TABLET | Freq: Every day | ORAL | Status: DC

## 2014-10-12 MED ORDER — TRAZODONE HCL 150 MG PO TABS: ORAL_TABLET | ORAL | Status: DC

## 2014-10-12 MED ORDER — SERTRALINE HCL 50 MG PO TABS: ORAL_TABLET | ORAL | Status: DC

## 2014-10-12 NOTE — Assessment & Plan Note (Signed)
Symptoms well controlled on Zoloft, and Desyrel. Refills provided

## 2014-10-12 NOTE — Progress Notes (Signed)
Subjective:    Patient ID: Ricardo Palmer Sr., male    DOB: 1946/07/12, 69 y.o.   MRN: [id number]  DOS:  10/12/2014 Type of visit - description : Routine office visit Interval history: History of CAD, asymptomatic. History of back pain, symptoms slightly worse lately, to have a MRI in 3 days. Needs multiple refills. Had a flu shot already Labs reviewed, not due for any blood work today    ROS Denies chest pain or difficulty breathing No nausea, vomiting, diarrhea. Occasionally lower extremity weakness, thinks related to back pain. Still smoking  Past Medical History  Diagnosis Date  . Depression   . COPD, mild      PFTs 12/2008, PFTs 6-15 minimal dz  . Hyperlipidemia   . Colon polyp     three 2-16mm polyps in descending colon, medium sized lipoma in sigmoid colon,  multiple 255mm in recto-sigmoid colon  . NSTEMI (non-ST elevated myocardial infarction) 02-2011  . Coronary atherosclerosis of native coronary artery     Multivessel, stent LAD 1998, thrombectomy 2006, PTCA 2011, NSTEMI 6/12 managed medically;  LexiScan Myoview (10/14):  Difficult study, inferior scar with peri-infarct ischemia, inferior HK, EF 45%; low risk  . Full dentures   . Wears glasses   . 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 HTDSKAJG(811.5)     Past Surgical History  Procedure Laterality Date  . Appendectomy    . Tonsillectomy    . Lumbar fusion      x5 Dr. Corrin Parker  . 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 Coronary stent placement  1998    placed in the LAD  . Thrombectomy  02/2005    acute non ST segment elevation myocardial infarction  . Coronary stent placement  02/2005    PCI/drug-eluting stent implantation mid-LAD  . Balloon angioplasty, artery  04/2010    sp PTCA  . Nasal fracture surgery      x2  . Inguinal hernia repair      x3  . Back surgery  1986  . Mass excision Left 03/09/2013   Procedure: EXCISION left chest wall MASS;  Surgeon: Marcello Moores A. Cornett, MD;  Location: [facility];  Service: General;  Laterality: Left;  . Lumbar laminectomy  08/09/2013     L1  L2    Dr Luiz Ochoa    History   Social History  . Marital Status: Widowed    Spouse Name: N/A    Number of Children: 3  . Years of Education: N/A   Occupational History  . retired from the rail [employer]     Social History Main Topics  . Smoking status: Light Tobacco Smoker -- 1.00 packs/day for 25 years    Types: Cigarettes  . Smokeless tobacco: Never Used     Comment: smokes sometimes, uses the E cigarrete  . Alcohol Use: Yes     Comment: Rarely   . Drug Use: No     Comment:    . Sexual Activity: Yes   Other Topics Concern  . Not on file   Social History Narrative   Lost 1 son, sister in law lives w/ him , will be by himself at the end of [id number]        Medication List       This list is accurate as of: 10/12/14 11:59 PM.  Always use your  most recent med list.               aspirin EC 325 MG tablet  Take 1 tablet (325 mg total) by mouth daily.     nitroGLYCERIN 0.4 MG SL tablet  Commonly known as:  NITROSTAT  Place 1 tablet (0.4 mg total) under the tongue as directed. 1 tablet under tongue at onset of chest pain; you may repeat every  Minutes for up to 3 doses     rosuvastatin 40 MG tablet  Commonly known as:  CRESTOR  Take 1 tablet (40 mg total) by mouth daily.     sertraline 50 MG tablet  Commonly known as:  ZOLOFT  TAKE 1 TABLET DAILY.     traZODone 150 MG tablet  Commonly known as:  DESYREL  Take 2 tablets at bedtime.           Objective:   Physical Exam BP 108/68 mmHg  Pulse 75  Temp(Src) 97.6 F (36.4 C) (Oral)  Ht 5\' 11"  (1.803 m)  Wt 202 lb 8 oz (91.853 kg)  BMI 28.26 kg/m2  SpO2 98%  General -- alert, well-developed, NAD.   Lungs -- normal respiratory effort, no intercostal retractions, no accessory muscle use, and normal breath sounds.  Heart--  normal rate, regular rhythm, no murmur.   Extremities-- no pretibial edema bilaterally  Neurologic--  alert & oriented X3. Speech normal, gait appropriate for age, strength symmetric and appropriate for age.  Psych-- Cognition and judgment appear intact. Cooperative with normal attention span and concentration. No anxious or depressed appearing.     Assessment & Plan:

## 2014-10-12 NOTE — Patient Instructions (Signed)
Check the  blood pressure 2 or 3 times a month  Be sure your blood pressure is between 110/65 and  145/85.  if it is consistently higher or lower, let me know     Please come back to the office in 6 months  for a physical exam. Come back fasting

## 2014-10-12 NOTE — Assessment & Plan Note (Signed)
Still smoking, denies any symptoms, we again discussed smoking cessation, try Wellbutrin or Chantix? Patient not interested at this time

## 2014-10-12 NOTE — Progress Notes (Signed)
Pre visit review using our clinic review tool, if applicable. No additional management support is needed unless otherwise documented below in the visit note. 

## 2014-10-12 NOTE — Assessment & Plan Note (Signed)
Under excellent control with Crestor, refill provided.

## 2014-10-13 ENCOUNTER — Telehealth: Payer: Self-pay | Admitting: Internal Medicine

## 2014-10-13 NOTE — Telephone Encounter (Signed)
emmi mailed  °

## 2014-10-16 ENCOUNTER — Other Ambulatory Visit: Payer: Self-pay | Admitting: Internal Medicine

## 2014-10-19 DIAGNOSIS — M5442 Lumbago with sciatica, left side: Secondary | ICD-10-CM | POA: Diagnosis not present

## 2014-10-19 DIAGNOSIS — Z981 Arthrodesis status: Secondary | ICD-10-CM | POA: Diagnosis not present

## 2014-10-19 DIAGNOSIS — M5127 Other intervertebral disc displacement, lumbosacral region: Secondary | ICD-10-CM | POA: Diagnosis not present

## 2014-10-19 DIAGNOSIS — M5137 Other intervertebral disc degeneration, lumbosacral region: Secondary | ICD-10-CM | POA: Diagnosis not present

## 2014-10-20 DIAGNOSIS — Z6828 Body mass index (BMI) 28.0-28.9, adult: Secondary | ICD-10-CM | POA: Diagnosis not present

## 2014-10-20 DIAGNOSIS — M5442 Lumbago with sciatica, left side: Secondary | ICD-10-CM | POA: Diagnosis not present

## 2014-10-21 DIAGNOSIS — M545 Low back pain: Secondary | ICD-10-CM | POA: Diagnosis not present

## 2014-10-21 DIAGNOSIS — M47816 Spondylosis without myelopathy or radiculopathy, lumbar region: Secondary | ICD-10-CM | POA: Diagnosis not present

## 2014-10-21 DIAGNOSIS — Z981 Arthrodesis status: Secondary | ICD-10-CM | POA: Diagnosis not present

## 2014-10-21 DIAGNOSIS — M5442 Lumbago with sciatica, left side: Secondary | ICD-10-CM | POA: Diagnosis not present

## 2014-12-28 ENCOUNTER — Other Ambulatory Visit: Payer: Self-pay

## 2015-02-07 ENCOUNTER — Telehealth: Payer: Self-pay | Admitting: Internal Medicine

## 2015-02-07 NOTE — Telephone Encounter (Signed)
Pre Visit letter sent  °

## 2015-02-09 ENCOUNTER — Other Ambulatory Visit: Payer: Self-pay

## 2015-02-28 ENCOUNTER — Encounter: Payer: Commercial Managed Care - HMO | Admitting: Internal Medicine

## 2015-03-07 DIAGNOSIS — M170 Bilateral primary osteoarthritis of knee: Secondary | ICD-10-CM | POA: Diagnosis not present

## 2015-03-07 DIAGNOSIS — M1712 Unilateral primary osteoarthritis, left knee: Secondary | ICD-10-CM | POA: Diagnosis not present

## 2015-03-07 DIAGNOSIS — M1711 Unilateral primary osteoarthritis, right knee: Secondary | ICD-10-CM | POA: Diagnosis not present

## 2015-03-07 DIAGNOSIS — M545 Low back pain: Secondary | ICD-10-CM | POA: Diagnosis not present

## 2015-05-12 ENCOUNTER — Telehealth: Payer: Self-pay | Admitting: *Deleted

## 2015-05-12 ENCOUNTER — Telehealth: Payer: Self-pay | Admitting: Internal Medicine

## 2015-05-12 NOTE — Telephone Encounter (Signed)
pre visit letter mailed 04/24/15

## 2015-05-12 NOTE — Telephone Encounter (Signed)
Unable to reach patient at time of Pre-Visit Call.  Left message for patient to return call when available.    

## 2015-05-15 ENCOUNTER — Encounter: Payer: Self-pay | Admitting: Internal Medicine

## 2015-05-15 ENCOUNTER — Ambulatory Visit (INDEPENDENT_AMBULATORY_CARE_PROVIDER_SITE_OTHER): Payer: Commercial Managed Care - HMO | Admitting: Internal Medicine

## 2015-05-15 VITALS — BP 108/68 | HR 73 | Temp 97.9°F | Ht 71.0 in | Wt 194.5 lb

## 2015-05-15 DIAGNOSIS — Z0000 Encounter for general adult medical examination without abnormal findings: Secondary | ICD-10-CM | POA: Diagnosis not present

## 2015-05-15 DIAGNOSIS — E785 Hyperlipidemia, unspecified: Secondary | ICD-10-CM

## 2015-05-15 DIAGNOSIS — F32A Depression, unspecified: Secondary | ICD-10-CM

## 2015-05-15 DIAGNOSIS — M549 Dorsalgia, unspecified: Secondary | ICD-10-CM

## 2015-05-15 DIAGNOSIS — R7309 Other abnormal glucose: Secondary | ICD-10-CM | POA: Diagnosis not present

## 2015-05-15 DIAGNOSIS — I2510 Atherosclerotic heart disease of native coronary artery without angina pectoris: Secondary | ICD-10-CM | POA: Diagnosis not present

## 2015-05-15 DIAGNOSIS — F17200 Nicotine dependence, unspecified, uncomplicated: Secondary | ICD-10-CM

## 2015-05-15 DIAGNOSIS — R7303 Prediabetes: Secondary | ICD-10-CM

## 2015-05-15 DIAGNOSIS — G8929 Other chronic pain: Secondary | ICD-10-CM

## 2015-05-15 DIAGNOSIS — F329 Major depressive disorder, single episode, unspecified: Secondary | ICD-10-CM

## 2015-05-15 LAB — CBC WITH DIFFERENTIAL/PLATELET
Basophils Absolute: 0 10*3/uL (ref 0.0–0.1)
Basophils Relative: 0.4 % (ref 0.0–3.0)
Eosinophils Absolute: 0.2 10*3/uL (ref 0.0–0.7)
Eosinophils Relative: 3.1 % (ref 0.0–5.0)
HCT: 47.4 % (ref 39.0–52.0)
HEMOGLOBIN: 16 g/dL (ref 13.0–17.0)
Lymphocytes Relative: 15.5 % (ref 12.0–46.0)
Lymphs Abs: 1.1 10*3/uL (ref 0.7–4.0)
MCHC: 33.9 g/dL (ref 30.0–36.0)
MCV: 94.1 fl (ref 78.0–100.0)
MONO ABS: 0.4 10*3/uL (ref 0.1–1.0)
Monocytes Relative: 5.7 % (ref 3.0–12.0)
NEUTROS PCT: 75.3 % (ref 43.0–77.0)
Neutro Abs: 5.2 10*3/uL (ref 1.4–7.7)
Platelets: 219 10*3/uL (ref 150.0–400.0)
RBC: 5.04 Mil/uL (ref 4.22–5.81)
RDW: 14.2 % (ref 11.5–15.5)
WBC: 6.9 10*3/uL (ref 4.0–10.5)

## 2015-05-15 LAB — COMPREHENSIVE METABOLIC PANEL
ALBUMIN: 3.9 g/dL (ref 3.5–5.2)
ALK PHOS: 91 U/L (ref 39–117)
ALT: 14 U/L (ref 0–53)
AST: 18 U/L (ref 0–37)
BUN: 13 mg/dL (ref 6–23)
CO2: 29 mEq/L (ref 19–32)
CREATININE: 0.94 mg/dL (ref 0.40–1.50)
Calcium: 9.2 mg/dL (ref 8.4–10.5)
Chloride: 105 mEq/L (ref 96–112)
GFR: 84.48 mL/min (ref >60.00)
Glucose, Bld: 107 mg/dL — ABNORMAL HIGH (ref 70–99)
Potassium: 4.2 mEq/L (ref 3.5–5.1)
SODIUM: 141 meq/L (ref 135–145)
TOTAL PROTEIN: 6.8 g/dL (ref 6.0–8.3)
Total Bilirubin: 0.4 mg/dL (ref 0.2–1.2)

## 2015-05-15 LAB — LIPID PANEL
Cholesterol: 98 mg/dL (ref 0–200)
HDL: 35.6 mg/dL — ABNORMAL LOW (ref >39.00)
LDL CALC: 49 mg/dL (ref 0–99)
NonHDL: 62.56
TRIGLYCERIDES: 70 mg/dL (ref 0.0–149.0)
Total CHOL/HDL Ratio: 3
VLDL: 14 mg/dL (ref 0.0–40.0)

## 2015-05-15 LAB — TSH: TSH: 0.75 u[IU]/mL (ref 0.35–4.50)

## 2015-05-15 LAB — HEMOGLOBIN A1C: Hgb A1c MFr Bld: 5.7 % (ref 4.6–6.5)

## 2015-05-15 MED ORDER — TRAZODONE HCL 150 MG PO TABS: ORAL_TABLET | ORAL | Status: DC

## 2015-05-15 MED ORDER — SERTRALINE HCL 50 MG PO TABS: 50.0000 mg | ORAL_TABLET | Freq: Every day | ORAL | Status: DC

## 2015-05-15 NOTE — Assessment & Plan Note (Signed)
Symptoms well-controlled, medications RF

## 2015-05-15 NOTE — Assessment & Plan Note (Addendum)
Td 2008 ;  pneumonia shot 12-2009 ; prevnar -- strongly declined ; Shingles shot--strongly declines ; rec a flu shot   [facility]: cscope 11-09, hyperplastic polyps  Dr Oletta Lamas,  he was due for one in 2012 but never had it done also pt was dismissed from their office. Was referred to GI 2015, did not go, not interested in pursuing further eval, risk of colon ca discussed Prostate  ca screening: has declined DREs before, PSAs stable, reassess 2017  Diet- exercise discussed rec to see a dentist q 6 months d/t tobacco abuse

## 2015-05-15 NOTE — Assessment & Plan Note (Signed)
On Crestor, good compliance of medication, labs

## 2015-05-15 NOTE — Assessment & Plan Note (Addendum)
Ongoing pain, see allergies, unable to take Tylenol or hydrocodone. Discussed tramadol, states that doesn't work well for him. declined referral to pain mngmt

## 2015-05-15 NOTE — Assessment & Plan Note (Addendum)
Active smoker with a family history of lung cancer, personal history of CAD. Counseled about risks of tobacco, lung cancer screening with a CT is now available for Medicare, patient aware, declined .

## 2015-05-15 NOTE — Patient Instructions (Signed)
Get your blood work before you leave    Please consider visit these websites for more information:  www.begintheconversation.org  theconversationproject.org    Fall Prevention and Home Safety Falls cause injuries and can affect all age groups. It is possible to use preventive measures to significantly decrease the likelihood of falls. There are many simple measures which can make your home safer and prevent falls. OUTDOORS  Repair cracks and edges of walkways and driveways.  Remove high doorway thresholds.  Trim shrubbery on the main path into your home.  Have good outside lighting.  Clear walkways of tools, rocks, debris, and clutter.  Check that handrails are not broken and are securely fastened. Both sides of steps should have handrails.  Have leaves, snow, and ice cleared regularly.  Use sand or salt on walkways during winter months.  In the garage, clean up grease or oil spills. BATHROOM  Install night lights.  Install grab bars by the toilet and in the tub and shower.  Use non-skid mats or decals in the tub or shower.  Place a plastic non-slip stool in the shower to sit on, if needed.  Keep floors dry and clean up all water on the floor immediately.  Remove soap buildup in the tub or shower on a regular basis.  Secure bath mats with non-slip, double-sided rug tape.  Remove throw rugs and tripping hazards from the floors. BEDROOMS  Install night lights.  Make sure a bedside light is easy to reach.  Do not use oversized bedding.  Keep a telephone by your bedside.  Have a firm chair with side arms to use for getting dressed.  Remove throw rugs and tripping hazards from the floor. KITCHEN  Keep handles on pots and pans turned toward the center of the stove. Use back burners when possible.  Clean up spills quickly and allow time for drying.  Avoid walking on wet floors.  Avoid hot utensils and knives.  Position shelves so they are not too high  or low.  Place commonly used objects within easy reach.  If necessary, use a sturdy step stool with a grab bar when reaching.  Keep electrical cables out of the way.  Do not use floor polish or wax that makes floors slippery. If you must use wax, use non-skid floor wax.  Remove throw rugs and tripping hazards from the floor. STAIRWAYS  Never leave objects on stairs.  Place handrails on both sides of stairways and use them. Fix any loose handrails. Make sure handrails on both sides of the stairways are as long as the stairs.  Check carpeting to make sure it is firmly attached along stairs. Make repairs to worn or loose carpet promptly.  Avoid placing throw rugs at the top or bottom of stairways, or properly secure the rug with carpet tape to prevent slippage. Get rid of throw rugs, if possible.  Have an electrician put in a light switch at the top and bottom of the stairs. OTHER FALL PREVENTION TIPS  Wear low-heel or rubber-soled shoes that are supportive and fit well. Wear closed toe shoes.  When using a stepladder, make sure it is fully opened and both spreaders are firmly locked. Do not climb a closed stepladder.  Add color or contrast paint or tape to grab bars and handrails in your home. Place contrasting color strips on first and last steps.  Learn and use mobility aids as needed. Install an electrical emergency response system.  Turn on lights to avoid dark areas.   Replace light bulbs that burn out immediately. Get light switches that glow.  Arrange furniture to create clear pathways. Keep furniture in the same place.  Firmly attach carpet with non-skid or double-sided tape.  Eliminate uneven floor surfaces.  Select a carpet pattern that does not visually hide the edge of steps.  Be aware of all pets. OTHER HOME SAFETY TIPS  Set the water temperature for 120 F (48.8 C).  Keep emergency numbers on or near the telephone.  Keep smoke detectors on every level of  the home and near sleeping areas. Document Released: 08/30/2002 Document Revised: 03/10/2012 Document Reviewed: 11/29/2011 ExitCare Patient Information 2015 [employer]. This information is not intended to replace advice given to you by your health care provider. Make sure you discuss any questions you have with your health care provider.   Preventive Care for Adults Ages 65 and over  Blood pressure check.** / Every 1 to 2 years.  Lipid and cholesterol check.**/ Every 5 years beginning at age 20.  Lung cancer screening. / Every year if you are aged 55-80 years and have a 30-pack-year history of smoking and currently smoke or have quit within the past 15 years. Yearly screening is stopped once you have quit smoking for at least 15 years or develop a health problem that would prevent you from having lung cancer treatment.  Fecal occult blood test (FOBT) of stool. / Every year beginning at age 50 and continuing until age 75. You may not have to do this test if you get a colonoscopy every 10 years.  Flexible sigmoidoscopy** or colonoscopy.** / Every 5 years for a flexible sigmoidoscopy or every 10 years for a colonoscopy beginning at age 50 and continuing until age 75.  Hepatitis C blood test.** / For all people born from 1945 through 1965 and any individual with known risks for hepatitis C.  Abdominal aortic aneurysm (AAA) screening.** / A one-time screening for ages 65 to 75 years who are current or former smokers.  Skin self-exam. / Monthly.  Influenza vaccine. / Every year.  Tetanus, diphtheria, and acellular pertussis (Tdap/Td) vaccine.** / 1 dose of Td every 10 years.  Varicella vaccine.** / Consult your health care provider.  Zoster vaccine.** / 1 dose for adults aged 60 years or older.  Pneumococcal 13-valent conjugate (PCV13) vaccine.** / Consult your health care provider.  Pneumococcal polysaccharide (PPSV23) vaccine.** / 1 dose for all adults aged 65 years and  older.  Meningococcal vaccine.** / Consult your health care provider.  Hepatitis A vaccine.** / Consult your health care provider.  Hepatitis B vaccine.** / Consult your health care provider.  Haemophilus influenzae type b (Hib) vaccine.** / Consult your health care provider. **Family history and personal history of risk and conditions may change your health care provider's recommendations. Document Released: 11/05/2001 Document Revised: 09/14/2013 Document Reviewed: 02/04/2011 ExitCare Patient Information 2015 [employer]. This information is not intended to replace advice given to you by your health care provider. Make sure you discuss any questions you have with your health care provider.   

## 2015-05-15 NOTE — Progress Notes (Signed)
Pre visit review using our clinic review tool, if applicable. No additional management support is needed unless otherwise documented below in the visit note. 

## 2015-05-15 NOTE — Assessment & Plan Note (Signed)
seems to be doing well, does not see cardiology regularly, somewhat reluctant to see other doctors. Plan: Continue with present care, plan is to control cardiovascular risk factors

## 2015-05-15 NOTE — Progress Notes (Signed)
Subjective:    Patient ID: Ricardo Palmer Sr., male    DOB: 10/25/1945, 69 y.o.   MRN: [id number]  DOS:  05/15/2015 Type of visit - description :  Here for Medicare AWV:  1. Risk factors based on Past M, S, F history: reviewed 2. Physical Activities:  Unable to exercise d/t back pain 3. Depression/mood: neg screening , on meds  4. Hearing:   admits to diff hearing, no problems noted during normal conversation, offered referral (declined)  5. ADL's: independent, drives  6. Fall Risk: no recent falls, prevention discussed , see AVS 7. home Safety: does feel safe at home  8. Height, weight, & visual acuity: see VS, due to see the eye doctor~ 08-2015 9. Counseling: provided 10. Labs ordered based on risk factors: if needed  11. Referral Coordination: if needed 12. Care Plan, see assessment and plan , written personalized plan provided , see AVS 13. Cognitive Assessment: motor skills and cognition appropriate for age 4. Care team updated , sees regularly only PCP 15. End-of-life care discussed , see AVS  In addition, today we discussed the following: CAD: Good compliance with Crestor and aspirin Depression: Needs a refill on Restoril and Zoloft, symptoms well-controlled Back pain, ongoing issue, treatment?   Review of Systems  Constitutional: No fever. No chills. No unexplained wt changes. No unusual sweats  HEENT: No dental problems, no ear discharge, no facial swelling, no voice changes. No eye discharge, no eye  redness , no  intolerance to light   Respiratory: No wheezing , no  difficulty breathing. No cough , no mucus production  Cardiovascular: No CP, no leg swelling , no  Palpitations  GI: no nausea, no vomiting, no diarrhea , no  abdominal pain. occ heartburn No blood in the stools. No dysphagia, no odynophagia    Endocrine: No polyphagia, no polyuria , no polydipsia  GU: No dysuria, gross hematuria, difficulty urinating. No urinary urgency, no  frequency.  Musculoskeletal: No joint swellings or unusual aches or pains  Skin: No change in the color of the skin, palor , no  Rash  Allergic, immunologic: No environmental allergies , no  food allergies  Neurological: No dizziness no  syncope. Rarely has a  headaches. No diplopia, no slurred, no slurred speech, no motor deficits, no facial  Numbness  Hematological: No enlarged lymph nodes, no easy bruising , no unusual bleedings  Psychiatry: No suicidal ideas, no hallucinations, no beavior problems, no confusion.  No unusual/severe anxiety, no depression   Past Medical History  Diagnosis Date  . Depression   . COPD, mild      PFTs 12/2008, PFTs 6-15 minimal dz  . Hyperlipidemia   . Colon polyp     three 2-83mm polyps in descending colon, medium sized lipoma in sigmoid colon,  multiple 283mm in recto-sigmoid colon  . NSTEMI (non-ST elevated myocardial infarction) 02-2011  . Coronary atherosclerosis of native coronary artery     Multivessel, stent LAD 1998, thrombectomy 2006, PTCA 2011, NSTEMI 6/12 managed medically;  LexiScan Myoview (10/14):  Difficult study, inferior scar with peri-infarct ischemia, inferior HK, EF 45%; low risk  . Full dentures   . Wears glasses   . 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 [id number](681.1)     Past Surgical History  Procedure Laterality Date  . Appendectomy    . Tonsillectomy    . Lumbar fusion  x5 Dr. Corrin Parker  . 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 Coronary stent placement  1998    placed in the LAD  . Thrombectomy  02/2005    acute non ST segment elevation myocardial infarction  . Coronary stent placement  02/2005    PCI/drug-eluting stent implantation mid-LAD  . Balloon angioplasty, artery  04/2010    sp PTCA  . Nasal fracture surgery      x2  . Inguinal hernia repair      x3  . Back surgery  1986  . Mass excision Left 03/09/2013     Procedure: EXCISION left chest wall MASS;  Surgeon: Marcello Moores A. Cornett, MD;  Location: [facility];  Service: General;  Laterality: Left;  . Lumbar laminectomy  08/09/2013     L1  L2    Dr Luiz Ochoa    Social History   Social History  . Marital Status: Widowed    Spouse Name: N/A  . Number of Children: 3  . Years of Education: N/A   Occupational History  . retired from the [employer]     Social History Main Topics  . Smoking status: Heavy Tobacco Smoker -- 0.50 packs/day for 25 years    Types: Cigarettes  . Smokeless tobacco: Never Used     Comment: 1/2 ppd  . Alcohol Use: 0.0 oz/week    0 Standard drinks or equivalent per week     Comment: Rarely   . Drug Use: No     Comment:    . Sexual Activity: Yes   Other Topics Concern  . Not on file   Social History Narrative   Lost 1 son,  Lives w/ g-son     Family History  Problem Relation Age of Onset  . Heart attack Mother   . Lung cancer Father     +smoker  . Diabetes Other     GM, aunt, other memebers  . Prostate cancer Neg Hx        . Colon cancer Neg Hx        Medication List       This list is accurate as of: 05/15/15  9:44 PM.  Always use your most recent med list.               aspirin EC 325 MG tablet  Take 1 tablet (325 mg total) by mouth daily.     nitroGLYCERIN 0.4 MG SL tablet  Commonly known as:  NITROSTAT  Place 1 tablet (0.4 mg total) under the tongue as directed. 1 tablet under tongue at onset of chest pain; you may repeat every  Minutes for up to 3 doses     rosuvastatin 40 MG tablet  Commonly known as:  CRESTOR  Take 1 tablet (40 mg total) by mouth daily.     sertraline 50 MG tablet  Commonly known as:  ZOLOFT  Take 1 tablet (50 mg total) by mouth daily.     traZODone 150 MG tablet  Commonly known as:  DESYREL  Take 2 tablets at bedtime.           Objective:   Physical Exam BP 108/68 mmHg  Pulse 73  Temp(Src) 97.9 F (36.6 C) (Oral)  Ht 5\' 11"  (1.803 m)  Wt  194 lb 8 oz (88.225 kg)  BMI 27.14 kg/m2  SpO2 95% General:   Well developed, well nourished . NAD.  Neck:   No  thyromegaly ,  normal carotid pulse, no bruit HEENT:  Normocephalic . Face symmetric, atraumatic Lungs:  CTA B Normal respiratory effort, no intercostal retractions, no accessory muscle use. Heart: RRR,  no murmur.  No pretibial edema bilaterally  Abdomen:  Not distended, soft, non-tender. No rebound or rigidity. No no bruit Skin: Exposed areas without rash. Not pale. Not jaundice Neurologic:  alert & oriented X3.  Speech normal, gait appropriate for age and unassisted Strength symmetric and appropriate for age.  Psych: Cognition and judgment appear intact.  Cooperative with normal attention span and concentration.  Behavior appropriate. No anxious or depressed appearing.    Assessment & Plan:

## 2015-07-07 ENCOUNTER — Telehealth: Payer: Self-pay | Admitting: Internal Medicine

## 2015-07-07 NOTE — Telephone Encounter (Signed)
Received call from [facility] with pt on the line stating that he wants to get a back wrap and they are sending orders for Dr. Larose Kells to sign. Pt confirmed name and DOB and that he does want to have this done. They are faxing req for wrap to Dr. Larose Kells to [phone].

## 2015-07-10 NOTE — Telephone Encounter (Signed)
Order was not received via fax over the weekend.

## 2015-10-23 ENCOUNTER — Telehealth: Payer: Self-pay | Admitting: Internal Medicine

## 2015-10-23 NOTE — Telephone Encounter (Signed)
lvm inquiring if patient had the flu shot

## 2015-10-23 NOTE — Telephone Encounter (Signed)
Pt had flu shot in November at [facility].

## 2015-10-24 NOTE — Telephone Encounter (Signed)
Flu shot updated. 

## 2015-10-24 NOTE — Addendum Note (Signed)
Addended by: Damita Dunnings D on: 10/24/2015 08:00 AM   Modules accepted: Medications

## 2015-11-15 ENCOUNTER — Ambulatory Visit: Payer: Commercial Managed Care - HMO | Admitting: Internal Medicine

## 2015-11-15 ENCOUNTER — Telehealth: Payer: Self-pay | Admitting: Internal Medicine

## 2015-11-15 MED ORDER — TRAZODONE HCL 150 MG PO TABS: 300.0000 mg | ORAL_TABLET | Freq: Every evening | ORAL | Status: DC | PRN

## 2015-11-15 MED ORDER — SERTRALINE HCL 50 MG PO TABS: 50.0000 mg | ORAL_TABLET | Freq: Every day | ORAL | Status: DC

## 2015-11-15 NOTE — Telephone Encounter (Signed)
Rxs sent

## 2015-11-15 NOTE — Telephone Encounter (Signed)
Okay 90 day supply for each, no RF

## 2015-11-15 NOTE — Telephone Encounter (Signed)
Pt is requesting refill on Trazodone and Zoloft.  Last OV: 05/15/2015, Pt was late for appt today, rescheduled for 11/22/2015 Last Fill: 05/15/2015 #180 and 2RF Pt sig: 2 tablets daily at bedtime UDS: None  Please advise.

## 2015-11-15 NOTE — Telephone Encounter (Signed)
Relation to [id number] Call back number:[phone] Pharmacy: [id number] - [place], [place] [id number] (Phone) [phone] (Fax)         Reason for call:  Patient requesting a few pills of traZODone (DESYREL) 150 MG tablet and sertraline (ZOLOFT) 50 MG tablet to hold him over until 11/22/15 appointment

## 2015-11-20 ENCOUNTER — Telehealth: Payer: Self-pay | Admitting: Internal Medicine

## 2015-11-20 NOTE — Telephone Encounter (Signed)
Pt was no show 11/15/15 1:00pm for follow up appt, pt is rescheduled for 11/22/15, pt may have come in late for appt but no notes, charge or no charge?

## 2015-11-20 NOTE — Telephone Encounter (Signed)
I believe Pt came in late, no charge.

## 2015-11-22 ENCOUNTER — Encounter: Payer: Self-pay | Admitting: Internal Medicine

## 2015-11-22 ENCOUNTER — Ambulatory Visit (INDEPENDENT_AMBULATORY_CARE_PROVIDER_SITE_OTHER): Payer: Commercial Managed Care - HMO | Admitting: Internal Medicine

## 2015-11-22 VITALS — BP 126/78 | HR 71 | Temp 98.1°F | Ht 71.0 in | Wt 203.1 lb

## 2015-11-22 DIAGNOSIS — M542 Cervicalgia: Secondary | ICD-10-CM | POA: Diagnosis not present

## 2015-11-22 DIAGNOSIS — I25810 Atherosclerosis of coronary artery bypass graft(s) without angina pectoris: Secondary | ICD-10-CM | POA: Diagnosis not present

## 2015-11-22 DIAGNOSIS — R739 Hyperglycemia, unspecified: Secondary | ICD-10-CM

## 2015-11-22 DIAGNOSIS — E785 Hyperlipidemia, unspecified: Secondary | ICD-10-CM | POA: Diagnosis not present

## 2015-11-22 DIAGNOSIS — Z09 Encounter for follow-up examination after completed treatment for conditions other than malignant neoplasm: Secondary | ICD-10-CM

## 2015-11-22 LAB — BASIC METABOLIC PANEL
BUN: 13 mg/dL (ref 6–23)
CALCIUM: 9.1 mg/dL (ref 8.4–10.5)
CO2: 28 meq/L (ref 19–32)
Chloride: 108 mEq/L (ref 96–112)
Creatinine, Ser: 0.8 mg/dL (ref 0.40–1.50)
GFR: 101.61 mL/min (ref >60.00)
GLUCOSE: 94 mg/dL (ref 70–99)
Potassium: 4.1 mEq/L (ref 3.5–5.1)
SODIUM: 140 meq/L (ref 135–145)

## 2015-11-22 LAB — HEMOGLOBIN A1C: HEMOGLOBIN A1C: 5.8 % (ref 4.6–6.5)

## 2015-11-22 MED ORDER — TRAZODONE HCL 150 MG PO TABS
300.0000 mg | ORAL_TABLET | Freq: Every evening | ORAL | Status: DC | PRN
Start: 2015-11-22 — End: 2016-05-24

## 2015-11-22 MED ORDER — ROSUVASTATIN CALCIUM 40 MG PO TABS: 40.0000 mg | ORAL_TABLET | Freq: Every day | ORAL | Status: DC

## 2015-11-22 MED ORDER — SERTRALINE HCL 50 MG PO TABS: 50.0000 mg | ORAL_TABLET | Freq: Every day | ORAL | Status: DC

## 2015-11-22 NOTE — Progress Notes (Signed)
Subjective:    Patient ID: Ricardo Palmer Sr., male    DOB: 27-May-1946, 70 y.o.   MRN: [id number]  DOS:  11/22/2015 Type of visit - description :  Interval history: Depression insomnia: Well controlled, needs a refill. CAD: Good compliance with medication, asymptomatic. Still smoking. 5 cigars a week. Neck pain: Going on for several months, left-sided with some radiation to the left shoulder and sometimes even to the left arm. He denies any focal weakness, gait disturbance or bladder or bowel incontinence. No injury.    Review of Systems No chest pain, difficulty breathing or lower extremity edema. No cough or sputum production  Past Medical History  Diagnosis Date  . Depression   . COPD, mild ([place])      PFTs 12/2008, PFTs 6-15 minimal dz  . Hyperlipidemia   . Colon polyp     three 2-43mm polyps in descending colon, medium sized lipoma in sigmoid colon,  multiple 239mm in recto-sigmoid colon  . NSTEMI (non-ST elevated myocardial infarction) ([place]) 02-2011  . Coronary atherosclerosis of native coronary artery     Multivessel, stent LAD 1998, thrombectomy 2006, PTCA 2011, NSTEMI 6/12 managed medically;  LexiScan Myoview (10/14):  Difficult study, inferior scar with peri-infarct ischemia, inferior HK, EF 45%; low risk  . Full dentures   . Wears glasses   . 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 [id number]     Past Surgical History  Procedure Laterality Date  . Appendectomy    . Tonsillectomy    . Lumbar fusion      x5 Dr. Corrin Parker  . 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 Coronary stent placement  1998    placed in the LAD  . Thrombectomy  02/2005    acute non ST segment elevation myocardial infarction  . Coronary stent placement  02/2005    PCI/drug-eluting stent implantation mid-LAD  . Balloon angioplasty, artery  04/2010    sp PTCA  . Nasal fracture surgery      x2    . Inguinal hernia repair      x3  . Back surgery  1986  . Mass excision Left 03/09/2013    Procedure: EXCISION left chest wall MASS;  Surgeon: Marcello Moores A. Cornett, MD;  Location: [facility];  Service: General;  Laterality: Left;  . Lumbar laminectomy  08/09/2013     L1  L2    Dr Luiz Ochoa    Social History   Social History  . Marital Status: Widowed    Spouse Name: N/A  . Number of Children: 3  . Years of Education: N/A   Occupational History  . retired from the rail [employer]     Social History Main Topics  . Smoking status: Heavy Tobacco Smoker -- 0.50 packs/day for 25 years    Types: Cigarettes  . Smokeless tobacco: Never Used     Comment: 1/2 ppd  . Alcohol Use: 0.0 oz/week    0 Standard drinks or equivalent per week     Comment: Rarely   . Drug Use: No     Comment:    . Sexual Activity: Yes   Other Topics Concern  . Not on file   Social History Narrative   Lost 1 son,  Lives w/ g-son        Medication List  This list is accurate as of: 11/22/15 11:59 PM.  Always use your most recent med list.               aspirin EC 325 MG tablet  Take 1 tablet (325 mg total) by mouth daily.     ENSURE  Take 237 mLs by mouth daily.     MULTIVITAMIN ADULT PO  Take 1 tablet by mouth daily.     nitroGLYCERIN 0.4 MG SL tablet  Commonly known as:  NITROSTAT  Place 1 tablet (0.4 mg total) under the tongue as directed. 1 tablet under tongue at onset of chest pain; you may repeat every  Minutes for up to 3 doses     rosuvastatin 40 MG tablet  Commonly known as:  CRESTOR  Take 1 tablet (40 mg total) by mouth daily.     sertraline 50 MG tablet  Commonly known as:  ZOLOFT  Take 1 tablet (50 mg total) by mouth daily.     traZODone 150 MG tablet  Commonly known as:  DESYREL  Take 2 tablets (300 mg total) by mouth at bedtime as needed for sleep.           Objective:   Physical Exam BP 126/78 mmHg  Pulse 71  Temp(Src) 98.1 F (36.7 C) (Oral)  Ht 5'  11" (1.803 m)  Wt 203 lb 2 oz (92.137 kg)  BMI 28.34 kg/m2  SpO2 94% General:   Well developed, well nourished . NAD.  HEENT:  Normocephalic . Face symmetric, atraumatic Neck: No TTP at the cervical spine. Range of motion seems normal Lungs:  CTA B Normal respiratory effort, no intercostal retractions, no accessory muscle use. Heart: RRR,  no murmur.  no pretibial edema bilaterally  Skin: Not pale. Not jaundice Neurologic:  alert & oriented X3.  Speech normal, gait appropriate for age and unassisted. DTRs and strength symmetric, lower extremity coordination normal Psych--  Cognition and judgment appear intact.  Cooperative with normal attention span and concentration.  Behavior appropriate. No anxious or depressed appearing.    Assessment & Plan:   Assessment prediabetes Hyperlipidemia COPD, last PFTs 2015 minimal disease Anxiety -depression- insomnia CAD Stress test 06-2013: poor quality but apparently unchanged from previous.  Doesn't  see cardiology regularly, last visit? 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 Tramadol,didn't  work well for him.Declined referral to pain mngmt 2016   PLAN Prediabetes: Diet control, check A1c CAD: Continue Crestor, aspirin. Check a BMP Depression and insomnia: Refill medications, symptoms well-controlled Neck pain: Some radicular features, neuro exam normal, we agreed on refer to [facility] primary care: [facility] RTC 04-2016, CPX

## 2015-11-22 NOTE — Progress Notes (Signed)
Pre visit review using our clinic review tool, if applicable. No additional management support is needed unless otherwise documented below in the visit note. 

## 2015-11-22 NOTE — Patient Instructions (Signed)
GO TO THE LAB : Get the blood work    GO TO THE FRONT DESK  Schedule a complete physical exam to be done in 6 months  Please be fasting

## 2015-11-23 DIAGNOSIS — Z09 Encounter for follow-up examination after completed treatment for conditions other than malignant neoplasm: Secondary | ICD-10-CM | POA: Insufficient documentation

## 2015-11-23 NOTE — Assessment & Plan Note (Signed)
Prediabetes: Diet control, check A1c CAD: Continue Crestor, aspirin. Check a BMP Depression and insomnia: Refill medications, symptoms well-controlled Neck pain: Some radicular features, neuro exam normal, we agreed on refer to [facility] primary care: [facility] 04-2016, CPX

## 2015-11-27 DIAGNOSIS — M542 Cervicalgia: Secondary | ICD-10-CM | POA: Diagnosis not present

## 2015-11-27 DIAGNOSIS — M50322 Other cervical disc degeneration at C5-C6 level: Secondary | ICD-10-CM | POA: Diagnosis not present

## 2015-12-28 DIAGNOSIS — M50823 Other cervical disc disorders at C6-C7 level: Secondary | ICD-10-CM | POA: Diagnosis not present

## 2015-12-28 DIAGNOSIS — M50322 Other cervical disc degeneration at C5-C6 level: Secondary | ICD-10-CM | POA: Diagnosis not present

## 2016-01-10 DIAGNOSIS — M50823 Other cervical disc disorders at C6-C7 level: Secondary | ICD-10-CM | POA: Diagnosis not present

## 2016-01-16 DIAGNOSIS — M50322 Other cervical disc degeneration at C5-C6 level: Secondary | ICD-10-CM | POA: Diagnosis not present

## 2016-01-16 DIAGNOSIS — M50823 Other cervical disc disorders at C6-C7 level: Secondary | ICD-10-CM | POA: Diagnosis not present

## 2016-02-11 ENCOUNTER — Other Ambulatory Visit: Payer: Self-pay | Admitting: Internal Medicine

## 2016-02-12 NOTE — Telephone Encounter (Signed)
Received refill request for Trazodone and Sertraline. Both refilled on 11/22/2015 to [facility] mail order pharmacy. Refill denied, instructed Pt to call office first to clarify which pharmacy he is using.

## 2016-02-13 DIAGNOSIS — M50322 Other cervical disc degeneration at C5-C6 level: Secondary | ICD-10-CM | POA: Diagnosis not present

## 2016-03-01 ENCOUNTER — Other Ambulatory Visit: Payer: Self-pay

## 2016-03-01 DIAGNOSIS — E785 Hyperlipidemia, unspecified: Secondary | ICD-10-CM

## 2016-03-01 MED ORDER — ROSUVASTATIN CALCIUM 40 MG PO TABS: 40.0000 mg | ORAL_TABLET | Freq: Every day | ORAL | Status: DC

## 2016-03-04 ENCOUNTER — Other Ambulatory Visit: Payer: Self-pay | Admitting: Internal Medicine

## 2016-03-18 ENCOUNTER — Other Ambulatory Visit: Payer: Self-pay | Admitting: Internal Medicine

## 2016-05-24 ENCOUNTER — Encounter: Payer: Self-pay | Admitting: Internal Medicine

## 2016-05-24 ENCOUNTER — Ambulatory Visit (INDEPENDENT_AMBULATORY_CARE_PROVIDER_SITE_OTHER): Payer: Commercial Managed Care - HMO | Admitting: Internal Medicine

## 2016-05-24 VITALS — BP 128/78 | HR 65 | Temp 98.2°F | Resp 14 | Ht 71.0 in | Wt 190.1 lb

## 2016-05-24 DIAGNOSIS — F329 Major depressive disorder, single episode, unspecified: Secondary | ICD-10-CM | POA: Diagnosis not present

## 2016-05-24 DIAGNOSIS — Z23 Encounter for immunization: Secondary | ICD-10-CM | POA: Diagnosis not present

## 2016-05-24 DIAGNOSIS — I2510 Atherosclerotic heart disease of native coronary artery without angina pectoris: Secondary | ICD-10-CM

## 2016-05-24 DIAGNOSIS — Z125 Encounter for screening for malignant neoplasm of prostate: Secondary | ICD-10-CM

## 2016-05-24 DIAGNOSIS — G8929 Other chronic pain: Secondary | ICD-10-CM

## 2016-05-24 DIAGNOSIS — M549 Dorsalgia, unspecified: Secondary | ICD-10-CM | POA: Diagnosis not present

## 2016-05-24 DIAGNOSIS — F32A Depression, unspecified: Secondary | ICD-10-CM

## 2016-05-24 DIAGNOSIS — Z0000 Encounter for general adult medical examination without abnormal findings: Secondary | ICD-10-CM | POA: Diagnosis not present

## 2016-05-24 DIAGNOSIS — R739 Hyperglycemia, unspecified: Secondary | ICD-10-CM | POA: Diagnosis not present

## 2016-05-24 LAB — BASIC METABOLIC PANEL
BUN: 15 mg/dL (ref 7–25)
CO2: 27 mmol/L (ref 20–31)
Calcium: 8.9 mg/dL (ref 8.6–10.3)
Chloride: 106 mmol/L (ref 98–110)
Creat: 1.01 mg/dL (ref 0.70–1.18)
Glucose, Bld: 89 mg/dL (ref 65–99)
POTASSIUM: 4.4 mmol/L (ref 3.5–5.3)
SODIUM: 141 mmol/L (ref 135–146)

## 2016-05-24 LAB — LIPID PANEL
Cholesterol: 101 mg/dL — ABNORMAL LOW (ref 125–200)
HDL: 33 mg/dL — AB (ref >=40)
LDL Cholesterol: 49 mg/dL (ref <130)
TRIGLYCERIDES: 94 mg/dL (ref <150)
Total CHOL/HDL Ratio: 3.1 Ratio (ref <=5.0)
VLDL: 19 mg/dL (ref <30)

## 2016-05-24 LAB — ALT: ALT: 13 U/L (ref 9–46)

## 2016-05-24 LAB — AST: AST: 20 U/L (ref 10–35)

## 2016-05-24 MED ORDER — NITROGLYCERIN 0.4 MG SL SUBL: 0.4000 mg | SUBLINGUAL_TABLET | SUBLINGUAL | 1 refills | Status: DC

## 2016-05-24 MED ORDER — TRAZODONE HCL 150 MG PO TABS: 300.0000 mg | ORAL_TABLET | Freq: Every evening | ORAL | 2 refills | Status: DC | PRN

## 2016-05-24 NOTE — Assessment & Plan Note (Addendum)
Td 04-2016 ;  pneumonia shot 12-2009 ("make me sick" ); prevnar -- strongly declined Shingles shot--strongly declines flu shot  today [facility]: cscope 11-09, hyperplastic polyps  Dr Oletta Lamas,  he was due for one in 2012 but never had it done also pt was dismissed from their office. Was referred to GI 2015, did not go. Today we discussed again referral for Cscope, risk of cancer-->  declined   Prostate  ca screening: has declined DREs before and today, consented for a PSA check  Lung cancer screening discussed -- declined , benefits discussed Diet- exercise discussed Labs: BMP, AST, ALT, FLP, A1c, PSA tobacco abuse -- making progress,  smoking much less, praised

## 2016-05-24 NOTE — Progress Notes (Signed)
Pre visit review using our clinic review tool, if applicable. No additional management support is needed unless otherwise documented below in the visit note. 

## 2016-05-24 NOTE — Progress Notes (Signed)
Subjective:    Patient ID: Ricardo Palmer Sr., male    DOB: [birth year], 70 y.o.   MRN: [id number]  DOS:  05/24/2016 Type of visit - description :    Here for Medicare AWV:  1. Risk factors based on Past M, S, F history: reviewed 2. Physical Activities:  Unable to exercise d/t back pain 3. Depression/mood: neg screening , on meds  4. Hearing: admits to diff hearing, no problems noted during normal conversation, no worse , offered referral (declined)  5. ADL's: independent, drives  6. Fall Risk: + recent falls, no syncope, no head-neck injury; prevention discussed , see AVS 7. home Safety: does feel safe at home  8. Height, weight, & visual acuity: see VS, due to see the eye doctor~ 08-2015 9. Counseling: provided 10. Labs ordered based on risk factors: if needed  11. Referral Coordination: if needed 12. Care Plan, see assessment and plan , written personalized plan provided , see AVS 13. Cognitive Assessment: motor skills and cognition appropriate for age 42. Care team updated , sees regularly only PCP 15. End-of-life care discussed , rec to get a HC POA  In addition, today we discussed the following High cholesterol: On Crestor, no apparent side effects. Good compliance with Zoloft, symptoms controlled Insomnia: On trazodone, needs refills.  Review of Systems Constitutional: No fever. No chills. No unexplained wt changes. No unusual sweats  HEENT: No dental problems, no ear discharge, no facial swelling, no voice changes. No eye discharge, no eye  redness , no  intolerance to light   Respiratory: No wheezing , no  difficulty breathing. No cough , no mucus production  Cardiovascular: No CP, no leg swelling , no  Palpitations  GI: no nausea, no vomiting, no diarrhea , no  abdominal pain.  No blood in the stools. No dysphagia, no odynophagia    Endocrine: No polyphagia, no polyuria , no polydipsia  GU: No dysuria, gross hematuria, difficulty urinating. No urinary urgency,  no frequency.  Musculoskeletal: No joint swellings or unusual aches or pains. Occ  his left knee/leg give out, thinks related to arthritis and back pain. No actual weakness  Skin: No change in the color of the skin, palor , no  Rash  Allergic, immunologic: No environmental allergies , no  food allergies  Neurological: No dizziness no  syncope. No headaches. No diplopia, no slurred, no slurred speech, no motor deficits, no facial  Numbness  Hematological: No enlarged lymph nodes, no easy bruising , no unusual bleedings  Psychiatry: No suicidal ideas, no hallucinations, no beavior problems, no confusion.  No unusual/severe anxiety, no depression   Past Medical History:  Diagnosis Date  . Colon polyp    three 2-32mm polyps in descending colon, medium sized lipoma in sigmoid colon,  multiple 267mm in recto-sigmoid colon  . COPD, mild ([place])     PFTs 12/2008, PFTs 6-15 minimal dz  . Coronary atherosclerosis of native coronary artery    Multivessel, stent LAD 1998, thrombectomy 2006, PTCA 2011, NSTEMI 6/12 managed medically;  LexiScan Myoview (10/14):  Difficult study, inferior scar with peri-infarct ischemia, inferior HK, EF 45%; low risk  . 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 Depression   . Full dentures   . Headache(784.0)   . Hyperlipidemia   . NSTEMI (non-ST elevated myocardial infarction) ([place]) 02-2011  . Wears glasses     Past Surgical History:  Procedure  Laterality Date  . APPENDECTOMY    . BACK SURGERY  1986  . BALLOON ANGIOPLASTY, ARTERY  04/2010   sp PTCA  . [place]   placed in the LAD  . CORONARY STENT PLACEMENT  02/2005   PCI/drug-eluting stent implantation mid-LAD  . INGUINAL HERNIA REPAIR     x3  . LUMBAR FUSION     x5 Dr. Corrin Parker  . LUMBAR LAMINECTOMY  08/09/2013    L1  L2    Dr Luiz Ochoa  . MASS EXCISION Left 03/09/2013   Procedure: EXCISION left chest  wall MASS;  Surgeon: Marcello Moores A. Cornett, MD;  Location: [facility];  Service: General;  Laterality: Left;  . NASAL FRACTURE SURGERY     x2  . 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 THROMBECTOMY  02/2005   acute non ST segment elevation myocardial infarction  . TONSILLECTOMY      Social History   Social History  . Marital status: Widowed    Spouse name: N/A  . Number of children: 3  . Years of education: N/A   Occupational History  . retired from the [employer]     Social History Main Topics  . Smoking status: Light Tobacco Smoker    Packs/day: 0.25    Years: 25.00    Types: Cigarettes  . Smokeless tobacco: Never Used     Comment: 1/2 ppd  . Alcohol use 0.0 oz/week     Comment: Rarely   . Drug use: No     Comment:    . Sexual activity: Yes   Other Topics Concern  . Not on file   Social History Narrative   Lost 1 son,  Lives w/ g-son , his wife and the g-g son     Family History  Problem Relation Age of Onset  . Heart attack Mother   . Lung cancer Father     +smoker  . Diabetes Other     GM, aunt, other memebers  . Prostate cancer Neg Hx        . Colon cancer Neg Hx        Medication List       Accurate as of 05/24/16 11:59 PM. Always use your most recent med list.          aspirin EC 325 MG tablet Take 1 tablet (325 mg total) by mouth daily.   ENSURE Take 237 mLs by mouth daily.   MULTIVITAMIN ADULT PO Take 1 tablet by mouth daily. Heart Healthy   nitroGLYCERIN 0.4 MG SL tablet Commonly known as:  NITROSTAT Place 1 tablet (0.4 mg total) under the tongue as directed. 1 tablet under tongue at onset of chest pain; you may repeat every  Minutes for up to 3 doses   rosuvastatin 40 MG tablet Commonly known as:  CRESTOR Take 1 tablet (40 mg total) by mouth daily.   sertraline 50 MG tablet Commonly known as:  ZOLOFT Take 1 tablet (50 mg total) by mouth daily.   traZODone 150 MG tablet Commonly known as:  DESYREL Take 2  tablets (300 mg total) by mouth at bedtime as needed for sleep.          Objective:   Physical Exam BP 128/78 (BP Location: Right Arm, Patient Position: Sitting, Cuff Size: Small)   Pulse 65   Temp 98.2 F (36.8 C) (Oral)   Resp 14   Ht 5\' 11"  (1.803 m)  Wt 190 lb 2 oz (86.2 kg)   SpO2 95%   BMI 26.52 kg/m   General:   Well developed, well nourished . NAD.  Neck: No  thyromegaly . Normal carotid pulses HEENT:  Normocephalic . Face symmetric, atraumatic Lungs:  While decreased breath sounds Normal respiratory effort, no intercostal retractions, no accessory muscle use. Heart: RRR,  no murmur.  No pretibial edema bilaterally  Normal femoral-pedal pulses bilaterally Abdomen:  Not distended, soft, non-tender. No rebound or rigidity.  No bruit Skin: Exposed areas without rash. Not pale. Not jaundice. Capillary fragility noted upper extremities Neurologic:  alert & oriented X3.  Speech normal, gait appropriate for age and unassisted Strength symmetric and appropriate for age.  Psych: Cognition and judgment appear intact.  Cooperative with normal attention span and concentration.  Behavior appropriate. No anxious or depressed appearing.    Assessment & Plan:    Assessment prediabetes Hyperlipidemia COPD, last PFTs 2015 minimal disease Anxiety -depression- insomnia CAD Stress test 06-2013: poor quality but apparently unchanged from previous.  Doesn't  see cardiology regularly, last visit? 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 Tramadol,didn't  work well for him.Declined referral to pain mngmt 2016   PLAN Prediabetes: Check A1c Hyperlipidemia: Continue Crestor, check AST, ALT, FLP. Anxiety  depression and insomnia: On Zoloft, sx controlled, refill trazodone CAD: ASX. Continue aspirin, Crestor. BP is normal, not on BBs  or ACEi. EKG today normal sinus rhythm, no  change from previous DJD: Back pain per Dr.Ramos RTC 6 months

## 2016-05-24 NOTE — Patient Instructions (Addendum)
Get your blood work before you leave    Please consider visit these websites for more information about the healthcare power of attorney:  www.begintheconversation.org  theconversationproject.org  Next visit in 6 months   Fall Prevention and Home Safety Falls cause injuries and can affect all age groups. It is possible to use preventive measures to significantly decrease the likelihood of falls. There are many simple measures which can make your home safer and prevent falls. OUTDOORS  Repair cracks and edges of walkways and driveways.  Remove high doorway thresholds.  Trim shrubbery on the main path into your home.  Have good outside lighting.  Clear walkways of tools, rocks, debris, and clutter.  Check that handrails are not broken and are securely fastened. Both sides of steps should have handrails.  Have leaves, snow, and ice cleared regularly.  Use sand or salt on walkways during winter months.  In the garage, clean up grease or oil spills. BATHROOM  Install night lights.  Install grab bars by the toilet and in the tub and shower.  Use non-skid mats or decals in the tub or shower.  Place a plastic non-slip stool in the shower to sit on, if needed.  Keep floors dry and clean up all water on the floor immediately.  Remove soap buildup in the tub or shower on a regular basis.  Secure bath mats with non-slip, double-sided rug tape.  Remove throw rugs and tripping hazards from the floors. BEDROOMS  Install night lights.  Make sure a bedside light is easy to reach.  Do not use oversized bedding.  Keep a telephone by your bedside.  Have a firm chair with side arms to use for getting dressed.  Remove throw rugs and tripping hazards from the floor. KITCHEN  Keep handles on pots and pans turned toward the center of the stove. Use back burners when possible.  Clean up spills quickly and allow time for drying.  Avoid walking on wet floors.  Avoid hot  utensils and knives.  Position shelves so they are not too high or low.  Place commonly used objects within easy reach.  If necessary, use a sturdy step stool with a grab bar when reaching.  Keep electrical cables out of the way.  Do not use floor polish or wax that makes floors slippery. If you must use wax, use non-skid floor wax.  Remove throw rugs and tripping hazards from the floor. STAIRWAYS  Never leave objects on stairs.  Place handrails on both sides of stairways and use them. Fix any loose handrails. Make sure handrails on both sides of the stairways are as long as the stairs.  Check carpeting to make sure it is firmly attached along stairs. Make repairs to worn or loose carpet promptly.  Avoid placing throw rugs at the top or bottom of stairways, or properly secure the rug with carpet tape to prevent slippage. Get rid of throw rugs, if possible.  Have an electrician put in a light switch at the top and bottom of the stairs. OTHER FALL PREVENTION TIPS  Wear low-heel or rubber-soled shoes that are supportive and fit well. Wear closed toe shoes.  When using a stepladder, make sure it is fully opened and both spreaders are firmly locked. Do not climb a closed stepladder.  Add color or contrast paint or tape to grab bars and handrails in your home. Place contrasting color strips on first and last steps.  Learn and use mobility aids as needed. Install an Dealer  emergency response system.  Turn on lights to avoid dark areas. Replace light bulbs that burn out immediately. Get light switches that glow.  Arrange furniture to create clear pathways. Keep furniture in the same place.  Firmly attach carpet with non-skid or double-sided tape.  Eliminate uneven floor surfaces.  Select a carpet pattern that does not visually hide the edge of steps.  Be aware of all pets. OTHER HOME SAFETY TIPS  Set the water temperature for 120 F (48.8 C).  Keep emergency numbers on  or near the telephone.  Keep smoke detectors on every level of the home and near sleeping areas. Document Released: 08/30/2002 Document Revised: 03/10/2012 Document Reviewed: 11/29/2011 [facility] Patient Information 2015 [place], [place]. This information is not intended to replace advice given to you by your health care provider. Make sure you discuss any questions you have with your health care provider.   Preventive Care for Adults Ages 70 and over  Blood pressure check.** / Every 1 to 2 years.  Lipid and cholesterol check.**/ Every 5 years beginning at age 70.  Lung cancer screening. / Every year if you are aged 34-80 years and have a 30-pack-year history of smoking and currently smoke or have quit within the past 15 years. Yearly screening is stopped once you have quit smoking for at least 15 years or develop a health problem that would prevent you from having lung cancer treatment.  Fecal occult blood test (FOBT) of stool. / Every year beginning at age 60 and continuing until age 27. You may not have to do this test if you get a colonoscopy every 10 years.  Flexible sigmoidoscopy** or colonoscopy.** / Every 5 years for a flexible sigmoidoscopy or every 10 years for a colonoscopy beginning at age 34 and continuing until age 101.  Hepatitis C blood test.** / For all people born from [birth year] through 1965 and any individual with known risks for hepatitis C.  Abdominal aortic aneurysm (AAA) screening.** / A one-time screening for ages 65 to 32 years who are current or former smokers.  Skin self-exam. / Monthly.  Influenza vaccine. / Every year.  Tetanus, diphtheria, and acellular pertussis (Tdap/Td) vaccine.** / 1 dose of Td every 10 years.  Varicella vaccine.** / Consult your health care provider.  Zoster vaccine.** / 1 dose for adults aged 85 years or older.  Pneumococcal 13-valent conjugate (PCV13) vaccine.** / Consult your health care provider.  Pneumococcal polysaccharide (PPSV23)  vaccine.** / 1 dose for all adults aged 16 years and older.  Meningococcal vaccine.** / Consult your health care provider.  Hepatitis A vaccine.** / Consult your health care provider.  Hepatitis B vaccine.** / Consult your health care provider.  Haemophilus influenzae type b (Hib) vaccine.** / Consult your health care provider. **Family history and personal history of risk and conditions may change your health care provider's recommendations. Document Released: 11/05/2001 Document Revised: 09/14/2013 Document Reviewed: 02/04/2011 [facility] Patient Information 2015 [place], [place]. This information is not intended to replace advice given to you by your health care provider. Make sure you discuss any questions you have with your health care provider.

## 2016-05-25 LAB — HEMOGLOBIN A1C
HEMOGLOBIN A1C: 5.2 % (ref <5.7)
Mean Plasma Glucose: 103 mg/dL

## 2016-05-25 LAB — PSA: PSA: 0.4 ng/mL (ref <=4.0)

## 2016-05-27 NOTE — Assessment & Plan Note (Signed)
Prediabetes: Check A1c Hyperlipidemia: Continue Crestor, check AST, ALT, FLP. Anxiety  depression and insomnia: On Zoloft, sx controlled, refill trazodone CAD: ASX. Continue aspirin, Crestor. BP is normal, not on BBs  or ACEi. EKG today normal sinus rhythm, no change from previous DJD: Back pain per Dr.Ramos RTC 6 months

## 2016-05-28 ENCOUNTER — Other Ambulatory Visit: Payer: Self-pay

## 2016-05-28 MED ORDER — NITROGLYCERIN 0.4 MG SL SUBL: 0.4000 mg | SUBLINGUAL_TABLET | SUBLINGUAL | 1 refills | Status: AC | PRN

## 2016-08-13 ENCOUNTER — Other Ambulatory Visit: Payer: Self-pay | Admitting: Internal Medicine

## 2016-10-28 ENCOUNTER — Other Ambulatory Visit: Payer: Self-pay | Admitting: Internal Medicine

## 2016-10-28 DIAGNOSIS — E785 Hyperlipidemia, unspecified: Secondary | ICD-10-CM

## 2016-11-22 ENCOUNTER — Encounter: Payer: Self-pay | Admitting: Internal Medicine

## 2016-11-22 ENCOUNTER — Ambulatory Visit (INDEPENDENT_AMBULATORY_CARE_PROVIDER_SITE_OTHER): Payer: Medicare HMO | Admitting: Internal Medicine

## 2016-11-22 ENCOUNTER — Ambulatory Visit: Payer: Commercial Managed Care - HMO | Admitting: Internal Medicine

## 2016-11-22 VITALS — BP 124/72 | HR 75 | Temp 98.0°F | Resp 14 | Ht 71.0 in | Wt 203.1 lb

## 2016-11-22 DIAGNOSIS — F329 Major depressive disorder, single episode, unspecified: Secondary | ICD-10-CM

## 2016-11-22 DIAGNOSIS — I2510 Atherosclerotic heart disease of native coronary artery without angina pectoris: Secondary | ICD-10-CM | POA: Diagnosis not present

## 2016-11-22 DIAGNOSIS — E785 Hyperlipidemia, unspecified: Secondary | ICD-10-CM

## 2016-11-22 DIAGNOSIS — F419 Anxiety disorder, unspecified: Secondary | ICD-10-CM

## 2016-11-22 DIAGNOSIS — F32A Depression, unspecified: Secondary | ICD-10-CM

## 2016-11-22 DIAGNOSIS — F418 Other specified anxiety disorders: Secondary | ICD-10-CM

## 2016-11-22 MED ORDER — TRAZODONE HCL 150 MG PO TABS: 300.0000 mg | ORAL_TABLET | Freq: Every evening | ORAL | 2 refills | Status: DC | PRN

## 2016-11-22 MED ORDER — ROSUVASTATIN CALCIUM 40 MG PO TABS: 40.0000 mg | ORAL_TABLET | Freq: Every day | ORAL | 2 refills | Status: DC

## 2016-11-22 MED ORDER — SERTRALINE HCL 50 MG PO TABS: 50.0000 mg | ORAL_TABLET | Freq: Every day | ORAL | 2 refills | Status: DC

## 2016-11-22 NOTE — Patient Instructions (Signed)
  GO TO THE FRONT DESK Schedule your next appointment for a physical exam by September 2018  Schedule a Medicare wellness with one of our nurses at your convenience

## 2016-11-22 NOTE — Progress Notes (Signed)
Subjective:    Patient ID: Ricardo Palmer Sr., male    DOB: 1946-03-16, 71 y.o.   MRN: [id number]  DOS:  11/22/2016 Type of visit - description : Routine checkup Interval history: In general feeling well. No major concerns Tobacco: He quit tobacco 3 months ago. Has gained weight, somewhat concerned about it Anxiety depression insomnia: Symptoms well-controlled, needs a refill High chol : Labs reviewed, excellent control, needs a refill.  Wt Readings from Last 3 Encounters:  11/22/16 203 lb 2 oz (92.1 kg)  05/24/16 190 lb 2 oz (86.2 kg)  11/22/15 203 lb 2 oz (92.1 kg)     Review of Systems Denies chest pain or difficulty breathing Continue with chronic neck, back and hip pain.  Past Medical History:  Diagnosis Date  . Colon polyp    three 2-70mm polyps in descending colon, medium sized lipoma in sigmoid colon,  multiple 256mm in recto-sigmoid colon  . COPD, mild ([place])     PFTs 12/2008, PFTs 6-15 minimal dz  . Coronary atherosclerosis of native coronary artery    Multivessel, stent LAD 1998, thrombectomy 2006, PTCA 2011, NSTEMI 6/12 managed medically;  LexiScan Myoview (10/14):  Difficult study, inferior scar with peri-infarct ischemia, inferior HK, EF 45%; low risk  . 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 Depression   . Full dentures   . Headache(784.0)   . Hyperlipidemia   . NSTEMI (non-ST elevated myocardial infarction) ([place]) 02-2011  . Wears glasses     Past Surgical History:  Procedure Laterality Date  . APPENDECTOMY    . BACK SURGERY  1986  . BALLOON ANGIOPLASTY, ARTERY  04/2010   sp PTCA  . [place]   placed in the LAD  . CORONARY STENT PLACEMENT  02/2005   PCI/drug-eluting stent implantation mid-LAD  . INGUINAL HERNIA REPAIR     x3  . LUMBAR FUSION     x5 Dr. Corrin Parker  . LUMBAR LAMINECTOMY  08/09/2013    L1  L2    Dr Luiz Ochoa  . MASS EXCISION Left  03/09/2013   Procedure: EXCISION left chest wall MASS;  Surgeon: Marcello Moores A. Cornett, MD;  Location: [facility];  Service: General;  Laterality: Left;  . NASAL FRACTURE SURGERY     x2  . 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 THROMBECTOMY  02/2005   acute non ST segment elevation myocardial infarction  . TONSILLECTOMY      Social History   Social History  . Marital status: Widowed    Spouse name: N/A  . Number of children: 3  . Years of education: N/A   Occupational History  . retired from the rail [employer]     Social History Main Topics  . Smoking status: Former Smoker    Packs/day: 0.25    Years: 25.00    Types: Cigarettes    Quit date: 07/2016  . Smokeless tobacco: Never Used     Comment: 1/2 ppd  . Alcohol use 0.0 oz/week     Comment: Rarely   . Drug use: No     Comment:    . Sexual activity: Yes   Other Topics Concern  . Not on file   Social History Narrative   Lost 1 son,  Lives w/ g-son , his wife and the g-g son      Allergies as  of 11/22/2016      Reactions   Acetaminophen    REACTION: tongue swells   Codeine    REACTION: Makes "him mean"   Hydrocodone-acetaminophen    REACTION: nausea, vomiting "makes me deathly sick"   Tetracycline    REACTION: loss of balance   Vicodin [hydrocodone-acetaminophen]       Medication List       Accurate as of 11/22/16 11:59 PM. Always use your most recent med list.          aspirin EC 325 MG tablet Take 1 tablet (325 mg total) by mouth daily.   MULTIVITAMIN ADULT PO Take 1 tablet by mouth daily. Heart Healthy   nitroGLYCERIN 0.4 MG SL tablet Commonly known as:  NITROSTAT Place 1 tablet (0.4 mg total) under the tongue every 5 (five) minutes as needed for chest pain. Then contact physician and get emergent medical care.   rosuvastatin 40 MG tablet Commonly known as:  CRESTOR Take 1 tablet (40 mg total) by mouth daily.   sertraline 50 MG tablet Commonly known as:  ZOLOFT Take 1 tablet  (50 mg total) by mouth daily.   traZODone 150 MG tablet Commonly known as:  DESYREL Take 2 tablets (300 mg total) by mouth at bedtime as needed for sleep.          Objective:   Physical Exam BP 124/72 (BP Location: Left Arm, Patient Position: Sitting, Cuff Size: Normal)   Pulse 75   Temp 98 F (36.7 C) (Oral)   Resp 14   Ht 5\' 11"  (1.803 m)   Wt 203 lb 2 oz (92.1 kg)   SpO2 93%   BMI 28.33 kg/m   General:   Well developed, well nourished . NAD.  HEENT:  Normocephalic . Face symmetric, atraumatic Lungs:  CTA B Normal respiratory effort, no intercostal retractions, no accessory muscle use. Heart: RRR,  no murmur.  No pretibial edema bilaterally  Skin: Not pale. Not jaundice Neurologic:  alert & oriented X3.  Speech normal, gait appropriate for age and unassisted Psych--  Cognition and judgment appear intact.  Cooperative with normal attention span and concentration.  Behavior appropriate. No anxious or depressed appearing.     Assessment & Plan:   Assessment prediabetes Hyperlipidemia COPD, last PFTs 2015 minimal disease Anxiety -depression- insomnia CAD Stress test 06-2013: poor quality but apparently unchanged from previous.  Doesn't  see cardiology regularly, last visit? 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 Tramadol,didn't  work well for him.Declined referral to pain mngmt 2016     PLAN Hyperlipidemia: On Crestor, last FLP very good. 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 Asx, no change Declined  Prevnar, states he had a reaction to a pneumonia shot before RTC 9-18, CPX. Medicare wellness at his convenience

## 2016-11-22 NOTE — Progress Notes (Signed)
Pre visit review using our clinic review tool, if applicable. No additional management support is needed unless otherwise documented below in the visit note. 

## 2016-11-24 NOTE — Assessment & Plan Note (Signed)
Hyperlipidemia: On Crestor, last FLP very good. 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 Asx, no change Declined  Prevnar, states he had a reaction to a pneumonia shot before RTC 9-18, CPX. Medicare wellness at his convenience

## 2017-05-30 ENCOUNTER — Encounter: Payer: Self-pay | Admitting: Internal Medicine

## 2017-05-30 ENCOUNTER — Ambulatory Visit (INDEPENDENT_AMBULATORY_CARE_PROVIDER_SITE_OTHER): Payer: Medicare HMO | Admitting: Internal Medicine

## 2017-05-30 VITALS — BP 126/68 | HR 63 | Temp 97.8°F | Resp 14 | Ht 71.0 in | Wt 207.5 lb

## 2017-05-30 DIAGNOSIS — Z1159 Encounter for screening for other viral diseases: Secondary | ICD-10-CM

## 2017-05-30 DIAGNOSIS — Z0000 Encounter for general adult medical examination without abnormal findings: Secondary | ICD-10-CM | POA: Diagnosis not present

## 2017-05-30 DIAGNOSIS — E785 Hyperlipidemia, unspecified: Secondary | ICD-10-CM

## 2017-05-30 NOTE — Patient Instructions (Signed)
GO TO THE LAB : Get the blood work    GO TO THE FRONT DESK Schedule your next appointment for a  routine checkup in 6-8 months   Schedule Medicare wellness visit with one of our nurses

## 2017-05-30 NOTE — Progress Notes (Signed)
Pre visit review using our clinic review tool, if applicable. No additional management support is needed unless otherwise documented below in the visit note. 

## 2017-05-30 NOTE — Progress Notes (Signed)
Subjective:    Patient ID: Ricardo Palmer Sr., male    DOB: 1945-11-28, 71 y.o.   MRN: [id number]  DOS:  05/30/2017 Type of visit - description : cpx Interval history: In general feeling well, no major concerns, good medication compliance   Review of Systems Still has back pain, went to the [place], had a MRI, results pending. He is still tobacco free.  Other than above, a 14 point review of systems is negative    Past Medical History:  Diagnosis Date  . Colon polyp    three 2-17m polyps in descending colon, medium sized lipoma in sigmoid colon,  multiple 2071min recto-sigmoid colon  . [place], [place]    PFTs 12/2008, PFTs 6-15 minimal dz  . Coronary atherosclerosis of native coronary artery    Multivessel, stent LAD 1998, thrombectomy 2006, PTCA 2011, NSTEMI 6/12 managed medically;  LexiScan Myoview (10/14):  Difficult study, inferior scar with peri-infarct ischemia, inferior HK, EF 45%; low risk  . 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 Depression   . Full dentures   . Headache(784.0)   . Hyperlipidemia   . NSTEMI (non-ST elevated myocardial infarction) ([facility]  . Wears glasses     Past Surgical History:  Procedure Laterality Date  . APPENDECTOMY    . BACK SURGERY  1986  . BALLOON ANGIOPLASTY, ARTERY  04/2010   sp PTCA  . COMonette placed in the LAD  . CORONARY STENT PLACEMENT  02/2005   PCI/drug-eluting stent implantation mid-LAD  . INGUINAL HERNIA REPAIR     x3  . LUMBAR FUSION     x5 Dr. HeCorrin Parker. LUMBAR LAMINECTOMY  08/09/2013    L1  L2    Dr HiLuiz Ochoa. MASS EXCISION Left 03/09/2013   Procedure: EXCISION left chest wall MASS;  Surgeon: ThMarcello Moores. Cornett, MD;  [facility]: [facility];  Laterality: Left;  . NASAL FRACTURE SURGERY     x2  . 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 THROMBECTOMY  02/2005   acute  non ST segment elevation myocardial infarction  . TONSILLECTOMY      Social History   Social History  . Marital status: Widowed    Spouse name: N/A  . Number of children: 3  . Years of education: N/A   Occupational History  . retired from the [employer]     Social History Main Topics  . Smoking status: Former Smoker    Packs/day: 0.25    Years: 25.00    Types: Cigarettes    Quit date: 07/2016  . Smokeless tobacco: Never Used     Comment: quit 07-2016, 1/2 ppd  . Alcohol use 0.0 oz/week     Comment: Rarely   . Drug use: No     Comment:    . Sexual activity: Yes   Other Topics Concern  . Not on file   Social History Narrative   Lo47 son, lives w/ g-son and G-Jennell Corners62his wife       Family History  Problem Relation Age of Onset  . Heart attack Mother   . Lung cancer Father        +smoker  . Diabetes Other        GM, aunt, other memebers  . Prostate cancer  Neg Hx           . Colon cancer Neg Hx      Allergies as of 05/30/2017      Reactions   Acetaminophen    REACTION: tongue swells   Codeine    REACTION: Makes "him mean"   Hydrocodone-acetaminophen    REACTION: nausea, vomiting "makes me deathly sick"   Tetracycline    REACTION: loss of balance   Vicodin [hydrocodone-acetaminophen]       Medication List       Accurate as of 05/30/17 11:59 PM. Always use your most recent med list.          aspirin EC 325 MG tablet Take 1 tablet (325 mg total) by mouth daily.   MULTIVITAMIN ADULT PO Take 1 tablet by mouth daily. Heart Healthy   nitroGLYCERIN 0.4 MG SL tablet Commonly known as:  NITROSTAT Place 1 tablet (0.4 mg total) under the tongue every 5 (five) minutes as needed for chest pain. Then contact physician and get emergent medical care.   rosuvastatin 40 MG tablet Commonly known as:  CRESTOR Take 1 tablet (40 mg total) by mouth daily.   sertraline 50 MG tablet Commonly known as:  ZOLOFT Take 1 tablet (50 mg total) by mouth daily.     traZODone 150 MG tablet Commonly known as:  DESYREL Take 2 tablets (300 mg total) by mouth at bedtime as needed for sleep.            Discharge Care Instructions        Start     Ordered   05/30/17 0000  Comp Met (CMET)     05/30/17 1338   05/30/17 0000  Lipid panel     05/30/17 1338   05/30/17 0000  CBC w/Diff     05/30/17 1338   05/30/17 0000  TSH     05/30/17 1338   05/30/17 0000  Hepatitis C antibody     05/30/17 1338         Objective:   Physical Exam BP 126/68 (BP Location: Left Arm, Patient Position: Sitting, Cuff Size: Normal)   Pulse 63   Temp 97.8 F (36.6 C) (Oral)   Resp 14   Ht _0  (1.803 m)   Wt 207 lb 8 oz (94.1 kg)   SpO2 94%   BMI 28.94 kg/m   General:   Well developed, well nourished . NAD.  Neck: No  thyromegaly  HEENT:  Normocephalic . Face symmetric, atraumatic Lungs:  CTA B Normal respiratory effort, no intercostal retractions, no accessory muscle use. Heart: RRR,  no murmur.  No pretibial edema bilaterally  Abdomen:  Not distended, soft, non-tender. No rebound or rigidity.   Skin: Exposed areas without rash. Not pale. Not jaundice Neurologic:  alert & oriented X3.  Speech normal, gait appropriate for age and unassisted Strength symmetric and appropriate for age.  Psych: Cognition and judgment appear intact.  Cooperative with normal attention span and concentration.  Behavior appropriate. No anxious or depressed appearing.    Assessment & Plan:   Assessment prediabetes Hyperlipidemia COPD, last PFTs 2015 minimal disease Anxiety -depression- insomnia CAD Last OV w/ cards 06-2013 s/p prior LAD stenting 1998, thrombectomy 2006, PTCA 2011, NSTEMI 02/2011 ([place] 03/04/11: pLAD stent patent, mCFX stent patent, OM1 jailed by previously placed stent with ostial 60%-unchanged, chronically occluded RCA with bridging collaterals filling the distal vessel, EF 60%). Culprit for his NSTEMI at that time was unknown. It was hypothesized  that there  was thrombus formation that spontaneously resolved with heparin pre-catheterization. He was treated medically. 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 Echo 04/2010: EF 51-46%, grade 1 diastolic dysfunction, mild RVE, mild RAE. Patient lost to followup.  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 Tramadol,didn't  work well for him.Declined referral to pain mngmt 2016     PLAN Prediabetes: Diet control, last A1c 5.2. He likes to lose weight, recommend to check w/ the bariatric clinic. Information provided Hyperlipidemia: Continue Crestor, checking labs COPD: Essentially asx Anxiety depression and insomnia: Currently control, continue same medications. CAD: Asx, controlling CV RF Back pain: [facility]. RTC 6-8 months.

## 2017-05-30 NOTE — Assessment & Plan Note (Addendum)
-  Td 04-2016 ;  pneumonia shot 12-2009 ("make me sick" ); prevnar / Shingles shot: declines both today -CCS: cscope 11-09, hyperplastic polyps  Dr Oletta Lamas, over due for a cscope, see comments from last year, issue discussed again, not ready for a referral or cscope, understands risk of colon ca  -Prostate  ca screening: has declined DREs before and today, previous PSAs wnl  -Lung cancer screening discussed again, declined  -Diet- exercise discussed Labs:  CMP, FLP, CBC, TSH, hep C

## 2017-05-31 LAB — CBC WITH DIFFERENTIAL/PLATELET
BASOS ABS: 37 {cells}/uL (ref 0–200)
Basophils Relative: 0.5 %
EOS PCT: 1.9 %
Eosinophils Absolute: 139 cells/uL (ref 15–500)
HEMATOCRIT: 44.6 % (ref 38.5–50.0)
Hemoglobin: 14.9 g/dL (ref 13.2–17.1)
Lymphs Abs: 1241 cells/uL (ref 850–3900)
MCH: 31 pg (ref 27.0–33.0)
MCHC: 33.4 g/dL (ref 32.0–36.0)
MCV: 92.7 fL (ref 80.0–100.0)
MONOS PCT: 4.9 %
MPV: 9.6 fL (ref 7.5–12.5)
NEUTROS ABS: 5526 {cells}/uL (ref 1500–7800)
NEUTROS PCT: 75.7 %
Platelets: 217 10*3/uL (ref 140–400)
RBC: 4.81 10*6/uL (ref 4.20–5.80)
RDW: 12.8 % (ref 11.0–15.0)
Total Lymphocyte: 17 %
WBC mixed population: 358 cells/uL (ref 200–950)
WBC: 7.3 10*3/uL (ref 3.8–10.8)

## 2017-05-31 LAB — LIPID PANEL
CHOLESTEROL: 99 mg/dL (ref <200)
HDL: 52 mg/dL (ref >40)
LDL CHOLESTEROL (CALC): 33 mg/dL
Non-HDL Cholesterol (Calc): 47 mg/dL (calc) (ref <130)
Total CHOL/HDL Ratio: 1.9 (calc) (ref <5.0)
Triglycerides: 65 mg/dL (ref <150)

## 2017-05-31 LAB — COMPREHENSIVE METABOLIC PANEL
AG RATIO: 1.5 (calc) (ref 1.0–2.5)
ALT: 15 U/L (ref 9–46)
AST: 17 U/L (ref 10–35)
Albumin: 3.8 g/dL (ref 3.6–5.1)
Alkaline phosphatase (APISO): 77 U/L (ref 40–115)
BILIRUBIN TOTAL: 0.3 mg/dL (ref 0.2–1.2)
BUN: 11 mg/dL (ref 7–25)
CALCIUM: 8.9 mg/dL (ref 8.6–10.3)
CHLORIDE: 107 mmol/L (ref 98–110)
CO2: 24 mmol/L (ref 20–32)
Creat: 0.87 mg/dL (ref 0.70–1.18)
Globulin: 2.5 g/dL (calc) (ref 1.9–3.7)
Glucose, Bld: 95 mg/dL (ref 65–99)
Potassium: 4.8 mmol/L (ref 3.5–5.3)
SODIUM: 142 mmol/L (ref 135–146)
TOTAL PROTEIN: 6.3 g/dL (ref 6.1–8.1)

## 2017-05-31 LAB — HEPATITIS C ANTIBODY
Hepatitis C Ab: NONREACTIVE
SIGNAL TO CUT-OFF: 0.01 (ref <1.00)

## 2017-05-31 LAB — TSH: TSH: 0.49 m[IU]/L (ref 0.40–4.50)

## 2017-06-01 NOTE — Assessment & Plan Note (Signed)
Prediabetes: Diet control, last A1c 5.2. He likes to lose weight, recommend to check w/ the bariatric clinic. Information provided Hyperlipidemia: Continue Crestor, checking labs COPD: Essentially asx Anxiety depression and insomnia: Currently control, continue same medications. CAD: Asx, controlling CV RF Back pain: [facility], results [facility]. RTC 6-8 months.

## 2017-06-09 MED ORDER — ROSUVASTATIN CALCIUM 40 MG PO TABS: 20.0000 mg | ORAL_TABLET | Freq: Every day | ORAL | 2 refills | Status: DC

## 2017-06-09 NOTE — Addendum Note (Signed)
Addended byDamita Dunnings D on: 06/09/2017 02:47 PM   Modules accepted: Orders

## 2017-11-25 NOTE — Progress Notes (Deleted)
Subjective:   Ricardo TURMAN Sr. is a 72 y.o. male who presents for Medicare Annual/Subsequent preventive examination.  Review of Systems: No ROS.  Medicare Wellness Visit. Additional risk factors are reflected in the social history.    Sleep patterns:   Home Safety/Smoke Alarms: Feels safe in home. Smoke alarms in place.  Living environment; residence and Firearm Safety:  [place] Safety/Bike Helmet: Wears seat belt.  Male:   CCS-     PSA-  Lab Results  Component Value Date   PSA 0.4 05/24/2016   PSA 0.60 02/22/2014   PSA 0.77 01/06/2012       Objective:    Vitals: There were no vitals taken for this visit.  There is no height or weight on file to calculate BMI.  Advanced Directives 08/09/2013 08/09/2013 07/30/2013 06/30/2013 03/03/2013 03/23/2012  Does Patient Have a Medical Advance Directive? Patient does not have advance directive - Patient does not have advance directive;Patient would not like information Patient does not have advance directive Patient does not have advance directive Patient does not have advance directive;Patient would like information  Would patient like information on creating a medical advance directive? - - - - - Advance directive packet given  Pre-existing out of facility DNR order (yellow form or pink MOST form) - No - No - No    Tobacco Social History   Tobacco Use  Smoking Status Former Smoker  . Packs/day: 0.25  . Years: 25.00  . Pack years: 6.25  . Types: Cigarettes  . Last attempt to quit: 07/2016  . Years since quitting: 1.3  Smokeless Tobacco Never Used  Tobacco Comment   quit 07-2016, 1/2 ppd     Counseling given: Not Answered Comment: quit 07-2016, 1/2 ppd   Clinical Intake:                       Past Medical History:  Diagnosis Date  . Colon polyp    three 2-82mm polyps in descending colon, medium sized lipoma in sigmoid colon,  multiple 218mm in recto-sigmoid colon  . COPD, mild ([place])     PFTs 12/2008,  PFTs 6-15 minimal dz  . Coronary atherosclerosis of native coronary artery    Multivessel, stent LAD 1998, thrombectomy 2006, PTCA 2011, NSTEMI 6/12 managed medically;  LexiScan Myoview (10/14):  Difficult study, inferior scar with peri-infarct ischemia, inferior HK, EF 45%; low risk  . 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 Depression   . Full dentures   . Headache(784.0)   . Hyperlipidemia   . NSTEMI (non-ST elevated myocardial infarction) ([place]) 02-2011  . Wears glasses    Past Surgical History:  Procedure Laterality Date  . APPENDECTOMY    . BACK SURGERY  1986  . BALLOON ANGIOPLASTY, ARTERY  04/2010   sp PTCA  . [place]   placed in the LAD  . CORONARY STENT PLACEMENT  02/2005   PCI/drug-eluting stent implantation mid-LAD  . INGUINAL HERNIA REPAIR     x3  . LUMBAR FUSION     x5 Dr. Corrin Parker  . LUMBAR LAMINECTOMY  08/09/2013    L1  L2    Dr Luiz Ochoa  . MASS EXCISION Left 03/09/2013   Procedure: EXCISION left chest wall MASS;  Surgeon: Marcello Moores A. Cornett, MD;  Location: [facility];  Service: General;  Laterality: Left;  . NASAL FRACTURE  SURGERY     x2  . 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 THROMBECTOMY  02/2005   acute non ST segment elevation myocardial infarction  . TONSILLECTOMY     Family History  Problem Relation Age of Onset  . Heart attack Mother   . Lung cancer Father        +smoker  . Diabetes Other        GM, aunt, other memebers  . Prostate cancer Neg Hx           . Colon cancer Neg Hx    Social History   Socioeconomic History  . Marital status: Widowed    Spouse name: Not on file  . Number of children: 3  . Years of education: Not on file  . Highest education level: Not on file  Social Needs  . Financial resource strain: Not on file  . Food insecurity - worry: Not on file  . Food insecurity - inability: Not on file    . Transportation needs - medical: Not on file  . Transportation needs - non-medical: Not on file  Occupational History  . Occupation: retired from the rail [employer]   Tobacco Use  . Smoking status: Former Smoker    Packs/day: 0.25    Years: 25.00    Pack years: 6.25    Types: Cigarettes    Last attempt to quit: 07/2016    Years since quitting: 1.3  . Smokeless tobacco: Never Used  . Tobacco comment: quit 07-2016, 1/2 ppd  Substance and Sexual Activity  . Alcohol use: Yes    Alcohol/week: 0.0 oz    Comment: Rarely   . Drug use: No    Comment:    . Sexual activity: Yes  Other Topics Concern  . Not on file  Social History Narrative   Lost 1 son, lives w/ g-son and Jennell Corner 89  his wife      Outpatient Encounter Medications as of 11/28/2017  Medication Sig  . aspirin EC 325 MG tablet Take 1 tablet (325 mg total) by mouth daily.  . Multiple Vitamins-Minerals (MULTIVITAMIN ADULT PO) Take 1 tablet by mouth daily. Heart Healthy  . nitroGLYCERIN (NITROSTAT) 0.4 MG SL tablet Place 1 tablet (0.4 mg total) under the tongue every 5 (five) minutes as needed for chest pain. Then contact physician and get emergent medical care. (Patient not taking: Reported on 11/22/2016)  . rosuvastatin (CRESTOR) 40 MG tablet Take 0.5 tablets (20 mg total) by mouth daily.  . sertraline (ZOLOFT) 50 MG tablet Take 1 tablet (50 mg total) by mouth daily.  . traZODone (DESYREL) 150 MG tablet Take 2 tablets (300 mg total) by mouth at bedtime as needed for sleep.   No facility-administered encounter medications on file as of 11/28/2017.     Activities of Daily Living In your present state of health, do you have any difficulty performing the following activities: 05/30/2017  Hearing? N  Vision? N  Difficulty concentrating or making decisions? N  Walking or climbing stairs? N  Dressing or bathing? N  Doing errands, shopping? N  Some recent data might be hidden    Patient Care Team: Colon Branch, MD as PCP -  General Joaquim Lai, Lowella Fairy, PA-C as Referring Physician (Physician Assistant) Suella Broad, MD as Consulting Physician (Physical Medicine and Rehabilitation)   Assessment:   This is a routine wellness examination for [name]. Physical assessment deferred to PCP.  Exercise Activities and Dietary recommendations  Diet (meal preparation, eat out, water intake, caffeinated beverages, dairy products, fruits and vegetables): {Desc; diets:16563} Breakfast: Lunch:  Dinner:      Goals    None      Fall Risk Fall Risk  05/30/2017 05/24/2016 11/22/2015 05/15/2015 02/22/2014  Falls in the past year? No No No No No    Depression Screen PHQ 2/9 Scores 05/30/2017 05/24/2016 11/22/2015 05/15/2015  PHQ - 2 Score 0 0 0 0  PHQ- 9 Score 0 - - -    Cognitive Function        Immunization History  Administered Date(s) Administered  . Influenza Split 08/29/2011, 08/07/2012, 08/23/2014  . Influenza Whole 06/27/2010  . Influenza,inj,Quad PF,6+ Mos 08/17/2013, 05/24/2016  . Influenza-Unspecified 07/25/2015  . Pneumococcal Polysaccharide-23 12/26/2009  . Td 09/23/2005, 05/24/2016    Screening Tests Health Maintenance  Topic Date Due  . PNA vac Low Risk Adult (1 of 2 - PCV13) 01/01/2011  . INFLUENZA VACCINE  04/23/2017  . COLONOSCOPY  12/06/2019  . TETANUS/TDAP  05/24/2026  . Hepatitis C Screening  Completed       Plan:   ***  I have personally reviewed and noted the following in the patient's chart:   . Medical and social history . Use of alcohol, tobacco or illicit drugs  . Current medications and supplements . Functional ability and status . Nutritional status . Physical activity . Advanced directives . List of other physicians . Hospitalizations, surgeries, and ER visits in previous 12 months . Vitals . Screenings to include cognitive, depression, and falls . Referrals and appointments  In addition, I have reviewed and discussed with patient certain preventive protocols, quality  metrics, and best practice recommendations. A written personalized care plan for preventive services as well as general preventive health recommendations were provided to patient.     Shela Nevin, [place]  11/25/2017

## 2017-11-28 ENCOUNTER — Ambulatory Visit: Payer: Medicare HMO

## 2017-11-28 ENCOUNTER — Ambulatory Visit (INDEPENDENT_AMBULATORY_CARE_PROVIDER_SITE_OTHER): Payer: Medicare HMO | Admitting: Internal Medicine

## 2017-11-28 ENCOUNTER — Encounter: Payer: Self-pay | Admitting: Internal Medicine

## 2017-11-28 ENCOUNTER — Ambulatory Visit: Payer: Medicare HMO | Admitting: *Deleted

## 2017-11-28 VITALS — BP 126/68 | HR 65 | Temp 97.8°F | Resp 14 | Ht 71.0 in | Wt 195.1 lb

## 2017-11-28 DIAGNOSIS — J449 Chronic obstructive pulmonary disease, unspecified: Secondary | ICD-10-CM | POA: Diagnosis not present

## 2017-11-28 DIAGNOSIS — F329 Major depressive disorder, single episode, unspecified: Secondary | ICD-10-CM

## 2017-11-28 DIAGNOSIS — F419 Anxiety disorder, unspecified: Secondary | ICD-10-CM | POA: Diagnosis not present

## 2017-11-28 DIAGNOSIS — E785 Hyperlipidemia, unspecified: Secondary | ICD-10-CM

## 2017-11-28 LAB — LIPID PANEL
Cholesterol: 100 mg/dL (ref 0–200)
HDL: 49.3 mg/dL (ref >39.00)
LDL CALC: 31 mg/dL (ref 0–99)
NONHDL: 50.58
Total CHOL/HDL Ratio: 2
Triglycerides: 98 mg/dL (ref 0.0–149.0)
VLDL: 19.6 mg/dL (ref 0.0–40.0)

## 2017-11-28 MED ORDER — TRAZODONE HCL 150 MG PO TABS: 300.0000 mg | ORAL_TABLET | Freq: Every evening | ORAL | 0 refills | Status: DC | PRN

## 2017-11-28 MED ORDER — TRAZODONE HCL 150 MG PO TABS: 300.0000 mg | ORAL_TABLET | Freq: Every evening | ORAL | 1 refills | Status: DC | PRN

## 2017-11-28 MED FILL — traZODone HCL 150 MG TABS: 150 | 30 days supply | Qty: 60 | Fill #0

## 2017-11-28 NOTE — Progress Notes (Signed)
Subjective:    Patient ID: Ricardo Palmer Sr., male    DOB: 1946/03/05, 72 y.o.   MRN: [id number]  DOS:  11/28/2017 Type of visit - description : rov Interval history: No major concerns. MSK pain at baseline Good compliance with Crestor Ran out 4 days ago, not sleeping well.   Review of Systems Denies chest pain, lower extremity edema Has mild cough with rare sputum production.  No wheezing  Past Medical History:  Diagnosis Date  . Colon polyp    three 2-17mm polyps in descending colon, medium sized lipoma in sigmoid colon,  multiple 234mm in recto-sigmoid colon  . COPD, mild ([place])     PFTs 12/2008, PFTs 6-15 minimal dz  . Coronary atherosclerosis of native coronary artery    Multivessel, stent LAD 1998, thrombectomy 2006, PTCA 2011, NSTEMI 6/12 managed medically;  LexiScan Myoview (10/14):  Difficult study, inferior scar with peri-infarct ischemia, inferior HK, EF 45%; low risk  . 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 Depression   . Full dentures   . Headache(784.0)   . Hyperlipidemia   . NSTEMI (non-ST elevated myocardial infarction) ([place]) 02-2011  . Wears glasses     Past Surgical History:  Procedure Laterality Date  . APPENDECTOMY    . BACK SURGERY  1986  . BALLOON ANGIOPLASTY, ARTERY  04/2010   sp PTCA  . [place]   placed in the LAD  . CORONARY STENT PLACEMENT  02/2005   PCI/drug-eluting stent implantation mid-LAD  . INGUINAL HERNIA REPAIR     x3  . LUMBAR FUSION     x5 Dr. Corrin Parker  . LUMBAR LAMINECTOMY  08/09/2013    L1  L2    Dr Luiz Ochoa  . MASS EXCISION Left 03/09/2013   Procedure: EXCISION left chest wall MASS;  Surgeon: Marcello Moores A. Cornett, MD;  Location: [facility];  Service: General;  Laterality: Left;  . NASAL FRACTURE SURGERY     x2  . 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 THROMBECTOMY  02/2005   acute non ST segment  elevation myocardial infarction  . TONSILLECTOMY      Social History   Socioeconomic History  . Marital status: Widowed    Spouse name: Not on file  . Number of children: 3  . Years of education: Not on file  . Highest education level: Not on file  Social Needs  . Financial resource strain: Not on file  . Food insecurity - worry: Not on file  . Food insecurity - inability: Not on file  . Transportation needs - medical: Not on file  . Transportation needs - non-medical: Not on file  Occupational History  . Occupation: retired from the rail [employer]   Tobacco Use  . Smoking status: Former Smoker    Packs/day: 0.25    Years: 25.00    Pack years: 6.25    Types: Cigarettes    Last attempt to quit: 07/2016    Years since quitting: 1.3  . Smokeless tobacco: Never Used  . Tobacco comment: quit 07-2016, 1/2 ppd  Substance and Sexual Activity  . Alcohol use: Yes    Alcohol/week: 0.0 oz    Comment: Rarely   . Drug use: No    Comment:    . Sexual activity: Yes  Other Topics Concern  . Not on file  Social  History Narrative   Lost 1 son, lives w/ g-son and Jennell Corner 's his wife        Allergies as of 11/28/2017      Reactions   Acetaminophen    REACTION: tongue swells   Codeine    REACTION: Makes "him mean"   Hydrocodone-acetaminophen    REACTION: nausea, vomiting "makes me deathly sick"   Tetracycline    REACTION: loss of balance   Vicodin [hydrocodone-acetaminophen]       Medication List        Accurate as of 11/28/17  5:11 PM. Always use your most recent med list.          aspirin EC 325 MG tablet Take 1 tablet (325 mg total) by mouth daily.   nitroGLYCERIN 0.4 MG SL tablet Commonly known as:  NITROSTAT Place 1 tablet (0.4 mg total) under the tongue every 5 (five) minutes as needed for chest pain. Then contact physician and get emergent medical care.   rosuvastatin 40 MG tablet Commonly known as:  CRESTOR Take 0.5 tablets (20 mg total) by mouth daily.   sertraline  50 MG tablet Commonly known as:  ZOLOFT Take 1 tablet (50 mg total) by mouth daily.   traZODone 150 MG tablet Commonly known as:  DESYREL Take 2 tablets (300 mg total) by mouth at bedtime as needed for sleep.          Objective:   Physical Exam BP 126/68 (BP Location: Left Arm, Patient Position: Sitting, Cuff Size: Small)   Pulse 65   Temp 97.8 F (36.6 C) (Oral)   Resp 14   Ht 5\' 11"  (1.803 m)   Wt 195 lb 2 oz (88.5 kg)   SpO2 92%   BMI 27.21 kg/m  General:   Well developed, well nourished . NAD.  HEENT:  Normocephalic . Face symmetric, atraumatic Lungs:  CTA B Normal respiratory effort, no intercostal retractions, no accessory muscle use. Heart: RRR,  no murmur.  No pretibial edema bilaterally  Skin: Not pale. Not jaundice Neurologic:  alert & oriented X3.  Speech normal, gait appropriate for age and unassisted Psych--  Cognition and judgment appear intact.  Cooperative with normal attention span and concentration.  Behavior appropriate. No anxious or depressed appearing.      Assessment & Plan:   Assessment prediabetes Hyperlipidemia COPD, last PFTs 2015 minimal disease Anxiety -depression- insomnia CAD Last OV w/ cards 06-2013 s/p prior LAD stenting 1998, thrombectomy 2006, PTCA 2011, NSTEMI 02/2011 ([place] 03/04/11: pLAD stent patent, mCFX stent patent, OM1 jailed by previously placed stent with ostial 60%-unchanged, chronically occluded RCA with bridging collaterals filling the distal vessel, EF 60%). Culprit for his NSTEMI at that time was unknown. It was hypothesized that there was thrombus formation that spontaneously resolved with heparin pre-catheterization. He was treated medically. 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 Echo 04/2010: EF 84-13%, grade 1 diastolic dysfunction, mild RVE, mild RAE. Patient lost to followup.  Stress test 06-2013: poor quality but apparently unchanged from previous CVA 2000, 2001, due to emboli  from mitral wall vegetation MSK:  DJD, chronic back-neck, hips Dr Nelva Bush h/o  back surgery 2014, chronic pain unable to take Tylenol or hydrocodone. Tramadol,didn't  work well for him.Declined referral to pain mngmt 2016     PLAN Hyperlipidemia: Last LDL was excellent, Crestor decreased to 20 mg daily.  Check a FLP. COPD: Basically no sxs, doing well. CAD: Asx Anxiety, depression, insomnia: Well-controlled on sertraline however  run out of trazodone 4 days ago, states that his long-term pharmacy has not send his medication.  Last refill was sent  last year thus a 90 days RF sent today plus provided a paper Rx so he doesn't stop the medication abruptly   RTC 6 months CPX

## 2017-11-28 NOTE — Progress Notes (Signed)
Pre visit review using our clinic review tool, if applicable. No additional management support is needed unless otherwise documented below in the visit note. 

## 2017-11-28 NOTE — Patient Instructions (Signed)
GO TO THE LAB : Get the blood work     GO TO THE FRONT DESK Schedule your next appointment for a physical exam in 6 months, fasting  Please do not run out of trazodone, try to get a month supply from a local pharmacy.  We will call [facility]

## 2017-11-28 NOTE — Assessment & Plan Note (Signed)
Hyperlipidemia: Last LDL was excellent, Crestor decreased to 20 mg daily.  Check a FLP. COPD: Basically no sxs, doing well. CAD: Asx Anxiety, depression, insomnia: Well-controlled on sertraline however run out of trazodone 4 days ago, states that his long-term pharmacy has not send his medication.  Last refill was sent  last year thus a 90 days RF sent today plus provided a paper Rx so he doesn't stop the medication abruptly   RTC 6 months CPX

## 2018-04-17 ENCOUNTER — Other Ambulatory Visit: Payer: Self-pay | Admitting: Internal Medicine

## 2018-06-01 ENCOUNTER — Ambulatory Visit: Payer: Medicare HMO | Admitting: Internal Medicine

## 2018-06-26 ENCOUNTER — Other Ambulatory Visit: Payer: Self-pay | Admitting: Internal Medicine

## 2018-06-26 DIAGNOSIS — E785 Hyperlipidemia, unspecified: Secondary | ICD-10-CM

## 2018-07-07 ENCOUNTER — Encounter: Payer: Self-pay | Admitting: Internal Medicine

## 2018-08-03 ENCOUNTER — Ambulatory Visit (INDEPENDENT_AMBULATORY_CARE_PROVIDER_SITE_OTHER): Payer: Medicare HMO | Admitting: Internal Medicine

## 2018-08-03 ENCOUNTER — Encounter: Payer: Self-pay | Admitting: Internal Medicine

## 2018-08-03 VITALS — BP 128/78 | HR 80 | Temp 98.1°F | Resp 16 | Ht 71.0 in | Wt 197.1 lb

## 2018-08-03 DIAGNOSIS — F419 Anxiety disorder, unspecified: Secondary | ICD-10-CM

## 2018-08-03 DIAGNOSIS — Z86010 Personal history of colonic polyps: Secondary | ICD-10-CM

## 2018-08-03 DIAGNOSIS — I2510 Atherosclerotic heart disease of native coronary artery without angina pectoris: Secondary | ICD-10-CM

## 2018-08-03 DIAGNOSIS — Z23 Encounter for immunization: Secondary | ICD-10-CM

## 2018-08-03 DIAGNOSIS — F329 Major depressive disorder, single episode, unspecified: Secondary | ICD-10-CM | POA: Diagnosis not present

## 2018-08-03 DIAGNOSIS — Z9114 Patient's other noncompliance with medication regimen: Secondary | ICD-10-CM

## 2018-08-03 DIAGNOSIS — F17200 Nicotine dependence, unspecified, uncomplicated: Secondary | ICD-10-CM | POA: Diagnosis not present

## 2018-08-03 DIAGNOSIS — W19XXXA Unspecified fall, initial encounter: Secondary | ICD-10-CM | POA: Diagnosis not present

## 2018-08-03 DIAGNOSIS — G4700 Insomnia, unspecified: Secondary | ICD-10-CM | POA: Diagnosis not present

## 2018-08-03 LAB — COMPREHENSIVE METABOLIC PANEL
ALT: 14 U/L (ref 0–53)
AST: 16 U/L (ref 0–37)
Albumin: 3.9 g/dL (ref 3.5–5.2)
Alkaline Phosphatase: 78 U/L (ref 39–117)
BUN: 14 mg/dL (ref 6–23)
CALCIUM: 9 mg/dL (ref 8.4–10.5)
CHLORIDE: 106 meq/L (ref 96–112)
CO2: 30 meq/L (ref 19–32)
CREATININE: 0.97 mg/dL (ref 0.40–1.50)
GFR: 80.73 mL/min (ref >60.00)
GLUCOSE: 104 mg/dL — AB (ref 70–99)
Potassium: 4.5 mEq/L (ref 3.5–5.1)
Sodium: 142 mEq/L (ref 135–145)
Total Bilirubin: 0.3 mg/dL (ref 0.2–1.2)
Total Protein: 6.4 g/dL (ref 6.0–8.3)

## 2018-08-03 LAB — CBC WITH DIFFERENTIAL/PLATELET
BASOS PCT: 0.8 % (ref 0.0–3.0)
Basophils Absolute: 0.1 10*3/uL (ref 0.0–0.1)
Eosinophils Absolute: 0.2 10*3/uL (ref 0.0–0.7)
Eosinophils Relative: 2.7 % (ref 0.0–5.0)
HEMATOCRIT: 43.3 % (ref 39.0–52.0)
Hemoglobin: 14.6 g/dL (ref 13.0–17.0)
LYMPHS ABS: 1 10*3/uL (ref 0.7–4.0)
LYMPHS PCT: 16.3 % (ref 12.0–46.0)
MCHC: 33.7 g/dL (ref 30.0–36.0)
MCV: 93.9 fl (ref 78.0–100.0)
MONOS PCT: 5.7 % (ref 3.0–12.0)
Monocytes Absolute: 0.3 10*3/uL (ref 0.1–1.0)
NEUTROS ABS: 4.6 10*3/uL (ref 1.4–7.7)
Neutrophils Relative %: 74.5 % (ref 43.0–77.0)
PLATELETS: 223 10*3/uL (ref 150.0–400.0)
RBC: 4.61 Mil/uL (ref 4.22–5.81)
RDW: 13.7 % (ref 11.5–15.5)
WBC: 6.1 10*3/uL (ref 4.0–10.5)

## 2018-08-03 NOTE — Patient Instructions (Signed)
GO TO THE LAB : Get the blood work     GO TO THE FRONT DESK Schedule your next appointment for a physical exam in 4 to 5 months, fasting  Fall Prevention in the Home Falls can cause injuries and can affect people from all age groups. There are many simple things that you can do to make your home safe and to help prevent falls. What can I do on the outside of my home?  Regularly repair the edges of walkways and driveways and fix any cracks.  Remove high doorway thresholds.  Trim any shrubbery on the main path into your home.  Use bright outdoor lighting.  Clear walkways of debris and clutter, including tools and rocks.  Regularly check that handrails are securely fastened and in good repair. Both sides of any steps should have handrails.  Install guardrails along the edges of any raised decks or porches.  Have leaves, snow, and ice cleared regularly.  Use sand or salt on walkways during winter months.  In the garage, clean up any spills right away, including grease or oil spills. What can I do in the bathroom?  Use night lights.  Install grab bars by the toilet and in the tub and shower. Do not use towel bars as grab bars.  Use non-skid mats or decals on the floor of the tub or shower.  If you need to sit down while you are in the shower, use a plastic, non-slip stool.  Keep the floor dry. Immediately clean up any water that spills on the floor.  Remove soap buildup in the tub or shower on a regular basis.  Attach bath mats securely with double-sided non-slip rug tape.  Remove throw rugs and other tripping hazards from the floor. What can I do in the bedroom?  Use night lights.  Make sure that a bedside light is easy to reach.  Do not use oversized bedding that drapes onto the floor.  Have a firm chair that has side arms to use for getting dressed.  Remove throw rugs and other tripping hazards from the floor. What can I do in the kitchen?  Clean up any spills  right away.  Avoid walking on wet floors.  Place frequently used items in easy-to-reach places.  If you need to reach for something above you, use a sturdy step stool that has a grab bar.  Keep electrical cables out of the way.  Do not use floor polish or wax that makes floors slippery. If you have to use wax, make sure that it is non-skid floor wax.  Remove throw rugs and other tripping hazards from the floor. What can I do in the stairways?  Do not leave any items on the stairs.  Make sure that there are handrails on both sides of the stairs. Fix handrails that are broken or loose. Make sure that handrails are as long as the stairways.  Check any carpeting to make sure that it is firmly attached to the stairs. Fix any carpet that is loose or worn.  Avoid having throw rugs at the top or bottom of stairways, or secure the rugs with carpet tape to prevent them from moving.  Make sure that you have a light switch at the top of the stairs and the bottom of the stairs. If you do not have them, have them installed. What are some other fall prevention tips?  Wear closed-toe shoes that fit well and support your feet. Wear shoes that have rubber  soles or low heels.  When you use a stepladder, make sure that it is completely opened and that the sides are firmly locked. Have someone hold the ladder while you are using it. Do not climb a closed stepladder.  Add color or contrast paint or tape to grab bars and handrails in your home. Place contrasting color strips on the first and last steps.  Use mobility aids as needed, such as canes, walkers, scooters, and crutches.  Turn on lights if it is dark. Replace any light bulbs that burn out.  Set up furniture so that there are clear paths. Keep the furniture in the same spot.  Fix any uneven floor surfaces.  Choose a carpet design that does not hide the edge of steps of a stairway.  Be aware of any and all pets.  Review your medicines  with your healthcare provider. Some medicines can cause dizziness or changes in blood pressure, which increase your risk of falling. Talk with your health care provider about other ways that you can decrease your risk of falls. This may include working with a physical therapist or trainer to improve your strength, balance, and endurance. This information is not intended to replace advice given to you by your health care provider. Make sure you discuss any questions you have with your health care provider. Document Released: 08/30/2002 Document Revised: 02/06/2016 Document Reviewed: 10/14/2014 Elsevier Interactive Patient Education  [employer].

## 2018-08-03 NOTE — Progress Notes (Signed)
Pre visit review using our clinic review tool, if applicable. No additional management support is needed unless otherwise documented below in the visit note. 

## 2018-08-03 NOTE — Progress Notes (Signed)
Subjective:    Patient ID: Ricardo Palmer Sr., male    DOB: 1946/06/29, 72 y.o.   MRN: [id number]  DOS:  08/03/2018 Type of visit - description : ROV Interval history: He is here with his grandson Corene Cornea who has several concerns The patient wakes up at 10 amm to 12 noon, he is not active at all, spends the rest of his day sitting on his chair. A couple of weeks ago however he decided to do something in his roof, climb to a ladder, fell off from about 12 feet high. No LOC, no obvious injury.  Currently denies any new neck or back pain.  Left hip pain is slightly sore. He is not smoking anymore but now he is chewing tobacco. He takes trazodone and occasional Tylenol PM for insomnia, Corene Cornea reports that a couple of hours after he takes those medicines he seems "high",  confused? He has a hard time describing how he looks. There is no oversedation the following day The patient does not share Jason's concerns.   Review of Systems Denies chest pain, no difficulty breathing, no DOE although the patient is not active No nausea, vomiting, diarrhea No dysuria or gross hematuria No anxiety or depression.  Past Medical History:  Diagnosis Date  . Colon polyp    three 2-76mm polyps in descending colon, medium sized lipoma in sigmoid colon,  multiple 226mm in recto-sigmoid colon  . COPD, mild ([place])     PFTs 12/2008, PFTs 6-15 minimal dz  . Coronary atherosclerosis of native coronary artery    Multivessel, stent LAD 1998, thrombectomy 2006, PTCA 2011, NSTEMI 6/12 managed medically;  LexiScan Myoview (10/14):  Difficult study, inferior scar with peri-infarct ischemia, inferior HK, EF 45%; low risk  . 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 Depression   . Full dentures   . Headache(784.0)   . Hyperlipidemia   . NSTEMI (non-ST elevated myocardial infarction) ([place]) 02-2011  . Wears glasses     Past Surgical  History:  Procedure Laterality Date  . APPENDECTOMY    . BACK SURGERY  1986  . BALLOON ANGIOPLASTY, ARTERY  04/2010   sp PTCA  . [place]   placed in the LAD  . CORONARY STENT PLACEMENT  02/2005   PCI/drug-eluting stent implantation mid-LAD  . INGUINAL HERNIA REPAIR     x3  . LUMBAR FUSION     x5 Dr. Corrin Parker  . LUMBAR LAMINECTOMY  08/09/2013    L1  L2    Dr Luiz Ochoa  . MASS EXCISION Left 03/09/2013   Procedure: EXCISION left chest wall MASS;  Surgeon: Marcello Moores A. Cornett, MD;  Location: [facility];  Service: General;  Laterality: Left;  . NASAL FRACTURE SURGERY     x2  . 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 THROMBECTOMY  02/2005   acute non ST segment elevation myocardial infarction  . TONSILLECTOMY      Social History   Socioeconomic History  . Marital status: Widowed    Spouse name: Not on file  . Number of children: 3  . Years of education: Not on file  . Highest education level: Not on file  Occupational History  . Occupation: retired from the rail [employer]   Social Needs  . Financial resource strain: Not on file  . Food insecurity:    Worry: Not  on file    Inability: Not on file  . Transportation needs:    Medical: Not on file    Non-medical: Not on file  Tobacco Use  . Smoking status: Former Smoker    Packs/day: 0.25    Years: 25.00    Pack years: 6.25    Types: Cigarettes    Last attempt to quit: 07/2016    Years since quitting: 2.0  . Smokeless tobacco: Never Used  . Tobacco comment: quit 07-2016, 1/2 ppd  Substance and Sexual Activity  . Alcohol use: Yes    Alcohol/week: 0.0 standard drinks    Comment: Rarely   . Drug use: No    Comment:    . Sexual activity: Yes  Lifestyle  . Physical activity:    Days per week: Not on file    Minutes per session: Not on file  . Stress: Not on file  Relationships  . Social connections:    Talks on phone: Not on file    Gets together: Not on file    Attends religious  service: Not on file    Active member of club or organization: Not on file    Attends meetings of clubs or organizations: Not on file    Relationship status: Not on file  . Intimate partner violence:    Fear of current or ex partner: Not on file    Emotionally abused: Not on file    Physically abused: Not on file    Forced sexual activity: Not on file  Other Topics Concern  . Not on file  Social History Narrative   Lost 1 son, lives w/ g-son and Jennell Corner 's his wife        Allergies as of 08/03/2018      Reactions   Acetaminophen    REACTION: tongue swells   Codeine    REACTION: Makes "him mean"   Hydrocodone-acetaminophen    REACTION: nausea, vomiting "makes me deathly sick"   Tetracycline    REACTION: loss of balance   Vicodin [hydrocodone-acetaminophen]       Medication List        Accurate as of 08/03/18 11:18 AM. Always use your most recent med list.          aspirin EC 325 MG tablet Take 1 tablet (325 mg total) by mouth daily.   nitroGLYCERIN 0.4 MG SL tablet Commonly known as:  NITROSTAT Place 1 tablet (0.4 mg total) under the tongue every 5 (five) minutes as needed for chest pain. Then contact physician and get emergent medical care.   rosuvastatin 40 MG tablet Commonly known as:  CRESTOR Take 0.5 tablets (20 mg total) by mouth daily.   sertraline 50 MG tablet Commonly known as:  ZOLOFT Take 1 tablet (50 mg total) by mouth daily.   traZODone 150 MG tablet Commonly known as:  DESYREL Take 2 tablets (300 mg total) by mouth at bedtime as needed for sleep.          Objective:   Physical Exam BP 128/78 (BP Location: Left Arm, Patient Position: Sitting, Cuff Size: Small)   Pulse 80   Temp 98.1 F (36.7 C) (Oral)   Resp 16   Ht 5\' 11"  (1.803 m)   Wt 197 lb 2 oz (89.4 kg)   SpO2 91%   BMI 27.49 kg/m    General:   Well developed, NAD, looks older that his stated age, BMI noted.  HEENT:  Normocephalic . Face symmetric, atraumatic Lungs:  Slightly decreased breath sounds Normal respiratory effort, no intercostal retractions, no accessory muscle use. Heart: RRR,  no murmur.  no pretibial edema bilaterally  Abdomen:  Not distended, soft, non-tender. No rebound or rigidity.  No bruit. Skin: Not pale. Not jaundice Neurologic:  alert & oriented X3.  Speech normal, gait appropriate for age and assisted by a cane  Psych--  Cognition and judgment appear intact.  Cooperative with normal attention span and concentration.  Behavior appropriate. No anxious or depressed appearing.     Assessment & Plan:  Assessment prediabetes Hyperlipidemia COPD, last PFTs 2015 minimal disease Anxiety -depression- insomnia CAD Last OV w/ cards 06-2013 s/p prior LAD stenting 1998, thrombectomy 2006, PTCA 2011, NSTEMI 02/2011 ([place] 03/04/11: pLAD stent patent, mCFX stent patent, OM1 jailed by previously placed stent with ostial 60%-unchanged, chronically occluded RCA with bridging collaterals filling the distal vessel, EF 60%). Culprit for his NSTEMI at that time was unknown. It was hypothesized that there was thrombus formation that spontaneously resolved with heparin pre-catheterization. He was treated medically. 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 Echo 04/2010: EF 50-03%, grade 1 diastolic dysfunction, mild RVE, mild RAE. Patient lost to followup.  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 Tramadol,didn't  work well for him.Declined referral to pain mngmt 2016     PLAN Hyperlipidemia:Continue Crestor, last FLP satisfactory Anxiety, depression, insomnia: He denies active symptoms, he takes zoloft and trazodone at bedtime which works for insomnia.   See comments under HPI, the patient's grandson reports  that he looks "high" after he takes trazodone, but he could not describe that any better. The pt himself reports he simply feels sleepy, denies taking any drugs other than what he is prescribed and Tylenol PM.  I suggested change trazodone to something else but the patient  strongly declined.  Recommend close observation for now. Tobacco abuse: Patient is chewing tobacco, counseled. Physical inactivity, recent fall: Strongly advised him about fall prevention and potential catastrophic consequences if he falls again; see AVS.  I also advising about the benefits on physical activity, socialize more and spending more time with family and friends. Preventive care: Flu shot provided today. Due for a colonoscopy, he is thinking about it but declined a referral. RTC 4 to 5 months, CPX fasting.   Today, I spent more than  30  min with the patient: >50% of the time counseling regards fall prevention, risks of inactivity, need to socialize, see above

## 2018-08-04 NOTE — Assessment & Plan Note (Signed)
Hyperlipidemia:Continue Crestor, last FLP satisfactory Anxiety, depression, insomnia: He denies active symptoms, he takes zoloft and trazodone at bedtime which works for insomnia.   See comments under HPI, the patient's grandson reports that he looks "high" after he takes trazodone, but he could not describe that any better. The pt himself reports he simply feels sleepy, denies taking any drugs other than what he is prescribed and Tylenol PM.  I suggested change trazodone to something else but the patient  strongly declined.  Recommend close observation for now. Tobacco abuse: Patient is chewing tobacco, counseled. Physical inactivity, recent fall: Strongly advised him about fall prevention and potential catastrophic consequences if he falls again; see AVS.  I also advising about the benefits on physical activity, socialize more and spending more time with family and friends. Preventive care: Flu shot provided today. Due for a colonoscopy, he is thinking about it but declined a referral. RTC 4 to 5 months, CPX fasting.   Today, I spent more than  30  min with the patient: >50% of the time counseling regards fall prevention, risks of inactivity, need to socialize, see above

## 2018-09-21 ENCOUNTER — Observation Stay (HOSPITAL_COMMUNITY)
Admission: EM | Admit: 2018-09-21 | Discharge: 2018-09-23 | Disposition: A | Discharge status: Home / Self Care | Payer: Medicare HMO | Attending: Internal Medicine | Admitting: Internal Medicine

## 2018-09-21 ENCOUNTER — Encounter (HOSPITAL_COMMUNITY): Payer: Self-pay | Admitting: Emergency Medicine

## 2018-09-21 ENCOUNTER — Emergency Department (HOSPITAL_COMMUNITY): Payer: Medicare HMO

## 2018-09-21 DIAGNOSIS — F329 Major depressive disorder, single episode, unspecified: Secondary | ICD-10-CM | POA: Insufficient documentation

## 2018-09-21 DIAGNOSIS — S3991XA Unspecified injury of abdomen, initial encounter: Secondary | ICD-10-CM | POA: Diagnosis not present

## 2018-09-21 DIAGNOSIS — Z955 Presence of coronary angioplasty implant and graft: Secondary | ICD-10-CM | POA: Diagnosis not present

## 2018-09-21 DIAGNOSIS — R55 Syncope and collapse: Principal | ICD-10-CM | POA: Insufficient documentation

## 2018-09-21 DIAGNOSIS — S199XXA Unspecified injury of neck, initial encounter: Secondary | ICD-10-CM | POA: Diagnosis not present

## 2018-09-21 DIAGNOSIS — M6281 Muscle weakness (generalized): Secondary | ICD-10-CM | POA: Diagnosis not present

## 2018-09-21 DIAGNOSIS — R52 Pain, unspecified: Secondary | ICD-10-CM | POA: Diagnosis not present

## 2018-09-21 DIAGNOSIS — R0781 Pleurodynia: Secondary | ICD-10-CM | POA: Diagnosis not present

## 2018-09-21 DIAGNOSIS — Z87891 Personal history of nicotine dependence: Secondary | ICD-10-CM | POA: Insufficient documentation

## 2018-09-21 DIAGNOSIS — Z8249 Family history of ischemic heart disease and other diseases of the circulatory system: Secondary | ICD-10-CM | POA: Diagnosis not present

## 2018-09-21 DIAGNOSIS — H9190 Unspecified hearing loss, unspecified ear: Secondary | ICD-10-CM | POA: Diagnosis not present

## 2018-09-21 DIAGNOSIS — I2510 Atherosclerotic heart disease of native coronary artery without angina pectoris: Secondary | ICD-10-CM | POA: Insufficient documentation

## 2018-09-21 DIAGNOSIS — Z881 Allergy status to other antibiotic agents status: Secondary | ICD-10-CM | POA: Diagnosis not present

## 2018-09-21 DIAGNOSIS — W19XXXA Unspecified fall, initial encounter: Secondary | ICD-10-CM | POA: Diagnosis not present

## 2018-09-21 DIAGNOSIS — I700 Atherosclerosis of aorta: Secondary | ICD-10-CM | POA: Diagnosis not present

## 2018-09-21 DIAGNOSIS — J432 Centrilobular emphysema: Secondary | ICD-10-CM | POA: Diagnosis not present

## 2018-09-21 DIAGNOSIS — Z79899 Other long term (current) drug therapy: Secondary | ICD-10-CM | POA: Insufficient documentation

## 2018-09-21 DIAGNOSIS — M25552 Pain in left hip: Secondary | ICD-10-CM | POA: Diagnosis not present

## 2018-09-21 DIAGNOSIS — R2689 Other abnormalities of gait and mobility: Secondary | ICD-10-CM | POA: Insufficient documentation

## 2018-09-21 DIAGNOSIS — I252 Old myocardial infarction: Secondary | ICD-10-CM | POA: Insufficient documentation

## 2018-09-21 DIAGNOSIS — G4700 Insomnia, unspecified: Secondary | ICD-10-CM | POA: Insufficient documentation

## 2018-09-21 DIAGNOSIS — I498 Other specified cardiac arrhythmias: Secondary | ICD-10-CM | POA: Diagnosis not present

## 2018-09-21 DIAGNOSIS — E785 Hyperlipidemia, unspecified: Secondary | ICD-10-CM | POA: Diagnosis not present

## 2018-09-21 DIAGNOSIS — J438 Other emphysema: Secondary | ICD-10-CM | POA: Diagnosis not present

## 2018-09-21 DIAGNOSIS — Z8673 Personal history of transient ischemic attack (TIA), and cerebral infarction without residual deficits: Secondary | ICD-10-CM | POA: Diagnosis not present

## 2018-09-21 DIAGNOSIS — M542 Cervicalgia: Secondary | ICD-10-CM | POA: Diagnosis not present

## 2018-09-21 DIAGNOSIS — Z7982 Long term (current) use of aspirin: Secondary | ICD-10-CM | POA: Insufficient documentation

## 2018-09-21 DIAGNOSIS — Z885 Allergy status to narcotic agent status: Secondary | ICD-10-CM | POA: Diagnosis not present

## 2018-09-21 DIAGNOSIS — R911 Solitary pulmonary nodule: Secondary | ICD-10-CM | POA: Insufficient documentation

## 2018-09-21 DIAGNOSIS — M47892 Other spondylosis, cervical region: Secondary | ICD-10-CM | POA: Insufficient documentation

## 2018-09-21 DIAGNOSIS — R0902 Hypoxemia: Secondary | ICD-10-CM | POA: Diagnosis not present

## 2018-09-21 DIAGNOSIS — Z9181 History of falling: Secondary | ICD-10-CM | POA: Diagnosis not present

## 2018-09-21 DIAGNOSIS — S022XXA Fracture of nasal bones, initial encounter for closed fracture: Secondary | ICD-10-CM | POA: Diagnosis not present

## 2018-09-21 DIAGNOSIS — S299XXA Unspecified injury of thorax, initial encounter: Secondary | ICD-10-CM | POA: Diagnosis not present

## 2018-09-21 DIAGNOSIS — R51 Headache: Secondary | ICD-10-CM | POA: Diagnosis not present

## 2018-09-21 DIAGNOSIS — S3993XA Unspecified injury of pelvis, initial encounter: Secondary | ICD-10-CM | POA: Diagnosis not present

## 2018-09-21 LAB — I-STAT CHEM 8, ED
BUN: 8 mg/dL (ref 8–23)
Calcium, Ion: 1.17 mmol/L (ref 1.15–1.40)
Chloride: 104 mmol/L (ref 98–111)
Creatinine, Ser: 0.8 mg/dL (ref 0.61–1.24)
Glucose, Bld: 103 mg/dL — ABNORMAL HIGH (ref 70–99)
HCT: 47 % (ref 39.0–52.0)
HEMOGLOBIN: 16 g/dL (ref 13.0–17.0)
Potassium: 4 mmol/L (ref 3.5–5.1)
Sodium: 142 mmol/L (ref 135–145)
TCO2: 24 mmol/L (ref 22–32)

## 2018-09-21 LAB — CBG MONITORING, ED: Glucose-Capillary: 95 mg/dL (ref 70–99)

## 2018-09-21 LAB — I-STAT CG4 LACTIC ACID, ED: Lactic Acid, Venous: 1.99 mmol/L — ABNORMAL HIGH (ref 0.5–1.9)

## 2018-09-21 MED ORDER — LACTATED RINGERS IV BOLUS
500.0000 mL | Freq: Once | INTRAVENOUS | Status: AC
  Administered 2018-09-21: 500 mL via INTRAVENOUS

## 2018-09-21 MED ORDER — FENTANYL CITRATE (PF) 100 MCG/2ML IJ SOLN
50.0000 ug | Freq: Once | INTRAMUSCULAR | Status: AC
  Administered 2018-09-21: 50 ug via INTRAVENOUS
  Filled 2018-09-21: qty 2

## 2018-09-21 NOTE — ED Triage Notes (Signed)
Patient arrived with EMS from home lost his balance and fell on his left side this evening , presents with C- collar , reports left hip pain , left anterior ribcage pain  and posterior neck pain. Alert and oriented , he received Fentanyl 150 mcg IV by EMS , CBG= 103 .

## 2018-09-21 NOTE — ED Provider Notes (Signed)
[facility] EMERGENCY DEPARTMENT Provider Note   CSN: [id number] Arrival date & time: 09/21/18  2238  History   Chief Complaint Chief Complaint  Patient presents with  . Fall    Syncope    HPI Ricardo LAURA Sr. is a 72 y.o. male.  The history is provided by the patient. No language interpreter was used.     Patient is a 72 year old male with a past medical history of COPD, CAD status post CABG surgery, CVA, depression, hyperlipidemia, and colon polyp status post left colectomy who presents via EMS from home for evaluation of a ground-level fall.  Patient states his legs just gave out and he fell from a standing height hitting his left hip and possibly his head.  He states he passed out but does not know if he passed out immediately before his fall or when he may have hit his head.  He states that he woke up with head pain, neck pain, and left hip pain.  He denies any extremity pain, chest pain, abdominal pain, or other recent symptoms including fevers, chills, cough, shortness of breath, vomiting, diarrhea, dysuria, rash, or other acute complaints.  Denies prior similar episodes.  Denies any alleviating or aggravating factors aside from manipulation of his left hip.  Past Medical History:  Diagnosis Date  . Colon polyp    three 2-39mm polyps in descending colon, medium sized lipoma in sigmoid colon,  multiple 265mm in recto-sigmoid colon  . COPD, mild ([place])     PFTs 12/2008, PFTs 6-15 minimal dz  . Coronary atherosclerosis of native coronary artery    Multivessel, stent LAD 1998, thrombectomy 2006, PTCA 2011, NSTEMI 6/12 managed medically;  LexiScan Myoview (10/14):  Difficult study, inferior scar with peri-infarct ischemia, inferior HK, EF 45%; low risk  . 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 Depression   . Full dentures   . Headache(784.0)   . Hyperlipidemia   . NSTEMI  (non-ST elevated myocardial infarction) ([place]) 02-2011  . Wears glasses     Patient Active Problem List   Diagnosis Date Noted  . PCP NOTES >>>>>>>>>>>>>>>>>>>>>>>>>>>> 11/23/2015  . Pulmonary nodule-- CT  2015 stable, no further surveillance CTs 04/06/2012  . Noncompliance with medication regimen 03/23/2012  . Weight loss 01/06/2012  . Prediabetes 01/06/2012  . Annual physical exam 01/04/2011  . Chronic back pain 05/07/2010  . Hyperlipidemia 12/26/2009  . TOBACCO ABUSE 12/05/2009  . Anxiety and depression 12/05/2009  . Coronary atherosclerosis 12/05/2009  . COPD (chronic obstructive pulmonary disease) ([place]) 12/05/2009  . CEREBROVASCULAR ACCIDENT, HX OF 12/05/2009  . Personal history of colonic polyps 07/25/2008    Past Surgical History:  Procedure Laterality Date  . APPENDECTOMY    . BACK SURGERY  1986  . BALLOON ANGIOPLASTY, ARTERY  04/2010   sp PTCA  . [place]   placed in the LAD  . CORONARY STENT PLACEMENT  02/2005   PCI/drug-eluting stent implantation mid-LAD  . INGUINAL HERNIA REPAIR     x3  . LUMBAR FUSION     x5 Dr. Corrin Parker  . LUMBAR LAMINECTOMY  08/09/2013    L1  L2    Dr Luiz Ochoa  . MASS EXCISION Left 03/09/2013   Procedure: EXCISION left chest wall MASS;  Surgeon: Marcello Moores A. Cornett, MD;  Location: [facility];  Service: General;  Laterality: Left;  . NASAL FRACTURE SURGERY  x2  . 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 THROMBECTOMY  02/2005   acute non ST segment elevation myocardial infarction  . TONSILLECTOMY          Home Medications    Prior to Admission medications   Medication Sig Start Date End Date Taking? Authorizing Provider  aspirin EC 325 MG tablet Take 1 tablet (325 mg total) by mouth daily. 04/07/12   Richardson Dopp T, PA-C  nitroGLYCERIN (NITROSTAT) 0.4 MG SL tablet Place 1 tablet (0.4 mg total) under the tongue every 5 (five) minutes as needed for chest pain. Then contact physician and get emergent  medical care. Patient not taking: Reported on 11/22/2016 05/28/16   Colon Branch, MD  rosuvastatin (CRESTOR) 40 MG tablet Take 0.5 tablets (20 mg total) by mouth daily. 06/29/18   Colon Branch, MD  sertraline (ZOLOFT) 50 MG tablet Take 1 tablet (50 mg total) by mouth daily. Patient not taking: Reported on 08/03/2018 04/20/18   Colon Branch, MD  traZODone (DESYREL) 150 MG tablet Take 2 tablets (300 mg total) by mouth at bedtime as needed for sleep. 06/29/18   Colon Branch, MD    Family History Family History  Problem Relation Age of Onset  . Heart attack Mother   . Lung cancer Father        +smoker  . Diabetes Other        GM, aunt, other memebers  . Prostate cancer Neg Hx           . Colon cancer Neg Hx     Social History Social History   Tobacco Use  . Smoking status: Former Smoker    Packs/day: 0.25    Years: 25.00    Pack years: 6.25    Types: Cigarettes    Last attempt to quit: 07/2016    Years since quitting: 2.1  . Smokeless tobacco: Current User    Types: Chew  . Tobacco comment: quit 07-2016, 1/2 ppd  Substance Use Topics  . Alcohol use: Yes    Alcohol/week: 0.0 standard drinks    Comment: Rarely   . Drug use: No    Comment:       Allergies   Acetaminophen; Codeine; Hydrocodone-acetaminophen; Tetracycline; and Vicodin [hydrocodone-acetaminophen]   Review of Systems Review of Systems  Constitutional: Negative for chills and fever.  HENT: Negative for ear pain and sore throat.   Eyes: Negative for pain and visual disturbance.  Respiratory: Negative for cough and shortness of breath.   Cardiovascular: Negative for chest pain and palpitations.  Gastrointestinal: Negative for abdominal pain and vomiting.  Genitourinary: Negative for dysuria and hematuria.  Musculoskeletal: Positive for arthralgias ( left hip), myalgias ( left hip) and neck pain. Negative for back pain.  Skin: Negative for color change and rash.  Neurological: Positive for headaches. Negative for  seizures and syncope.  All other systems reviewed and are negative.    Physical Exam Updated Vital Signs BP 112/76   Pulse (!) 57   Temp 98.2 F (36.8 C) (Oral)   Resp 19   Ht 5\' 11"  (1.803 m)   Wt 85.7 kg   SpO2 98%   BMI 26.36 kg/m   Physical Exam Vitals signs and nursing note reviewed.  Constitutional:      Appearance: He is well-developed.  HENT:     Head: Normocephalic and atraumatic.     Right Ear: External ear normal.     Left Ear:  External ear normal.     Nose: Nose normal.     Mouth/Throat:     Mouth: Mucous membranes are moist.  Eyes:     Extraocular Movements: Extraocular movements intact.     Conjunctiva/sclera: Conjunctivae normal.     Pupils: Pupils are equal, round, and reactive to light.  Neck:     Musculoskeletal: Neck supple.  Cardiovascular:     Rate and Rhythm: Normal rate and regular rhythm.     Pulses:          Radial pulses are 2+ on the right side and 2+ on the left side.       Dorsalis pedis pulses are 2+ on the right side and 2+ on the left side.     Heart sounds: No murmur.  Pulmonary:     Effort: Pulmonary effort is normal. No respiratory distress.     Breath sounds: Normal breath sounds.  Abdominal:     Palpations: Abdomen is soft.     Tenderness: There is no abdominal tenderness.  Musculoskeletal:        General: Tenderness ( over left hip) present.     Cervical back: He exhibits no bony tenderness.     Thoracic back: He exhibits no bony tenderness.     Lumbar back: He exhibits no bony tenderness.  Skin:    General: Skin is warm and dry.     Capillary Refill: Capillary refill takes less than 2 seconds.  Neurological:     General: No focal deficit present.     Mental Status: He is alert.      ED Treatments / Results  Labs (all labs ordered are listed, but only abnormal results are displayed) Labs Reviewed  I-STAT CHEM 8, ED - Abnormal; Notable for the following components:      Result Value   Glucose, Bld 103 (*)    All  other components within normal limits  I-STAT CG4 LACTIC ACID, ED - Abnormal; Notable for the following components:   Lactic Acid, Venous 1.99 (*)    All other components within normal limits  CDS SEROLOGY  COMPREHENSIVE METABOLIC PANEL  CBC  ETHANOL  URINALYSIS, ROUTINE W REFLEX MICROSCOPIC  PROTIME-INR  TROPONIN I  BRAIN NATRIURETIC PEPTIDE  MAGNESIUM  CBG MONITORING, ED  SAMPLE TO BLOOD BANK    EKG EKG Interpretation  Date/Time:  Monday September 21 2018 22:40:26 EST Ventricular Rate:  55 PR Interval:    QRS Duration: 178 QT Interval:  422 QTC Calculation: 404 R Axis:   -28 Text Interpretation:  Junctional rhythm Nonspecific intraventricular conduction delay Inferior infarct, old Confirmed by Pattricia Boss [id number]) on 09/21/2018 11:58:07 PM   Radiology Ct Head Wo Contrast  Result Date: 09/21/2018 CLINICAL DATA:  Fall this evening onto left side. Posterior neck pain. Headache. EXAM: CT HEAD WITHOUT CONTRAST CT CERVICAL SPINE WITHOUT CONTRAST TECHNIQUE: Multidetector CT imaging of the head and cervical spine was performed following the standard protocol without intravenous contrast. Multiplanar CT image reconstructions of the cervical spine were also generated. COMPARISON:  03/14/2011 head and cervical spine CT. FINDINGS: CT HEAD FINDINGS Brain: No evidence of parenchymal hemorrhage or extra-axial fluid collection. No mass lesion, mass effect, or midline shift. No CT evidence of acute infarction. Nonspecific mild subcortical and periventricular white matter hypodensity, most in keeping with chronic small vessel ischemic change. Generalized cerebral volume loss. No ventriculomegaly. Vascular: No acute abnormality. Skull: No evidence of calvarial fracture. Bilateral nasal bone fractures of indeterminate chronicity, favor chronic. Sinuses/Orbits: The  visualized paranasal sinuses are essentially clear. Other:  The mastoid air cells are unopacified. CT CERVICAL SPINE FINDINGS Alignment:  Straightening of the cervical spine. No facet subluxation. Dens is well positioned between the lateral masses of C1. Skull base and vertebrae: No acute fracture. No primary bone lesion or focal pathologic process. Soft tissues and spinal canal: No prevertebral edema. No visible canal hematoma. Disc levels: Marked multilevel degenerative disc disease throughout the cervical spine, most prominent C5-6 and C6-7. Advanced bilateral facet arthropathy. Mild degenerative foraminal stenosis bilaterally at C3-4 and C5-6. Upper chest: Centrilobular and paraseptal emphysema. Other: Visualized mastoid air cells appear clear. No discrete thyroid nodules. No pathologically enlarged cervical nodes. IMPRESSION: 1. No evidence of acute intracranial abnormality. No evidence of calvarial fracture. 2. Generalized cerebral volume loss and mild chronic small vessel ischemic changes in the cerebral white matter. 3. Bilateral nasal bone fractures of indeterminate chronicity, favor chronic, correlate with directed clinical exam. 4. No cervical spine fracture or subluxation. 5. Marked multilevel degenerative changes in the cervical spine as detailed. 6.  Emphysema (ICD10-J43.9). Electronically Signed   By: Ilona Sorrel M.D.   On: 09/21/2018 23:43   Ct Cervical Spine Wo Contrast  Result Date: 09/21/2018 CLINICAL DATA:  Fall this evening onto left side. Posterior neck pain. Headache. EXAM: CT HEAD WITHOUT CONTRAST CT CERVICAL SPINE WITHOUT CONTRAST TECHNIQUE: Multidetector CT imaging of the head and cervical spine was performed following the standard protocol without intravenous contrast. Multiplanar CT image reconstructions of the cervical spine were also generated. COMPARISON:  03/14/2011 head and cervical spine CT. FINDINGS: CT HEAD FINDINGS Brain: No evidence of parenchymal hemorrhage or extra-axial fluid collection. No mass lesion, mass effect, or midline shift. No CT evidence of acute infarction. Nonspecific mild subcortical and  periventricular white matter hypodensity, most in keeping with chronic small vessel ischemic change. Generalized cerebral volume loss. No ventriculomegaly. Vascular: No acute abnormality. Skull: No evidence of calvarial fracture. Bilateral nasal bone fractures of indeterminate chronicity, favor chronic. Sinuses/Orbits: The visualized paranasal sinuses are essentially clear. Other:  The mastoid air cells are unopacified. CT CERVICAL SPINE FINDINGS Alignment: Straightening of the cervical spine. No facet subluxation. Dens is well positioned between the lateral masses of C1. Skull base and vertebrae: No acute fracture. No primary bone lesion or focal pathologic process. Soft tissues and spinal canal: No prevertebral edema. No visible canal hematoma. Disc levels: Marked multilevel degenerative disc disease throughout the cervical spine, most prominent C5-6 and C6-7. Advanced bilateral facet arthropathy. Mild degenerative foraminal stenosis bilaterally at C3-4 and C5-6. Upper chest: Centrilobular and paraseptal emphysema. Other: Visualized mastoid air cells appear clear. No discrete thyroid nodules. No pathologically enlarged cervical nodes. IMPRESSION: 1. No evidence of acute intracranial abnormality. No evidence of calvarial fracture. 2. Generalized cerebral volume loss and mild chronic small vessel ischemic changes in the cerebral white matter. 3. Bilateral nasal bone fractures of indeterminate chronicity, favor chronic, correlate with directed clinical exam. 4. No cervical spine fracture or subluxation. 5. Marked multilevel degenerative changes in the cervical spine as detailed. 6.  Emphysema (ICD10-J43.9). Electronically Signed   By: Ilona Sorrel M.D.   On: 09/21/2018 23:43   Dg Pelvis Portable  Result Date: 09/21/2018 CLINICAL DATA:  Syncope, fall EXAM: PORTABLE PELVIS 1-2 VIEWS COMPARISON:  None. FINDINGS: Partially visualized hardware in the spine. Surgical changes in the right hemiabdomen. SI joints are non  widened. Pubic symphysis and rami are intact. No fracture or malalignment. IMPRESSION: No acute osseous abnormality Electronically Signed   By: Maudie Mercury  Francoise Ceo M.D.   On: 09/21/2018 23:29   Dg Chest Port 1 View  Result Date: 09/21/2018 CLINICAL DATA:  Fall, syncope EXAM: PORTABLE CHEST 1 VIEW COMPARISON:  06/30/2013, CT chest 11/01/2013 FINDINGS: Hyperinflation with emphysematous disease and mild fibrosis. No acute consolidation or effusion. Normal cardiomediastinal silhouette with aortic atherosclerosis. No pneumothorax. IMPRESSION: No active disease.  Hyperinflation with emphysematous disease Electronically Signed   By: Donavan Foil M.D.   On: 09/21/2018 23:28    Procedures Procedures (including critical care time)  Medications Ordered in ED Medications  lactated ringers bolus 500 mL (500 mLs Intravenous New Bag/Given 09/21/18 2349)  fentaNYL (SUBLIMAZE) injection 50 mcg (50 mcg Intravenous Given 09/21/18 2348)     Initial Impression / Assessment and Plan / ED Course  I have reviewed the triage vital signs and the nursing notes.  Pertinent labs & imaging results that were available during my care of the patient were reviewed by me and considered in my medical decision making (see chart for details).     Patient is a 72 year old male who presents with above-stated history exam for evaluation of possible syncopal episode surrounding ground-level fall on which the patient states he hit his left hip and possibly his head.  On presentation patient is afebrile stable vital signs.  Exam as above remarkable for tenderness palpation of the left hip and left gluteus.  Patient given IV fluids and IV analgesia in the emergency department.  CT head and CT C-spine were obtained that showed "No evidence of acute intracranial abnormality. No evidence of calvarial fracture. Generalized cerebral volume loss and mild chronic small vessel ischemic changes in the cerebral white matter. Bilateral nasal bone  fractures of indeterminate chronicity, favor chronic, correlate with directed clinical exam. No cervical spine fracture or subluxation.  Marked multilevel degenerative changes in the cervical spine. Emphysema" chest x-ray showed no acute traumatic injuries with findings consistent with emphysema.  Pelvis x-ray showed no acute traumatic injuries.  On reassessment patient c-collar was cleared and a trial of ambulation was attempted.  However the patient was unable to bear weight on his left lower extremity and thus a CT of his abdomen pelvis was obtained.  Care of patient assumed by Montine Circle at approx. 0005. Plan is to f/u CT A/P and admit regardless of syncope +/_ ortho consult for occult hip fracture.   Final Clinical Impressions(s) / ED Diagnoses   Final diagnoses:  Fall, initial encounter  Left hip pain    ED Discharge Orders    None       Hulan Saas, MD 09/22/18 Berniece Salines    Pattricia Boss, MD 09/23/18 1500

## 2018-09-21 NOTE — ED Notes (Signed)
CBG 95 

## 2018-09-22 ENCOUNTER — Other Ambulatory Visit: Payer: Self-pay

## 2018-09-22 ENCOUNTER — Observation Stay (HOSPITAL_BASED_OUTPATIENT_CLINIC_OR_DEPARTMENT_OTHER): Payer: Medicare HMO

## 2018-09-22 ENCOUNTER — Emergency Department (HOSPITAL_COMMUNITY): Payer: Medicare HMO

## 2018-09-22 ENCOUNTER — Ambulatory Visit (HOSPITAL_BASED_OUTPATIENT_CLINIC_OR_DEPARTMENT_OTHER): Payer: Medicare HMO

## 2018-09-22 ENCOUNTER — Encounter (HOSPITAL_COMMUNITY): Payer: Self-pay

## 2018-09-22 DIAGNOSIS — R55 Syncope and collapse: Secondary | ICD-10-CM

## 2018-09-22 DIAGNOSIS — E785 Hyperlipidemia, unspecified: Secondary | ICD-10-CM

## 2018-09-22 DIAGNOSIS — I2510 Atherosclerotic heart disease of native coronary artery without angina pectoris: Secondary | ICD-10-CM

## 2018-09-22 DIAGNOSIS — R911 Solitary pulmonary nodule: Secondary | ICD-10-CM

## 2018-09-22 DIAGNOSIS — S3991XA Unspecified injury of abdomen, initial encounter: Secondary | ICD-10-CM | POA: Diagnosis not present

## 2018-09-22 DIAGNOSIS — W19XXXS Unspecified fall, sequela: Secondary | ICD-10-CM

## 2018-09-22 DIAGNOSIS — W19XXXA Unspecified fall, initial encounter: Secondary | ICD-10-CM

## 2018-09-22 LAB — URINALYSIS, ROUTINE W REFLEX MICROSCOPIC
Bilirubin Urine: NEGATIVE
Glucose, UA: NEGATIVE mg/dL
Hgb urine dipstick: NEGATIVE
Ketones, ur: 5 mg/dL — AB
Leukocytes, UA: NEGATIVE
NITRITE: NEGATIVE
PH: 8 (ref 5.0–8.0)
Protein, ur: NEGATIVE mg/dL
Specific Gravity, Urine: 1.035 — ABNORMAL HIGH (ref 1.005–1.030)

## 2018-09-22 LAB — SAMPLE TO BLOOD BANK

## 2018-09-22 LAB — CBC
HCT: 46.8 % (ref 39.0–52.0)
HEMOGLOBIN: 15.5 g/dL (ref 13.0–17.0)
MCH: 30.8 pg (ref 26.0–34.0)
MCHC: 33.1 g/dL (ref 30.0–36.0)
MCV: 93 fL (ref 80.0–100.0)
Platelets: 213 10*3/uL (ref 150–400)
RBC: 5.03 MIL/uL (ref 4.22–5.81)
RDW: 12.8 % (ref 11.5–15.5)
WBC: 8.5 10*3/uL (ref 4.0–10.5)
nRBC: 0 % (ref 0.0–0.2)

## 2018-09-22 LAB — COMPREHENSIVE METABOLIC PANEL
ALT: 14 U/L (ref 0–44)
AST: 21 U/L (ref 15–41)
Albumin: 3.7 g/dL (ref 3.5–5.0)
Alkaline Phosphatase: 66 U/L (ref 38–126)
Anion gap: 11 (ref 5–15)
BUN: <5 mg/dL — ABNORMAL LOW (ref 8–23)
CO2: 24 mmol/L (ref 22–32)
Calcium: 9.2 mg/dL (ref 8.9–10.3)
Chloride: 106 mmol/L (ref 98–111)
Creatinine, Ser: 0.88 mg/dL (ref 0.61–1.24)
GFR calc Af Amer: >60 mL/min (ref >60)
GFR calc non Af Amer: >60 mL/min (ref >60)
Glucose, Bld: 105 mg/dL — ABNORMAL HIGH (ref 70–99)
Potassium: 3.9 mmol/L (ref 3.5–5.1)
SODIUM: 141 mmol/L (ref 135–145)
Total Bilirubin: 0.8 mg/dL (ref 0.3–1.2)
Total Protein: 6.5 g/dL (ref 6.5–8.1)

## 2018-09-22 LAB — TROPONIN I
Troponin I: <0.03 ng/mL (ref <0.03)
Troponin I: <0.03 ng/mL (ref <0.03)
Troponin I: <0.03 ng/mL (ref <0.03)

## 2018-09-22 LAB — ECHOCARDIOGRAM COMPLETE
Height: 71 in
Weight: 3024 oz

## 2018-09-22 LAB — PROTIME-INR
INR: 0.98
Prothrombin Time: 12.9 seconds (ref 11.4–15.2)

## 2018-09-22 LAB — ETHANOL: Alcohol, Ethyl (B): <10 mg/dL (ref <10)

## 2018-09-22 LAB — CDS SEROLOGY

## 2018-09-22 LAB — TSH: TSH: 1.29 u[IU]/mL (ref 0.350–4.500)

## 2018-09-22 LAB — BRAIN NATRIURETIC PEPTIDE: B Natriuretic Peptide: 52.6 pg/mL (ref 0.0–100.0)

## 2018-09-22 LAB — MAGNESIUM: Magnesium: 1.8 mg/dL (ref 1.7–2.4)

## 2018-09-22 MED ORDER — ENOXAPARIN SODIUM 40 MG/0.4ML ~~LOC~~ SOLN
40.0000 mg | SUBCUTANEOUS | Status: DC
  Administered 2018-09-22: 40 mg via SUBCUTANEOUS
  Filled 2018-09-22: qty 0.4

## 2018-09-22 MED ORDER — TRAZODONE HCL 150 MG PO TABS
300.0000 mg | ORAL_TABLET | Freq: Every evening | ORAL | Status: DC | PRN
  Administered 2018-09-22: 300 mg via ORAL
  Filled 2018-09-22 (×2): qty 2

## 2018-09-22 MED ORDER — ROSUVASTATIN CALCIUM 20 MG PO TABS
20.0000 mg | ORAL_TABLET | Freq: Every day | ORAL | Status: DC
  Administered 2018-09-22 – 2018-09-23 (×2): 20 mg via ORAL
  Filled 2018-09-22 (×2): qty 1

## 2018-09-22 MED ORDER — IBUPROFEN 600 MG PO TABS
600.0000 mg | ORAL_TABLET | Freq: Four times a day (QID) | ORAL | Status: DC | PRN
  Administered 2018-09-22 – 2018-09-23 (×2): 600 mg via ORAL
  Filled 2018-09-22 (×2): qty 1

## 2018-09-22 MED ORDER — IOHEXOL 300 MG/ML  SOLN
100.0000 mL | Freq: Once | INTRAMUSCULAR | Status: AC | PRN
  Administered 2018-09-22: 100 mL via INTRAVENOUS

## 2018-09-22 MED ORDER — SODIUM CHLORIDE 0.9% FLUSH
3.0000 mL | Freq: Two times a day (BID) | INTRAVENOUS | Status: DC
  Administered 2018-09-22 – 2018-09-23 (×3): 3 mL via INTRAVENOUS

## 2018-09-22 MED ORDER — SERTRALINE HCL 50 MG PO TABS
50.0000 mg | ORAL_TABLET | Freq: Every day | ORAL | Status: DC
  Administered 2018-09-22 – 2018-09-23 (×2): 50 mg via ORAL
  Filled 2018-09-22 (×3): qty 1

## 2018-09-22 MED ORDER — ASPIRIN EC 325 MG PO TBEC
325.0000 mg | DELAYED_RELEASE_TABLET | Freq: Every day | ORAL | Status: DC
  Administered 2018-09-22 – 2018-09-23 (×2): 325 mg via ORAL
  Filled 2018-09-22 (×2): qty 1

## 2018-09-22 NOTE — Evaluation (Signed)
Physical Therapy Evaluation Patient Details Name: Ricardo HAEN Sr. MRN: [id number] DOB: 02-21-1946 Today's Date: 09/22/2018   History of Present Illness  72 yo admitted after syncope at home. PMHx:COPD, CAD, CVA, NSTEMI  Clinical Impression  Pt pleasant and reports he is eager to eat. Pt reports 2 falls in the last year in addition to syncopal event. Pt states he cares for himself other than homemaking and tends to furniture walk in the house. Pt with decreased strength, ability with transfers and gait who will benefit from acute therapy to maximize mobility, function and strength to decrease burden of care and fall risk. Pt educated for need to use RW at all times for gait and to perform standing transitions slowly due to orthostatic drop.   Orthostatic BPs  Supine 141/97  Sitting 131/71     Standing 103/80  Standing after 3 min 112/81       Follow Up Recommendations Home health PT    Equipment Recommendations  None recommended by PT    Recommendations for Other Services       Precautions / Restrictions Precautions Precautions: Fall      Mobility  Bed Mobility Overal bed mobility: Modified Independent                Transfers Overall transfer level: Modified independent                  Ambulation/Gait Ambulation/Gait assistance: Min guard Gait Distance (Feet): 100 Feet Assistive device: Rolling walker (2 wheeled) Gait Pattern/deviations: Step-through pattern;Decreased stride length;Trunk flexed   Gait velocity interpretation: 1.31 - 2.62 ft/sec, indicative of limited community ambulator General Gait Details: cues for posture and position in RW, pt limited by fatigue  Stairs            Wheelchair Mobility    Modified Rankin (Stroke Patients Only)       Balance Overall balance assessment: Needs assistance Sitting-balance support: Feet supported;No upper extremity supported Sitting balance-Leahy Scale: Good     Standing balance  support: Single extremity supported Standing balance-Leahy Scale: Fair Standing balance comment: pt cannot stand more than 30 sec without UE support                             Pertinent Vitals/Pain Pain Assessment: 0-10 Pain Score: 4  Pain Location: neck in standing Pain Descriptors / Indicators: Aching Pain Intervention(s): Limited activity within patient's tolerance;Monitored during session;Repositioned    Home Living Family/patient expects to be discharged to:: Private residence Living Arrangements: Other relatives Available Help at Discharge: Family;Available PRN/intermittently Type of Home: Mobile home Home Access: Stairs to enter Entrance Stairs-Rails: Right;Left Entrance Stairs-Number of Steps: 4 Home Layout: One level Home Equipment: Walker - 4 wheels;Cane - single point      Prior Function Level of Independence: Independent with assistive device(s)         Comments: doesnt use AD in home, cane and RW at times in community     Hand Dominance        Extremity/Trunk Assessment   Upper Extremity Assessment Upper Extremity Assessment: Generalized weakness    Lower Extremity Assessment Lower Extremity Assessment: Generalized weakness    Cervical / Trunk Assessment Cervical / Trunk Assessment: Kyphotic;Other exceptions Cervical / Trunk Exceptions: forward head  Communication   Communication: No difficulties  Cognition Arousal/Alertness: Awake/alert Behavior During Therapy: WFL for tasks assessed/performed Overall Cognitive Status: Impaired/Different from baseline Area of Impairment: Safety/judgement  Safety/Judgement: Decreased awareness of safety            General Comments      Exercises     Assessment/Plan    PT Assessment Patient needs continued PT services  PT Problem List Decreased strength;Decreased balance;Decreased mobility;Decreased knowledge of use of DME;Decreased activity  tolerance;Decreased safety awareness       PT Treatment Interventions DME instruction;Functional mobility training;Balance training;Patient/family education;Gait training;Therapeutic activities;Therapeutic exercise;Stair training    PT Goals (Current goals can be found in the Care Plan section)  Acute Rehab PT Goals Patient Stated Goal: go fishing and watch football PT Goal Formulation: With patient Time For Goal Achievement: 10/06/18 Potential to Achieve Goals: Good    Frequency Min 3X/week   Barriers to discharge Decreased caregiver support lives with grandson who works     Co-evaluation               AM-PAC PT "6 Clicks" Mobility  Outcome Measure Help needed turning from your back to your side while in a flat bed without using bedrails?: None Help needed moving from lying on your back to sitting on the side of a flat bed without using bedrails?: None Help needed moving to and from a bed to a chair (including a wheelchair)?: None Help needed standing up from a chair using your arms (e.g., wheelchair or bedside chair)?: A Little Help needed to walk in hospital room?: A Little Help needed climbing 3-5 steps with a railing? : A Little 6 Click Score: 21    End of Session Equipment Utilized During Treatment: Gait belt Activity Tolerance: Patient tolerated treatment well Patient left: in chair;with call bell/phone within reach;with chair alarm set Nurse Communication: Mobility status PT Visit Diagnosis: Other abnormalities of gait and mobility (R26.89);Muscle weakness (generalized) (M62.81);History of falling (Z91.81)    Time: 2863-8177 PT Time Calculation (min) (ACUTE ONLY): 26 min   Charges:   PT Evaluation $PT Eval Moderate Complexity: 1 Mod PT Treatments $Gait Training: 8-22 mins        Rachid Parham Pam Drown, PT Acute Rehabilitation Services Pager: [phone] Office: [place] 09/22/2018, 1:26 PM

## 2018-09-22 NOTE — H&P (Signed)
History and Physical    [name] Sr. TDV:[id number] DOB: 18-Aug-1946 DOA: 09/21/2018  PCP: Colon Branch, MD Patient coming from: Home  Chief Complaint: Fall, syncope  HPI: Ricardo Palmer Sr. is a 72 y.o. male with medical history significant of COPD, CAD, CVA, hyperlipidemia presenting to the hospital via EMS for evaluation of fall and syncope.  Patient states he was in his usual state of health until yesterday morning when he felt a little dizzy.  In the afternoon he was getting up to go to the bathroom and all of a sudden blacked out and fell.  Denies having any chest pain, dizziness, or shortness of breath prior to the fall.  States when he woke up he was having a lot of pain in his left hip.  His grandson found him on the floor.  He is not sure for how long he was unconscious.  No further history could be obtained from the patient.  ED Course: Not hypotensive.  Pulse in the 50s to 60s.  Afebrile.  No leukocytosis.  Lactic acid 1.9.  Blood ethanol level negative.  Troponin negative and EKG not suggestive of ACS.  BNP normal.  UA pending.  Chest x-ray without evidence of active disease.  X-ray of pelvis without acute abnormality.  CT head without acute intracranial abnormality.  Showing bilateral nasal bone fractures of indeterminate chronicity, favor chronic.  CT C-spine negative for acute abnormality.  CT abdomen pelvis showing no evidence of traumatic injury to the abdomen or pelvis.  Review of Systems: As per HPI otherwise 10 point review of systems negative.  Past Medical History:  Diagnosis Date  . Colon polyp    three 2-24mm polyps in descending colon, medium sized lipoma in sigmoid colon,  multiple 223mm in recto-sigmoid colon  . COPD, mild ([place])     PFTs 12/2008, PFTs 6-15 minimal dz  . Coronary atherosclerosis of native coronary artery    Multivessel, stent LAD 1998, thrombectomy 2006, PTCA 2011, NSTEMI 6/12 managed medically;  LexiScan Myoview (10/14):  Difficult study,  inferior scar with peri-infarct ischemia, inferior HK, EF 45%; low risk  . 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 Depression   . Full dentures   . Headache(784.0)   . Hyperlipidemia   . NSTEMI (non-ST elevated myocardial infarction) ([name]) 02-2011  . Wears glasses     Past Surgical History:  Procedure Laterality Date  . APPENDECTOMY    . BACK SURGERY  1986  . BALLOON ANGIOPLASTY, ARTERY  04/2010   sp PTCA  . [place]   placed in the LAD  . CORONARY STENT PLACEMENT  02/2005   PCI/drug-eluting stent implantation mid-LAD  . INGUINAL HERNIA REPAIR     x3  . LUMBAR FUSION     x5 Dr. Corrin Parker  . LUMBAR LAMINECTOMY  08/09/2013    L1  L2    Dr Luiz Ochoa  . MASS EXCISION Left 03/09/2013   Procedure: EXCISION left chest wall MASS;  Surgeon: Marcello Moores A. Cornett, MD;  Location: [facility];  Service: General;  Laterality: Left;  . NASAL FRACTURE SURGERY     x2  . 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 THROMBECTOMY  02/2005   acute non ST segment elevation myocardial infarction  . TONSILLECTOMY       reports that he quit smoking about 2 years  ago. His smoking use included cigarettes. He has a 6.25 pack-year smoking history. His smokeless tobacco use includes chew. He reports current alcohol use. He reports that he does not use drugs.  Allergies  Allergen Reactions  . Acetaminophen     REACTION: tongue swells  . Codeine     REACTION: Makes "him mean"  . Hydrocodone-Acetaminophen     REACTION: nausea, vomiting "makes me deathly sick"  . Tetracycline     REACTION: loss of balance  . Vicodin [Hydrocodone-Acetaminophen]     Family History  Problem Relation Age of Onset  . Heart attack Mother   . Lung cancer Father        +smoker  . Diabetes Other        GM, aunt, other memebers  . Prostate cancer Neg Hx           . Colon cancer Neg Hx     Prior to  Admission medications   Medication Sig Start Date End Date Taking? Authorizing Provider  aspirin EC 325 MG tablet Take 1 tablet (325 mg total) by mouth daily. 04/07/12  Yes Weaver, Scott T, PA-C  nitroGLYCERIN (NITROSTAT) 0.4 MG SL tablet Place 1 tablet (0.4 mg total) under the tongue every 5 (five) minutes as needed for chest pain. Then contact physician and get emergent medical care. 05/28/16  Yes Paz, Alda Berthold, MD  rosuvastatin (CRESTOR) 40 MG tablet Take 0.5 tablets (20 mg total) by mouth daily. 06/29/18  Yes Paz, Alda Berthold, MD  sertraline (ZOLOFT) 50 MG tablet Take 1 tablet (50 mg total) by mouth daily. 04/20/18  Yes Colon Branch, MD  traZODone (DESYREL) 150 MG tablet Take 2 tablets (300 mg total) by mouth at bedtime as needed for sleep. 06/29/18  Yes Colon Branch, MD    Physical Exam: Vitals:   09/22/18 0045 09/22/18 0132 09/22/18 0701 09/22/18 0852  BP: (!) 134/92 (!) 139/93 122/79 129/87  Pulse: (!) 54 68 (!) 53 65  Resp: 10 20 18 18   Temp:      TempSrc:      SpO2: 99% 93% 96% 97%  Weight:      Height:        Physical Exam  Constitutional: He is oriented to person, place, and time. He appears well-developed and well-nourished. No distress.  HENT:  Head: Normocephalic.  Mouth/Throat: Oropharynx is clear and moist.  Eyes: Pupils are equal, round, and reactive to light.  Neck: Neck supple.  Cardiovascular: Normal rate, regular rhythm and intact distal pulses.  Pulmonary/Chest: Effort normal and breath sounds normal. No respiratory distress. He has no wheezes. He has no rales.  Abdominal: Soft. Bowel sounds are normal. He exhibits no distension. There is no abdominal tenderness. There is no guarding.  Musculoskeletal:        General: No edema.  Neurological: He is alert and oriented to person, place, and time. No cranial nerve deficit.  Speech fluent, tongue midline, no facial droop Strength 5 out of 5 in bilateral upper and lower extremities.   Sensation to light touch intact throughout.   Skin: Skin is warm and dry. He is not diaphoretic.     Labs on Admission: I have personally reviewed following labs and imaging studies  CBC: Recent Labs  Lab 09/21/18 2345 09/21/18 2355  WBC 8.5  --   HGB 15.5 16.0  HCT 46.8 47.0  MCV 93.0  --   PLT 213  --    Basic Metabolic Panel: Recent Labs  Lab  09/21/18 2345 09/21/18 2355  NA 141 142  K 3.9 4.0  CL 106 104  CO2 24  --   GLUCOSE 105* 103*  BUN <5* 8  CREATININE 0.88 0.80  CALCIUM 9.2  --   MG 1.8  --    GFR: Estimated Creatinine Clearance: 88.9 mL/min (by C-G formula based on SCr of 0.8 mg/dL). Liver Function Tests: Recent Labs  Lab 09/21/18 2345  AST 21  ALT 14  ALKPHOS 66  BILITOT 0.8  PROT 6.5  ALBUMIN 3.7   No results for input(s): LIPASE, AMYLASE in the last 168 hours. No results for input(s): AMMONIA in the last 168 hours. Coagulation Profile: Recent Labs  Lab 09/21/18 2345  INR 0.98   Cardiac Enzymes: Recent Labs  Lab 09/21/18 2345  TROPONINI <0.03   BNP (last 3 results) No results for input(s): PROBNP in the last 8760 hours. HbA1C: No results for input(s): HGBA1C in the last 72 hours. CBG: Recent Labs  Lab 09/21/18 2240  GLUCAP 95   Lipid Profile: No results for input(s): CHOL, HDL, LDLCALC, TRIG, CHOLHDL, LDLDIRECT in the last 72 hours. Thyroid Function Tests: No results for input(s): TSH, T4TOTAL, FREET4, T3FREE, THYROIDAB in the last 72 hours. Anemia Panel: No results for input(s): VITAMINB12, FOLATE, FERRITIN, TIBC, IRON, RETICCTPCT in the last 72 hours. Urine analysis:    Component Value Date/Time   COLORURINE YELLOW 07/30/2013 1158   APPEARANCEUR CLEAR 07/30/2013 1158   LABSPEC 1.025 07/30/2013 1158   PHURINE 5.0 07/30/2013 1158   GLUCOSEU NEGATIVE 07/30/2013 1158   HGBUR NEGATIVE 07/30/2013 1158   BILIRUBINUR SMALL (A) 07/30/2013 1158   KETONESUR NEGATIVE 07/30/2013 1158   PROTEINUR NEGATIVE 07/30/2013 1158   UROBILINOGEN 0.2 07/30/2013 1158   NITRITE  NEGATIVE 07/30/2013 1158   LEUKOCYTESUR SMALL (A) 07/30/2013 1158    Radiological Exams on Admission: Ct Head Wo Contrast  Result Date: 09/21/2018 CLINICAL DATA:  Fall this evening onto left side. Posterior neck pain. Headache. EXAM: CT HEAD WITHOUT CONTRAST CT CERVICAL SPINE WITHOUT CONTRAST TECHNIQUE: Multidetector CT imaging of the head and cervical spine was performed following the standard protocol without intravenous contrast. Multiplanar CT image reconstructions of the cervical spine were also generated. COMPARISON:  03/14/2011 head and cervical spine CT. FINDINGS: CT HEAD FINDINGS Brain: No evidence of parenchymal hemorrhage or extra-axial fluid collection. No mass lesion, mass effect, or midline shift. No CT evidence of acute infarction. Nonspecific mild subcortical and periventricular white matter hypodensity, most in keeping with chronic small vessel ischemic change. Generalized cerebral volume loss. No ventriculomegaly. Vascular: No acute abnormality. Skull: No evidence of calvarial fracture. Bilateral nasal bone fractures of indeterminate chronicity, favor chronic. Sinuses/Orbits: The visualized paranasal sinuses are essentially clear. Other:  The mastoid air cells are unopacified. CT CERVICAL SPINE FINDINGS Alignment: Straightening of the cervical spine. No facet subluxation. Dens is well positioned between the lateral masses of C1. Skull base and vertebrae: No acute fracture. No primary bone lesion or focal pathologic process. Soft tissues and spinal canal: No prevertebral edema. No visible canal hematoma. Disc levels: Marked multilevel degenerative disc disease throughout the cervical spine, most prominent C5-6 and C6-7. Advanced bilateral facet arthropathy. Mild degenerative foraminal stenosis bilaterally at C3-4 and C5-6. Upper chest: Centrilobular and paraseptal emphysema. Other: Visualized mastoid air cells appear clear. No discrete thyroid nodules. No pathologically enlarged cervical  nodes. IMPRESSION: 1. No evidence of acute intracranial abnormality. No evidence of calvarial fracture. 2. Generalized cerebral volume loss and mild chronic small vessel ischemic changes in  the cerebral white matter. 3. Bilateral nasal bone fractures of indeterminate chronicity, favor chronic, correlate with directed clinical exam. 4. No cervical spine fracture or subluxation. 5. Marked multilevel degenerative changes in the cervical spine as detailed. 6.  Emphysema (ICD10-J43.9). Electronically Signed   By: Ilona Sorrel M.D.   On: 09/21/2018 23:43   Ct Cervical Spine Wo Contrast  Result Date: 09/21/2018 CLINICAL DATA:  Fall this evening onto left side. Posterior neck pain. Headache. EXAM: CT HEAD WITHOUT CONTRAST CT CERVICAL SPINE WITHOUT CONTRAST TECHNIQUE: Multidetector CT imaging of the head and cervical spine was performed following the standard protocol without intravenous contrast. Multiplanar CT image reconstructions of the cervical spine were also generated. COMPARISON:  03/14/2011 head and cervical spine CT. FINDINGS: CT HEAD FINDINGS Brain: No evidence of parenchymal hemorrhage or extra-axial fluid collection. No mass lesion, mass effect, or midline shift. No CT evidence of acute infarction. Nonspecific mild subcortical and periventricular white matter hypodensity, most in keeping with chronic small vessel ischemic change. Generalized cerebral volume loss. No ventriculomegaly. Vascular: No acute abnormality. Skull: No evidence of calvarial fracture. Bilateral nasal bone fractures of indeterminate chronicity, favor chronic. Sinuses/Orbits: The visualized paranasal sinuses are essentially clear. Other:  The mastoid air cells are unopacified. CT CERVICAL SPINE FINDINGS Alignment: Straightening of the cervical spine. No facet subluxation. Dens is well positioned between the lateral masses of C1. Skull base and vertebrae: No acute fracture. No primary bone lesion or focal pathologic process. Soft tissues  and spinal canal: No prevertebral edema. No visible canal hematoma. Disc levels: Marked multilevel degenerative disc disease throughout the cervical spine, most prominent C5-6 and C6-7. Advanced bilateral facet arthropathy. Mild degenerative foraminal stenosis bilaterally at C3-4 and C5-6. Upper chest: Centrilobular and paraseptal emphysema. Other: Visualized mastoid air cells appear clear. No discrete thyroid nodules. No pathologically enlarged cervical nodes. IMPRESSION: 1. No evidence of acute intracranial abnormality. No evidence of calvarial fracture. 2. Generalized cerebral volume loss and mild chronic small vessel ischemic changes in the cerebral white matter. 3. Bilateral nasal bone fractures of indeterminate chronicity, favor chronic, correlate with directed clinical exam. 4. No cervical spine fracture or subluxation. 5. Marked multilevel degenerative changes in the cervical spine as detailed. 6.  Emphysema (ICD10-J43.9). Electronically Signed   By: Ilona Sorrel M.D.   On: 09/21/2018 23:43   Ct Abdomen Pelvis W Contrast  Result Date: 09/22/2018 CLINICAL DATA:  Status post fall on left side, with left hip pain and left anterior rib pain. Initial encounter. EXAM: CT ABDOMEN AND PELVIS WITH CONTRAST TECHNIQUE: Multidetector CT imaging of the abdomen and pelvis was performed using the standard protocol following bolus administration of intravenous contrast. CONTRAST:  127mL OMNIPAQUE IOHEXOL 300 MG/ML  SOLN COMPARISON:  Pelvic radiograph performed 09/21/2018 FINDINGS: Lower chest: A 4 mm nodule is noted at the left lung base (image 6 of 24). The visualized portions of the mediastinum are unremarkable. Hepatobiliary: The liver is unremarkable in appearance. The gallbladder is unremarkable in appearance. The common bile duct remains normal in caliber. Pancreas: The pancreas is within normal limits. Spleen: The spleen is unremarkable in appearance. Adrenals/Urinary Tract: The adrenal glands are unremarkable  in appearance. The kidneys are within normal limits. There is no evidence of hydronephrosis. No renal or ureteral stones are identified. Mild nonspecific perinephric stranding is noted bilaterally. Stomach/Bowel: The patient is status post partial right hemicolectomy, with an ileocolic anastomosis. The anastomosis is unremarkable in appearance. The remaining colon is grossly unremarkable. The small bowel is unremarkable in appearance. The  stomach is decompressed and unremarkable. Vascular/Lymphatic: Scattered calcification is seen along the abdominal aorta and its branches. The abdominal aorta is otherwise grossly unremarkable. The inferior vena cava is grossly unremarkable. No retroperitoneal lymphadenopathy is seen. No pelvic sidewall lymphadenopathy is identified. Reproductive: The bladder is mildly distended and grossly unremarkable. The prostate remains normal in size, with scattered calcification. Other: Mild chronic postoperative inflammation is noted at the left inguinal region. Musculoskeletal: No acute osseous abnormalities are identified. The left hip joint is unremarkable in appearance. No rib fractures are seen. The patient is status post lumbar spinal fusion at L1-L3. Decompression is noted at L3-L5. The visualized musculature is unremarkable in appearance. IMPRESSION: 1. No evidence of traumatic injury to the abdomen or pelvis. 2. 4 mm nodule at the left lung base. No follow-up needed if patient is low-risk. Non-contrast chest CT can be considered in 12 months if patient is high-risk. This recommendation follows the consensus statement: Guidelines for Management of Incidental Pulmonary Nodules Detected on CT Images: From the Fleischner Society 2017; Radiology 2017; 284:228-243. Aortic Atherosclerosis (ICD10-I70.0). Electronically Signed   By: Garald Balding M.D.   On: 09/22/2018 00:47   Dg Pelvis Portable  Result Date: 09/21/2018 CLINICAL DATA:  Syncope, fall EXAM: PORTABLE PELVIS 1-2 VIEWS  COMPARISON:  None. FINDINGS: Partially visualized hardware in the spine. Surgical changes in the right hemiabdomen. SI joints are non widened. Pubic symphysis and rami are intact. No fracture or malalignment. IMPRESSION: No acute osseous abnormality Electronically Signed   By: Donavan Foil M.D.   On: 09/21/2018 23:29   Dg Chest Port 1 View  Result Date: 09/21/2018 CLINICAL DATA:  Fall, syncope EXAM: PORTABLE CHEST 1 VIEW COMPARISON:  06/30/2013, CT chest 11/01/2013 FINDINGS: Hyperinflation with emphysematous disease and mild fibrosis. No acute consolidation or effusion. Normal cardiomediastinal silhouette with aortic atherosclerosis. No pneumothorax. IMPRESSION: No active disease.  Hyperinflation with emphysematous disease Electronically Signed   By: Donavan Foil M.D.   On: 09/21/2018 23:28    EKG: Independently reviewed.  Junctional rhythm (heart rate 55).  Assessment/Plan Principal Problem:   Syncope Active Problems:   Hyperlipidemia   CAD (coronary artery disease)   Pulmonary nodule-- CT  2015 stable, no further surveillance CTs   Fall   Depression   Syncope -Head CT without acute abnormality. -Pulse in the 50s to 60s.  EKG showing junctional rhythm (heart rate 55).  Monitor on telemetry.  Check TSH.  Cardiology consult has been placed. -Infectious etiology less likely as patient is afebrile and does not have leukocytosis.  Lactic acid 1.9.  Not hypotensive. UA pending.  Chest x-ray without evidence of active disease.  -Blood ethanol level negative.   -Troponin negative and EKG not suggestive of ACS.  Patient is not complaining of chest pain.  Will continue to trend troponin. -PE less likely in the absence of tachycardia and hypoxia. -Check orthostatics -Echocardiogram -Carotid Dopplers  Fall 2/2 syncope X-ray of pelvis without acute abnormality.  CT C-spine negative for acute abnormality.  CT abdomen pelvis showing no evidence of traumatic injury to the abdomen or pelvis. -PT  evaluation  Pulmonary nodule Incidental finding of 4 mm nodule at the left lung base on CT. Prior CT scan results from February 2015 also mentioned a 4 mm nodule in the periphery of the left lower lobe. -Outpatient follow-up  CAD Troponin negative and EKG not suggestive of ACS.  Patient is not complaining of chest pain.  Myoview done in October 2014 was low risk. -Continue to trend troponin  in the setting of syncope -Continue aspirin, statin  Hyperlipidemia -Continue Crestor  Depression, insomnia -Continue Zoloft, trazodone  DVT prophylaxis: Lovenox Code Status: Patient wishes to be full code. Family Communication: No family available. Disposition Plan: Anticipate discharge in 1 to 2 days. Consults called: None Admission status: Observation   Shela Leff MD [facility] Pager [phone]  If 7PM-7AM, please contact night-coverage [email or web address] Password [email or web address]  09/22/2018, 9:07 AM

## 2018-09-22 NOTE — Progress Notes (Signed)
Patient admitted after midnight. In brief hx copd, cad cva admitted for syncope. Not hypotensive no signs infection no metabolic derangements   PE Gen: awake alert oriented CV rrr no mgr no LE edema Resp: no increased work of breathing Abdo: non-distended +BS no guarding   Plan: Syncope work up.  [facility] Cycle trop Echo      Ricardo [name] [name] [name] NP

## 2018-09-22 NOTE — ED Notes (Signed)
Admitting at bedside 

## 2018-09-22 NOTE — Progress Notes (Signed)
Carotid duplex       has been completed. Preliminary results can be found under CV proc through chart review. Lucah Petta, BS, RDMS, RVT   

## 2018-09-22 NOTE — Consult Note (Addendum)
Cardiology Consultation:   Patient ID: Ricardo RAINVILLE Sr.; [id number]; [birth year]   Admit date: 09/21/2018 Date of Consult: 09/22/2018  Primary Care Provider: Colon Branch, MD Primary Cardiologist: Dr. Julianne Handler last seen 2012;   Patient Profile:   Ricardo OESTREICH Sr. is a 71 y.o. 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 Marlowe Sax.  History of Present Illness:   [name] is a 72 year old male with a history stated above presented to [facility] on 09/21/2018 after suffering a mechanical fall  Secondary to sncopal episode. Patient reports he was in his usual state of health until one day prior to admission when he was in the sitting in his recliner and goit up to go to the restroom. The next thing that he remembers is EMS looking over him while still on the floor at his home. He reports that he lives at home with his grandson and his family.  He denies prodromal symptoms such as chest pain, shortness of breath, nausea, vomiting, diaphoresis or recent illness. He reports eating and denies dizziness with change in position. He states that when he woke up, his main complaint since that time has been left hip pain. He states that he has had a prior episode in the past, however was not injured and therefore did not seek medical attention.  His grandson called EMS for transport to the emergency department for further evaluation.  He reports he quit smoking cigarettes approximately 2 years ago and continues with smokeless tobacco.  In the ED, patient's blood pressure was found to be within normal limits along with his heart rate which was in the 50s to 60s.  EKG with junctional rhythm,  HR 55. TSH within normal limits at 1.29. He has no signs of infection including no leukocytosis or fever. Lactic acid was found to be 1.9.  Troponin level has been negative x3.  BNP was normal. CXR with no evidence of acute cardiopulmonary disease. Given his complaints of hip pain, x-ray performed of his pelvis which was without acute abnormality.  Head CT without acute intracranial abnormality. CT C-spine negative for acute abnormality.  Abdominal CT with no evidence of traumatic injury.  Of note, patient was seen remotely back in 2012 by our service during a hospitalization for chest pain.  His troponins were found to be elevated and therefore underwent a cardiac catheterization 03/06/2011 which showed triple-vessel coronary artery disease with patent stents, total occlusion in the RCA with questionable etiology of non-ST elevation MI possible thrombus present which had resolved with IV heparin therapy.  Recommendations at that time were continued medical management with ASA, Plavix, beta-blocker and statin.  He was not seen in follow-up after this admission and unfortunately presented once again in 2013 with chest pain. CT performed which showed atherosclerotic disease within the thoracic aorta as well as coronary arteries, as would be expected based on his history. He was also incidentally noted to have a pulmonary nodule in the right upper lobe, new and measuring 4.8 mm.    Cardiology has been asked to evaluate given his presenting symptom of syncope.   Past Medical History:  Diagnosis Date  . Colon polyp  three 2-71mm polyps in descending colon, medium sized lipoma in sigmoid colon,  multiple 267mm in recto-sigmoid colon  . COPD, mild ([place])     PFTs 12/2008, PFTs 6-15 minimal dz  . Coronary atherosclerosis of native coronary artery    Multivessel, stent LAD 1998, thrombectomy 2006, PTCA 2011, NSTEMI 6/12 managed medically;  LexiScan Myoview (10/14):  Difficult study, inferior scar with peri-infarct  ischemia, inferior HK, EF 45%; low risk  . CVA (cerebral vascular accident) ([place]) 2000, 2001   The latter stoke was reportedly due to emboli from mitral valve vegetation  . Depression   . Full dentures   . Headache(784.0)   . Hyperlipidemia   . NSTEMI (non-ST elevated myocardial infarction) ([place]) 02-2011  . Wears glasses     Past Surgical History:  Procedure Laterality Date  . APPENDECTOMY    . BACK SURGERY  1986  . BALLOON ANGIOPLASTY, ARTERY  04/2010   sp PTCA  . [place]   placed in the LAD  . CORONARY STENT PLACEMENT  02/2005   PCI/drug-eluting stent implantation mid-LAD  . INGUINAL HERNIA REPAIR     x3  . LUMBAR FUSION     x5 Dr. Corrin Parker  . LUMBAR LAMINECTOMY  08/09/2013    L1  L2    Dr Luiz Ochoa  . MASS EXCISION Left 03/09/2013   Procedure: EXCISION left chest wall MASS;  Surgeon: Marcello Moores A. Cornett, MD;  Location: [facility];  Service: General;  Laterality: Left;  . NASAL FRACTURE SURGERY     x2  . 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 THROMBECTOMY  02/2005   acute non ST segment elevation myocardial infarction  . TONSILLECTOMY       Prior to Admission medications   Medication Sig Start Date End Date Taking? Authorizing Provider  aspirin EC 325 MG tablet Take 1 tablet (325 mg total) by mouth daily. 04/07/12  Yes Weaver, Scott T, PA-C  nitroGLYCERIN (NITROSTAT) 0.4 MG SL tablet Place 1 tablet (0.4 mg total) under the tongue every 5 (five) minutes as needed for chest pain. Then contact physician and get emergent medical care. 05/28/16  Yes Paz, Alda Berthold, MD  rosuvastatin (CRESTOR) 40 MG tablet Take 0.5 tablets (20 mg total) by mouth daily. 06/29/18  Yes Paz, Alda Berthold, MD  sertraline (ZOLOFT) 50 MG tablet Take 1 tablet (50 mg total) by mouth daily. 04/20/18  Yes Colon Branch, MD  traZODone (DESYREL) 150 MG tablet Take 2 tablets (300 mg total) by mouth at bedtime as needed for sleep. 06/29/18  Yes Colon Branch, MD    Inpatient  Medications: Scheduled Meds: . aspirin EC  325 mg Oral Daily  . enoxaparin (LOVENOX) injection  40 mg Subcutaneous Q24H  . rosuvastatin  20 mg Oral Daily  . sertraline  50 mg Oral Daily  . sodium chloride flush  3 mL Intravenous Q12H   Continuous Infusions:  PRN Meds: traZODone  Allergies:    Allergies  Allergen Reactions  . Acetaminophen     REACTION: tongue swells  . Codeine     REACTION: Makes "him mean"  . Hydrocodone-Acetaminophen     REACTION: nausea, vomiting "makes me deathly sick"  . Tetracycline     REACTION: loss of balance  . Vicodin [Hydrocodone-Acetaminophen]     Social History:   Social History   Socioeconomic History  . Marital status: Widowed    Spouse name: Not on file  . Number  of children: 3  . Years of education: Not on file  . Highest education level: Not on file  Occupational History  . Occupation: retired from the rail [employer]   Social Needs  . Financial resource strain: Not on file  . Food insecurity:    Worry: Not on file    Inability: Not on file  . Transportation needs:    Medical: Not on file    Non-medical: Not on file  Tobacco Use  . Smoking status: Former Smoker    Packs/day: 0.25    Years: 25.00    Pack years: 6.25    Types: Cigarettes    Last attempt to quit: 07/2016    Years since quitting: 2.1  . Smokeless tobacco: Current User    Types: Chew  . Tobacco comment: quit 07-2016, 1/2 ppd  Substance and Sexual Activity  . Alcohol use: Yes    Alcohol/week: 0.0 standard drinks    Comment: Rarely   . Drug use: No    Comment:    . Sexual activity: Yes  Lifestyle  . Physical activity:    Days per week: Not on file    Minutes per session: Not on file  . Stress: Not on file  Relationships  . Social connections:    Talks on phone: Not on file    Gets together: Not on file    Attends religious service: Not on file    Active member of club or organization: Not on file    Attends meetings of clubs or organizations: Not on  file    Relationship status: Not on file  . Intimate partner violence:    Fear of current or ex partner: Not on file    Emotionally abused: Not on file    Physically abused: Not on file    Forced sexual activity: Not on file  Other Topics Concern  . Not on file  Social History Narrative   Lost 1 son   Household: pt,   g-son Corene Cornea, his wife and child        Family History:   Family History  Problem Relation Age of Onset  . Heart attack Mother   . Lung cancer Father        +smoker  . Diabetes Other        GM, aunt, other memebers  . Prostate cancer Neg Hx           . Colon cancer Neg Hx    Family Status:  Family Status  Relation Name Status  . Mother  Deceased  . Father  Deceased  . Brother  Deceased  . Other  (Not Specified)  . Neg Hx  (Not Specified)    ROS:  Please see the history of present illness.  All other ROS reviewed and negative.     Physical Exam/Data:   Vitals:   09/22/18 0132 09/22/18 0701 09/22/18 0852 09/22/18 1119  BP: (!) 139/93 122/79 129/87 114/78  Pulse: 68 (!) 53 65 (!) 55  Resp: 20 18 18 16   Temp:    97.9 F (36.6 C)  TempSrc:    Oral  SpO2: 93% 96% 97% 96%  Weight:    81.6 kg  Height:    5\' 11"  (1.803 m)    Intake/Output Summary (Last 24 hours) at 09/22/2018 1508 Last data filed at 09/22/2018 1300 Gross per 24 hour  Intake 600 ml  Output -  Net 600 ml   Filed Weights   09/21/18 2241 09/22/18 1119  Weight: 85.7 kg 81.6 kg   Body mass index is 25.08 kg/m.   General: Ill appearing, NAD Skin: Warm, dry, intact  Head: Normocephalic, atraumatic, clear, moist mucus membranes. Neck: Negative for carotid bruits. No JVD Lungs:Clear to ausculation bilaterally. No wheezes, rales, or rhonchi. Breathing is unlabored. Cardiovascular: RRR with S1 S2. No murmurs, rubs, gallops, or LV heave appreciated. Abdomen: Soft, non-tender, non-distended with normoactive bowel sounds. No obvious abdominal masses. MSK: Strength and tone appear  normal for age. 5/5 in all extremities Extremities: No edema. No clubbing or cyanosis. DP/PT pulses 2+ bilaterally Neuro: Alert and oriented. No focal deficits. No facial asymmetry. MAE spontaneously. Psych: Responds to questions appropriately with normal affect.    EKG:  The EKG was personally reviewed and demonstrates: 09/21/18 Junctional with HR 55 and no acute ischemic changes  Telemetry:  Telemetry was personally reviewed and demonstrates: 09/22/18 Sinus Brady HR 50's   Relevant CV Studies:  ECHO: Pending results  CATH: 03/06/2011:  IMPRESSION: 1. Triple-vessel coronary artery disease with patent stents in the mid     circumflex and the proximal left anterior descending artery.  Total     occlusion in the right coronary artery with filling of the distal     vessel via bridging collaterals. 2. Questionable etiology of non-ST elevation myocardial infarction.     It is possible this patient had a thrombus present that resolved     overnight with heparin therapy.  RECOMMENDATIONS:  At this point I would recommend continued medical management with aspirin, Plavix, beta-blocker and statin.  He will be watched closely on telemetry for the next 12-18 hours and then discharge home if he is stable.  CT ANGIOGRAPHY CHEST- 03/23/12 IMPRESSION:  1. No evidence for acute pulmonary embolus or other acute  cardiopulmonary abnormalities.  2. There is atherosclerosis of the thoracic aorta, the great  vessels of the mediastinum and the coronary arteries, including  calcified atherosclerotic plaque in the RCA, LAD and left  circumflex coronary arteries.  Atherosclerosis, including three-vessel coronary artery disease.  Please note that although the presence of coronary artery calcium  documents the presence of coronary artery disease, the severity of  this disease and any potential stenosis cannot be assessed on this  non-gated CT examination. Assessment for potential risk factor   modification, dietary therapy or pharmacologic therapy may be  warranted, if clinically indicated.  3. Pulmonary nodule in the right upper lobe is new from previous  exam measuring 4.8 mm. If the patient is at high risk for  bronchogenic carcinoma, follow-up chest CT at 6-12 months is  recommended. If the patient is at low risk for bronchogenic  carcinoma, follow-up chest CT at 12 months is recommended. This  recommendation follows the consensus statement: Guidelines for  Management of Small Pulmonary Nodules Detected on CT Scans: A  Statement from the [place] as published in Radiology  2005; 237:395-400.  Myocardial Perfusion Study: 2014 Low risk study   Laboratory Data:  Chemistry Recent Labs  Lab 09/21/18 2345 09/21/18 2355  NA 141 142  K 3.9 4.0  CL 106 104  CO2 24  --   GLUCOSE 105* 103*  BUN <5* 8  CREATININE 0.88 0.80  CALCIUM 9.2  --   GFRNONAA >60  --   GFRAA >60  --   ANIONGAP 11  --     Total Protein  Date Value Ref Range Status  09/21/2018 6.5 6.5 - 8.1 g/dL Final   Albumin  Date Value Ref Range  Status  09/21/2018 3.7 3.5 - 5.0 g/dL Final   AST  Date Value Ref Range Status  09/21/2018 21 15 - 41 U/L Final   ALT  Date Value Ref Range Status  09/21/2018 14 0 - 44 U/L Final   Alkaline Phosphatase  Date Value Ref Range Status  09/21/2018 66 38 - 126 U/L Final   Total Bilirubin  Date Value Ref Range Status  09/21/2018 0.8 0.3 - 1.2 mg/dL Final   Hematology Recent Labs  Lab 09/21/18 2345 09/21/18 2355  WBC 8.5  --   RBC 5.03  --   HGB 15.5 16.0  HCT 46.8 47.0  MCV 93.0  --   MCH 30.8  --   MCHC 33.1  --   RDW 12.8  --   PLT 213  --    Cardiac Enzymes Recent Labs  Lab 09/21/18 2345 09/22/18 0832 09/22/18 1256  TROPONINI <0.03 <0.03 <0.03   No results for input(s): TROPIPOC in the last 168 hours.  BNP Recent Labs  Lab 09/21/18 2345  BNP 52.6    DDimer No results for input(s): DDIMER in the last 168 hours. TSH:   Lab Results  Component Value Date   TSH 1.290 09/22/2018   Lipids: Lab Results  Component Value Date   CHOL 100 11/28/2017   HDL 49.30 11/28/2017   LDLCALC 31 11/28/2017   LDLDIRECT 132.8 01/04/2011   TRIG 98.0 11/28/2017   CHOLHDL 2 11/28/2017   HgbA1c: Lab Results  Component Value Date   HGBA1C 5.2 05/24/2016    Radiology/Studies:  Ct Head Wo Contrast  Result Date: 09/21/2018 CLINICAL DATA:  Fall this evening onto left side. Posterior neck pain. Headache. EXAM: CT HEAD WITHOUT CONTRAST CT CERVICAL SPINE WITHOUT CONTRAST TECHNIQUE: Multidetector CT imaging of the head and cervical spine was performed following the standard protocol without intravenous contrast. Multiplanar CT image reconstructions of the cervical spine were also generated. COMPARISON:  03/14/2011 head and cervical spine CT. FINDINGS: CT HEAD FINDINGS Brain: No evidence of parenchymal hemorrhage or extra-axial fluid collection. No mass lesion, mass effect, or midline shift. No CT evidence of acute infarction. Nonspecific mild subcortical and periventricular white matter hypodensity, most in keeping with chronic small vessel ischemic change. Generalized cerebral volume loss. No ventriculomegaly. Vascular: No acute abnormality. Skull: No evidence of calvarial fracture. Bilateral nasal bone fractures of indeterminate chronicity, favor chronic. Sinuses/Orbits: The visualized paranasal sinuses are essentially clear. Other:  The mastoid air cells are unopacified. CT CERVICAL SPINE FINDINGS Alignment: Straightening of the cervical spine. No facet subluxation. Dens is well positioned between the lateral masses of C1. Skull base and vertebrae: No acute fracture. No primary bone lesion or focal pathologic process. Soft tissues and spinal canal: No prevertebral edema. No visible canal hematoma. Disc levels: Marked multilevel degenerative disc disease throughout the cervical spine, most prominent C5-6 and C6-7. Advanced bilateral facet  arthropathy. Mild degenerative foraminal stenosis bilaterally at C3-4 and C5-6. Upper chest: Centrilobular and paraseptal emphysema. Other: Visualized mastoid air cells appear clear. No discrete thyroid nodules. No pathologically enlarged cervical nodes. IMPRESSION: 1. No evidence of acute intracranial abnormality. No evidence of calvarial fracture. 2. Generalized cerebral volume loss and mild chronic small vessel ischemic changes in the cerebral white matter. 3. Bilateral nasal bone fractures of indeterminate chronicity, favor chronic, correlate with directed clinical exam. 4. No cervical spine fracture or subluxation. 5. Marked multilevel degenerative changes in the cervical spine as detailed. 6.  Emphysema (ICD10-J43.9). Electronically Signed   By: Ilona Sorrel  M.D.   On: 09/21/2018 23:43   Ct Cervical Spine Wo Contrast  Result Date: 09/21/2018 CLINICAL DATA:  Fall this evening onto left side. Posterior neck pain. Headache. EXAM: CT HEAD WITHOUT CONTRAST CT CERVICAL SPINE WITHOUT CONTRAST TECHNIQUE: Multidetector CT imaging of the head and cervical spine was performed following the standard protocol without intravenous contrast. Multiplanar CT image reconstructions of the cervical spine were also generated. COMPARISON:  03/14/2011 head and cervical spine CT. FINDINGS: CT HEAD FINDINGS Brain: No evidence of parenchymal hemorrhage or extra-axial fluid collection. No mass lesion, mass effect, or midline shift. No CT evidence of acute infarction. Nonspecific mild subcortical and periventricular white matter hypodensity, most in keeping with chronic small vessel ischemic change. Generalized cerebral volume loss. No ventriculomegaly. Vascular: No acute abnormality. Skull: No evidence of calvarial fracture. Bilateral nasal bone fractures of indeterminate chronicity, favor chronic. Sinuses/Orbits: The visualized paranasal sinuses are essentially clear. Other:  The mastoid air cells are unopacified. CT CERVICAL SPINE  FINDINGS Alignment: Straightening of the cervical spine. No facet subluxation. Dens is well positioned between the lateral masses of C1. Skull base and vertebrae: No acute fracture. No primary bone lesion or focal pathologic process. Soft tissues and spinal canal: No prevertebral edema. No visible canal hematoma. Disc levels: Marked multilevel degenerative disc disease throughout the cervical spine, most prominent C5-6 and C6-7. Advanced bilateral facet arthropathy. Mild degenerative foraminal stenosis bilaterally at C3-4 and C5-6. Upper chest: Centrilobular and paraseptal emphysema. Other: Visualized mastoid air cells appear clear. No discrete thyroid nodules. No pathologically enlarged cervical nodes. IMPRESSION: 1. No evidence of acute intracranial abnormality. No evidence of calvarial fracture. 2. Generalized cerebral volume loss and mild chronic small vessel ischemic changes in the cerebral white matter. 3. Bilateral nasal bone fractures of indeterminate chronicity, favor chronic, correlate with directed clinical exam. 4. No cervical spine fracture or subluxation. 5. Marked multilevel degenerative changes in the cervical spine as detailed. 6.  Emphysema (ICD10-J43.9). Electronically Signed   By: Ilona Sorrel M.D.   On: 09/21/2018 23:43   Ct Abdomen Pelvis W Contrast  Result Date: 09/22/2018 CLINICAL DATA:  Status post fall on left side, with left hip pain and left anterior rib pain. Initial encounter. EXAM: CT ABDOMEN AND PELVIS WITH CONTRAST TECHNIQUE: Multidetector CT imaging of the abdomen and pelvis was performed using the standard protocol following bolus administration of intravenous contrast. CONTRAST:  138mL OMNIPAQUE IOHEXOL 300 MG/ML  SOLN COMPARISON:  Pelvic radiograph performed 09/21/2018 FINDINGS: Lower chest: A 4 mm nodule is noted at the left lung base (image 6 of 24). The visualized portions of the mediastinum are unremarkable. Hepatobiliary: The liver is unremarkable in appearance. The  gallbladder is unremarkable in appearance. The common bile duct remains normal in caliber. Pancreas: The pancreas is within normal limits. Spleen: The spleen is unremarkable in appearance. Adrenals/Urinary Tract: The adrenal glands are unremarkable in appearance. The kidneys are within normal limits. There is no evidence of hydronephrosis. No renal or ureteral stones are identified. Mild nonspecific perinephric stranding is noted bilaterally. Stomach/Bowel: The patient is status post partial right hemicolectomy, with an ileocolic anastomosis. The anastomosis is unremarkable in appearance. The remaining colon is grossly unremarkable. The small bowel is unremarkable in appearance. The stomach is decompressed and unremarkable. Vascular/Lymphatic: Scattered calcification is seen along the abdominal aorta and its branches. The abdominal aorta is otherwise grossly unremarkable. The inferior vena cava is grossly unremarkable. No retroperitoneal lymphadenopathy is seen. No pelvic sidewall lymphadenopathy is identified. Reproductive: The bladder is mildly distended and  grossly unremarkable. The prostate remains normal in size, with scattered calcification. Other: Mild chronic postoperative inflammation is noted at the left inguinal region. Musculoskeletal: No acute osseous abnormalities are identified. The left hip joint is unremarkable in appearance. No rib fractures are seen. The patient is status post lumbar spinal fusion at L1-L3. Decompression is noted at L3-L5. The visualized musculature is unremarkable in appearance. IMPRESSION: 1. No evidence of traumatic injury to the abdomen or pelvis. 2. 4 mm nodule at the left lung base. No follow-up needed if patient is low-risk. Non-contrast chest CT can be considered in 12 months if patient is high-risk. This recommendation follows the consensus statement: Guidelines for Management of Incidental Pulmonary Nodules Detected on CT Images: From the [employer]ischner Society 2017;  Radiology 2017; 284:228-243. Aortic Atherosclerosis (ICD10-I70.0). Electronically Signed   By: Garald Balding M.D.   On: 09/22/2018 00:47   Dg Pelvis Portable  Result Date: 09/21/2018 CLINICAL DATA:  Syncope, fall EXAM: PORTABLE PELVIS 1-2 VIEWS COMPARISON:  None. FINDINGS: Partially visualized hardware in the spine. Surgical changes in the right hemiabdomen. SI joints are non widened. Pubic symphysis and rami are intact. No fracture or malalignment. IMPRESSION: No acute osseous abnormality Electronically Signed   By: Donavan Foil M.D.   On: 09/21/2018 23:29   Dg Chest Port 1 View  Result Date: 09/21/2018 CLINICAL DATA:  Fall, syncope EXAM: PORTABLE CHEST 1 VIEW COMPARISON:  06/30/2013, CT chest 11/01/2013 FINDINGS: Hyperinflation with emphysematous disease and mild fibrosis. No acute consolidation or effusion. Normal cardiomediastinal silhouette with aortic atherosclerosis. No pneumothorax. IMPRESSION: No active disease.  Hyperinflation with emphysematous disease Electronically Signed   By: Donavan Foil M.D.   On: 09/21/2018 23:28   Vas US Carotid  Result Date: 09/22/2018 Carotid Arterial Duplex Study Indications: Syncope. Performing Technologist: June Leap RDMS, RVT  Examination Guidelines: A complete evaluation includes B-mode imaging, spectral Doppler, color Doppler, and power Doppler as needed of all accessible portions of each vessel. Bilateral testing is considered an integral part of a complete examination. Limited examinations for reoccurring indications may be performed as noted.  Right Carotid Findings: +----------+--------+--------+--------+------------+--------+           PSV cm/sEDV cm/sStenosisDescribe    Comments +----------+--------+--------+--------+------------+--------+ CCA Prox  60      17                                   +----------+--------+--------+--------+------------+--------+ CCA Distal66      13              heterogenous          +----------+--------+--------+--------+------------+--------+ ICA Prox  47      14      1-39%   heterogenous         +----------+--------+--------+--------+------------+--------+ ICA Distal66      23                                   +----------+--------+--------+--------+------------+--------+ ECA       68      12                                   +----------+--------+--------+--------+------------+--------+ +----------+--------+-------+----------------+-------------------+           PSV cm/sEDV cmsDescribe        Arm Pressure (mmHG) +----------+--------+-------+----------------+-------------------+  Subclavian122            Multiphasic, WNL                    +----------+--------+-------+----------------+-------------------+ +---------+--------+--+--------+-+---------+ VertebralPSV cm/s63EDV cm/s8Antegrade +---------+--------+--+--------+-+---------+  Left Carotid Findings: +----------+--------+--------+--------+------------+--------+           PSV cm/sEDV cm/sStenosisDescribe    Comments +----------+--------+--------+--------+------------+--------+ CCA Prox  87      16                                   +----------+--------+--------+--------+------------+--------+ CCA Distal70      9                                    +----------+--------+--------+--------+------------+--------+ ICA Prox  64      22      1-39%   heterogenous         +----------+--------+--------+--------+------------+--------+ ICA Distal86      19                                   +----------+--------+--------+--------+------------+--------+ ECA       95      13              heterogenous         +----------+--------+--------+--------+------------+--------+ +----------+--------+--------+----------------+-------------------+ SubclavianPSV cm/sEDV cm/sDescribe        Arm Pressure (mmHG) +----------+--------+--------+----------------+-------------------+            215             Multiphasic, WNL                    +----------+--------+--------+----------------+-------------------+ +---------+--------+--+--------+-+---------+ VertebralPSV cm/s41EDV cm/s9Antegrade +---------+--------+--+--------+-+---------+  Summary: Right Carotid: Velocities in the right ICA are consistent with a 1-39% stenosis. Left Carotid: Velocities in the left ICA are consistent with a 1-39% stenosis.  *See table(s) above for measurements and observations.     Preliminary    Assessment and Plan:   1.  Syncope: -Patient presented to [facility] 09/21/2018 after suffering a mechanical fall secondary to syncopal episode. Patient reported that he was in his usual state of health and had no prodromal symptoms prior to his episode. He was in the sitting position in his recliner and was on his way to the restroom. After moving to the standing position, he blacked out and woke on the floor with left-sided hip pain. He was brought to the emergency department for further evaluation. -In the ED, BP and heart rate were within normal limits -EKG 09/21/18 showed junctional rhythm with a heart rate of 55 although difficult to determine based on EKG  -SB per tele review now with HR 50's  -Troponin, negative x3 with no anginal complaints  -TSH within normal limits -CXR with no acute abnormality -Not on any AV blocking medications -Dopplers performed today with preliminary report showing right and the left ICA with 1-39% stenosis -Echocardiogram performed today with LVEF of 55 to 60% with normal wall motion and G1 DD. Per study report, there was increased relative contribution of atrial contraction to ventricular filling which may be due to aging or hypovolemia>> Na+, 142 and creatinine 0.80 -Lactic acid found to be mildly elevated at 1.99 -Orthostatics are as follows: Lying-141/97 with HR 58; Sitting-131/71 with HR 65; Standing-103/80 with HR  91 at 0 minutes and 112/81 with HR 97 at 3 minutes    -Avoid AV blocking agents/antihypertensives for now  -Continue to monitor on telemetry for bradycardia  -No recurrent symptoms since hospital admission  -Will benefit from IV hydration and compression stockings. If he continues to be symptomatic, would consider OP abdominal binder. Will likely need OP event monitor at d/c to fully evaluate for arrhthymias.   2.  Fall secondary to syncope: -Patient had complete post trauma work-up with head, C-spine and abdominal CT scans which were all negative for acute abnormalities  3. Known multivessel CAD: -As outlined above.  Has not had regular cardiac follow-up in many years -On home ASA 325 secondary to CVA, rosuvastatin 40  3. COPD with active tobacco use: -Stable, per IM   4. Pulmonary nodule: -Incidental finding from prior chest CT>> concerning in light of his active tobacco use -Was previously advised to follow up in the OP setting however it appears that this was never completed  5. History of hyperlipidemia: -Continue statin -Check lipid panel    For questions or updates, please contact [place] Please consult [email or web address] for contact info under Cardiology/STEMI.   Lyndel Safe NP-C [place] Pager: [phone] 09/22/2018 3:08 PM       Patient seen and examined. Agree with assessment and plan.  [name] is a 72 year old gentleman who has a history of established CAD and underwent initial LAD stenting in 1998, thrombectomy in 2006, PTCA in 2011, and at last catheterization following a NSTEMI in 2012 he was found to have three-vessel CAD with patent stents in the mid circumflex, proximal LAD, and his RCA was totally occluded with distal collateral filling.  He has been on medical therapy.  He has a history of remote CVA mitral valve vegetation, hyperlipidemia as well as COPD.  He last saw Dr. Angelena Form approximately 6 to 7 years ago.  He has been doing well and has not had any recurrent anginal  symptomatology.  He was admitted following an episode of syncope.  He denied any prodrome of chest pain or awareness of any arrhythmia.  He had gotten up from sitting in a recliner and was going to go to the bathroom when he fell on the floor and the next thing he remembered was EMS was there.  He has a history of longstanding smoking, quit 2 years ago but has used smokeless tobacco.  On exam today, he is orthostatic lying blood pressure 141/97, pulse 58, sitting blood pressure 131/71 with heart rate 65, and with standing blood pressure dropped to 103/80 and heart rate increased to 91.  He denies any recent chest pain.  He denies any exertional dyspnea.  His CT scan of his head did not reveal any acute intracranial abnormality.  He had generalized cerebral volume loss with chronic small vessel ischemic changes in the cerebral white matter.  He had marked multilevel degenerative changes in his cervical spine.  There was evidence for emphysema.  His abdominals and pelvis CT showed a 4 mm nodule at the left lung base.  Carotid Doppler studies did not reveal high-grade ICA stenoses.  There was bilateral antegrade vertebral artery flow.  ENT is unremarkable.  I did not appreciate any significant carotid bruits.  There is no JVD.  He has decreased breath sounds to his lungs without wheezing.  Rhythm is regular with 1/6 systolic murmur.  Abdomen is soft and nontender.  Distal pulses were adequate.  There was no significant edema.  Neurologic  neurologic exam without focal findings.  His ECG has shown junctional rhythm with heart rate at 55 without ischemic changes.  Subsequent ECG showed sinus bradycardia with heart rate in the 50s.  He has been maintained on aspirin and rosuvastatin.  Presently recommend support stockings with 20 to 30 mm pressure support.  Gentle hydration. Carotid studies argue against vertebrobasilar insufficiency.  Cycle troponin.  Cardiac monitor with probable need for outpatient monitoring.  If  continued orthostasis consider initiation of low-dose Midodrin.  Will follow.   Troy Sine, MD, [facility] 09/22/2018 6:45 PM

## 2018-09-22 NOTE — ED Notes (Signed)
Report given to floor nurse. PT transported via tele monitor to floor.

## 2018-09-22 NOTE — Progress Notes (Signed)
  Echocardiogram 2D Echocardiogram has been performed.  [name] 09/22/2018, 12:11 PM

## 2018-09-23 DIAGNOSIS — R55 Syncope and collapse: Secondary | ICD-10-CM | POA: Diagnosis not present

## 2018-09-23 LAB — GLUCOSE, CAPILLARY: Glucose-Capillary: 83 mg/dL (ref 70–99)

## 2018-09-23 MED ORDER — SODIUM CHLORIDE 0.9 % IV BOLUS
500.0000 mL | Freq: Once | INTRAVENOUS | Status: AC
  Administered 2018-09-23: 500 mL via INTRAVENOUS

## 2018-09-23 NOTE — Progress Notes (Signed)
Progress Note  Patient Name: Ricardo DEVER Sr. Date of Encounter: 09/23/2018  Primary Cardiologist: Claiborne Billings  Subjective   No cardiac complaints   Inpatient Medications    Scheduled Meds: . aspirin EC  325 mg Oral Daily  . enoxaparin (LOVENOX) injection  40 mg Subcutaneous Q24H  . rosuvastatin  20 mg Oral Daily  . sertraline  50 mg Oral Daily  . sodium chloride flush  3 mL Intravenous Q12H   Continuous Infusions: . sodium chloride     PRN Meds: ibuprofen, traZODone   Vital Signs    Vitals:   09/22/18 1637 09/22/18 1805 09/23/18 0406 09/23/18 0600  BP: (!) 129/91 (!) 143/82 109/67   Pulse: 60 (!) 57 (!) 55   Resp: 17 16 16    Temp: 98.1 F (36.7 C) 98.1 F (36.7 C) (!) 97.5 F (36.4 C)   TempSrc: Oral Oral Oral   SpO2: 93% 94% 91%   Weight:    81.2 kg  Height:        Intake/Output Summary (Last 24 hours) at 09/23/2018 0743 Last data filed at 09/22/2018 2147 Gross per 24 hour  Intake 603 ml  Output 650 ml  Net -47 ml   Filed Weights   09/21/18 2241 09/22/18 1119 09/23/18 0600  Weight: 85.7 kg 81.6 kg 81.2 kg    Telemetry    SR/SB no junctional 5 beats NSVT - Personally Reviewed  ECG    sB 55 read as junctional  - Personally Reviewed  Physical Exam  Chronically ill white male  GEN: No acute distress.   Neck: No JVD Cardiac: RRR, no murmurs, rubs, or gallops.  Respiratory: COPD no active wheezing  GI: Soft, nontender, non-distended  MS: No edema; No deformity. Neuro:  Nonfocal  Psych: Normal affect   Labs    Chemistry Recent Labs  Lab 09/21/18 2345 09/21/18 2355  NA 141 142  K 3.9 4.0  CL 106 104  CO2 24  --   GLUCOSE 105* 103*  BUN <5* 8  CREATININE 0.88 0.80  CALCIUM 9.2  --   PROT 6.5  --   ALBUMIN 3.7  --   AST 21  --   ALT 14  --   ALKPHOS 66  --   BILITOT 0.8  --   GFRNONAA >60  --   GFRAA >60  --   ANIONGAP 11  --      Hematology Recent Labs  Lab 09/21/18 2345 09/21/18 2355  WBC 8.5  --   RBC 5.03  --   HGB  15.5 16.0  HCT 46.8 47.0  MCV 93.0  --   MCH 30.8  --   MCHC 33.1  --   RDW 12.8  --   PLT 213  --     Cardiac Enzymes Recent Labs  Lab 09/21/18 2345 09/22/18 0832 09/22/18 1256 09/22/18 2005  TROPONINI <0.03 <0.03 <0.03 <0.03   No results for input(s): TROPIPOC in the last 168 hours.   BNP Recent Labs  Lab 09/21/18 2345  BNP 52.6     DDimer No results for input(s): DDIMER in the last 168 hours.   Radiology    Ct Head Wo Contrast  Result Date: 09/21/2018 CLINICAL DATA:  Fall this evening onto left side. Posterior neck pain. Headache. EXAM: CT HEAD WITHOUT CONTRAST CT CERVICAL SPINE WITHOUT CONTRAST TECHNIQUE: Multidetector CT imaging of the head and cervical spine was performed following the standard protocol without intravenous contrast. Multiplanar CT image reconstructions of the cervical  spine were also generated. COMPARISON:  03/14/2011 head and cervical spine CT. FINDINGS: CT HEAD FINDINGS Brain: No evidence of parenchymal hemorrhage or extra-axial fluid collection. No mass lesion, mass effect, or midline shift. No CT evidence of acute infarction. Nonspecific mild subcortical and periventricular white matter hypodensity, most in keeping with chronic small vessel ischemic change. Generalized cerebral volume loss. No ventriculomegaly. Vascular: No acute abnormality. Skull: No evidence of calvarial fracture. Bilateral nasal bone fractures of indeterminate chronicity, favor chronic. Sinuses/Orbits: The visualized paranasal sinuses are essentially clear. Other:  The mastoid air cells are unopacified. CT CERVICAL SPINE FINDINGS Alignment: Straightening of the cervical spine. No facet subluxation. Dens is well positioned between the lateral masses of C1. Skull base and vertebrae: No acute fracture. No primary bone lesion or focal pathologic process. Soft tissues and spinal canal: No prevertebral edema. No visible canal hematoma. Disc levels: Marked multilevel degenerative disc disease  throughout the cervical spine, most prominent C5-6 and C6-7. Advanced bilateral facet arthropathy. Mild degenerative foraminal stenosis bilaterally at C3-4 and C5-6. Upper chest: Centrilobular and paraseptal emphysema. Other: Visualized mastoid air cells appear clear. No discrete thyroid nodules. No pathologically enlarged cervical nodes. IMPRESSION: 1. No evidence of acute intracranial abnormality. No evidence of calvarial fracture. 2. Generalized cerebral volume loss and mild chronic small vessel ischemic changes in the cerebral white matter. 3. Bilateral nasal bone fractures of indeterminate chronicity, favor chronic, correlate with directed clinical exam. 4. No cervical spine fracture or subluxation. 5. Marked multilevel degenerative changes in the cervical spine as detailed. 6.  Emphysema (ICD10-J43.9). Electronically Signed   By: Ilona Sorrel M.D.   On: 09/21/2018 23:43   Ct Cervical Spine Wo Contrast  Result Date: 09/21/2018 CLINICAL DATA:  Fall this evening onto left side. Posterior neck pain. Headache. EXAM: CT HEAD WITHOUT CONTRAST CT CERVICAL SPINE WITHOUT CONTRAST TECHNIQUE: Multidetector CT imaging of the head and cervical spine was performed following the standard protocol without intravenous contrast. Multiplanar CT image reconstructions of the cervical spine were also generated. COMPARISON:  03/14/2011 head and cervical spine CT. FINDINGS: CT HEAD FINDINGS Brain: No evidence of parenchymal hemorrhage or extra-axial fluid collection. No mass lesion, mass effect, or midline shift. No CT evidence of acute infarction. Nonspecific mild subcortical and periventricular white matter hypodensity, most in keeping with chronic small vessel ischemic change. Generalized cerebral volume loss. No ventriculomegaly. Vascular: No acute abnormality. Skull: No evidence of calvarial fracture. Bilateral nasal bone fractures of indeterminate chronicity, favor chronic. Sinuses/Orbits: The visualized paranasal sinuses  are essentially clear. Other:  The mastoid air cells are unopacified. CT CERVICAL SPINE FINDINGS Alignment: Straightening of the cervical spine. No facet subluxation. Dens is well positioned between the lateral masses of C1. Skull base and vertebrae: No acute fracture. No primary bone lesion or focal pathologic process. Soft tissues and spinal canal: No prevertebral edema. No visible canal hematoma. Disc levels: Marked multilevel degenerative disc disease throughout the cervical spine, most prominent C5-6 and C6-7. Advanced bilateral facet arthropathy. Mild degenerative foraminal stenosis bilaterally at C3-4 and C5-6. Upper chest: Centrilobular and paraseptal emphysema. Other: Visualized mastoid air cells appear clear. No discrete thyroid nodules. No pathologically enlarged cervical nodes. IMPRESSION: 1. No evidence of acute intracranial abnormality. No evidence of calvarial fracture. 2. Generalized cerebral volume loss and mild chronic small vessel ischemic changes in the cerebral white matter. 3. Bilateral nasal bone fractures of indeterminate chronicity, favor chronic, correlate with directed clinical exam. 4. No cervical spine fracture or subluxation. 5. Marked multilevel degenerative changes in the  cervical spine as detailed. 6.  Emphysema (ICD10-J43.9). Electronically Signed   By: Ilona Sorrel M.D.   On: 09/21/2018 23:43   Ct Abdomen Pelvis W Contrast  Result Date: 09/22/2018 CLINICAL DATA:  Status post fall on left side, with left hip pain and left anterior rib pain. Initial encounter. EXAM: CT ABDOMEN AND PELVIS WITH CONTRAST TECHNIQUE: Multidetector CT imaging of the abdomen and pelvis was performed using the standard protocol following bolus administration of intravenous contrast. CONTRAST:  192mL OMNIPAQUE IOHEXOL 300 MG/ML  SOLN COMPARISON:  Pelvic radiograph performed 09/21/2018 FINDINGS: Lower chest: A 4 mm nodule is noted at the left lung base (image 6 of 24). The visualized portions of the  mediastinum are unremarkable. Hepatobiliary: The liver is unremarkable in appearance. The gallbladder is unremarkable in appearance. The common bile duct remains normal in caliber. Pancreas: The pancreas is within normal limits. Spleen: The spleen is unremarkable in appearance. Adrenals/Urinary Tract: The adrenal glands are unremarkable in appearance. The kidneys are within normal limits. There is no evidence of hydronephrosis. No renal or ureteral stones are identified. Mild nonspecific perinephric stranding is noted bilaterally. Stomach/Bowel: The patient is status post partial right hemicolectomy, with an ileocolic anastomosis. The anastomosis is unremarkable in appearance. The remaining colon is grossly unremarkable. The small bowel is unremarkable in appearance. The stomach is decompressed and unremarkable. Vascular/Lymphatic: Scattered calcification is seen along the abdominal aorta and its branches. The abdominal aorta is otherwise grossly unremarkable. The inferior vena cava is grossly unremarkable. No retroperitoneal lymphadenopathy is seen. No pelvic sidewall lymphadenopathy is identified. Reproductive: The bladder is mildly distended and grossly unremarkable. The prostate remains normal in size, with scattered calcification. Other: Mild chronic postoperative inflammation is noted at the left inguinal region. Musculoskeletal: No acute osseous abnormalities are identified. The left hip joint is unremarkable in appearance. No rib fractures are seen. The patient is status post lumbar spinal fusion at L1-L3. Decompression is noted at L3-L5. The visualized musculature is unremarkable in appearance. IMPRESSION: 1. No evidence of traumatic injury to the abdomen or pelvis. 2. 4 mm nodule at the left lung base. No follow-up needed if patient is low-risk. Non-contrast chest CT can be considered in 12 months if patient is high-risk. This recommendation follows the consensus statement: Guidelines for Management of  Incidental Pulmonary Nodules Detected on CT Images: From the Fleischner Society 2017; Radiology 2017; 284:228-243. Aortic Atherosclerosis (ICD10-I70.0). Electronically Signed   By: Garald Balding M.D.   On: 09/22/2018 00:47   Dg Pelvis Portable  Result Date: 09/21/2018 CLINICAL DATA:  Syncope, fall EXAM: PORTABLE PELVIS 1-2 VIEWS COMPARISON:  None. FINDINGS: Partially visualized hardware in the spine. Surgical changes in the right hemiabdomen. SI joints are non widened. Pubic symphysis and rami are intact. No fracture or malalignment. IMPRESSION: No acute osseous abnormality Electronically Signed   By: Donavan Foil M.D.   On: 09/21/2018 23:29   Dg Chest Port 1 View  Result Date: 09/21/2018 CLINICAL DATA:  Fall, syncope EXAM: PORTABLE CHEST 1 VIEW COMPARISON:  06/30/2013, CT chest 11/01/2013 FINDINGS: Hyperinflation with emphysematous disease and mild fibrosis. No acute consolidation or effusion. Normal cardiomediastinal silhouette with aortic atherosclerosis. No pneumothorax. IMPRESSION: No active disease.  Hyperinflation with emphysematous disease Electronically Signed   By: Donavan Foil M.D.   On: 09/21/2018 23:28   Vas US Carotid  Result Date: 09/22/2018 Carotid Arterial Duplex Study Indications: Syncope. Performing Technologist: June Leap RDMS, RVT  Examination Guidelines: A complete evaluation includes B-mode imaging, spectral Doppler, color Doppler, and power  Doppler as needed of all accessible portions of each vessel. Bilateral testing is considered an integral part of a complete examination. Limited examinations for reoccurring indications may be performed as noted.  Right Carotid Findings: +----------+--------+--------+--------+------------+--------+           PSV cm/sEDV cm/sStenosisDescribe    Comments +----------+--------+--------+--------+------------+--------+ CCA Prox  60      17                                    +----------+--------+--------+--------+------------+--------+ CCA Distal66      13              heterogenous         +----------+--------+--------+--------+------------+--------+ ICA Prox  47      14      1-39%   heterogenous         +----------+--------+--------+--------+------------+--------+ ICA Distal66      23                                   +----------+--------+--------+--------+------------+--------+ ECA       68      12                                   +----------+--------+--------+--------+------------+--------+ +----------+--------+-------+----------------+-------------------+           PSV cm/sEDV cmsDescribe        Arm Pressure (mmHG) +----------+--------+-------+----------------+-------------------+ Subclavian122            Multiphasic, WNL                    +----------+--------+-------+----------------+-------------------+ +---------+--------+--+--------+-+---------+ VertebralPSV cm/s63EDV cm/s8Antegrade +---------+--------+--+--------+-+---------+  Left Carotid Findings: +----------+--------+--------+--------+------------+--------+           PSV cm/sEDV cm/sStenosisDescribe    Comments +----------+--------+--------+--------+------------+--------+ CCA Prox  87      16                                   +----------+--------+--------+--------+------------+--------+ CCA Distal70      9                                    +----------+--------+--------+--------+------------+--------+ ICA Prox  64      22      1-39%   heterogenous         +----------+--------+--------+--------+------------+--------+ ICA Distal86      19                                   +----------+--------+--------+--------+------------+--------+ ECA       95      13              heterogenous         +----------+--------+--------+--------+------------+--------+ +----------+--------+--------+----------------+-------------------+ SubclavianPSV  cm/sEDV cm/sDescribe        Arm Pressure (mmHG) +----------+--------+--------+----------------+-------------------+           215             Multiphasic, WNL                    +----------+--------+--------+----------------+-------------------+ +---------+--------+--+--------+-+---------+ VertebralPSV cm/s41EDV cm/s9Antegrade +---------+--------+--+--------+-+---------+  Summary: Right Carotid: Velocities in the right ICA are consistent with a 1-39% stenosis. Left Carotid: Velocities in the left ICA are consistent with a 1-39% stenosis.  *See table(s) above for measurements and observations.  Electronically signed by Servando Snare MD on 09/22/2018 at 5:10:30 PM.    Final     Cardiac Studies   TTE EF 55-60% no valve disease  Patient Profile     JEWELL RYANS Sr. is a 73 y.o. 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 Marlowe Sax.  Assessment & Plan    Syncope:  Postural improved no angina or signs of ischemia telemetry with no AV block TTE with normal EF No signs of cardiac etiology Can f/u with Dr Claiborne Billings on d/c and consider 30 day event monitoring and outpatient myovue       For questions or updates, please contact [facility] Please consult [email or web address] for contact info under        Signed, Jenkins Rouge, MD  09/23/2018, 7:43 AM

## 2018-09-23 NOTE — Care Management Obs Status (Signed)
[place] NOTIFICATION   Patient Details  Name: Ricardo ALICEA Sr. MRN: [id number] Date of Birth: 06-Dec-1945   Medicare Observation Status Notification Given:  (Pt refused to sign)    Royston Bake, RN 09/23/2018, 9:42 AM

## 2018-09-23 NOTE — Discharge Summary (Signed)
Physician Discharge Summary  Ricardo BOSHER Sr. VEL:[id number] DOB: 1946-07-01 DOA: 09/21/2018  PCP: Colon Branch, MD  Admit date: 09/21/2018 Discharge date: 09/23/2018  Admitted From: home Discharge disposition: home   Recommendations for Outpatient Follow-Up:   1. Compression stockings 2. outpatient cardiology follow up   Discharge Diagnosis:   Principal Problem:   Syncope Active Problems:   Hyperlipidemia   CAD (coronary artery disease)   Pulmonary nodule-- CT  2015 stable, no further surveillance CTs   Fall   Depression    Discharge Condition: Improved.  Diet recommendation:  Regular.  Wound care: None.  Code status: Full.   History of Present Illness:   Ricardo TIPPETT Sr. is a 73 y.o. male with medical history significant of COPD, CAD, CVA, hyperlipidemia presenting to the hospital via EMS for evaluation of fall and syncope.  Patient states he was in his usual state of health until yesterday morning when he felt a little dizzy.  In the afternoon he was getting up to go to the bathroom and all of a sudden blacked out and fell.  Denies having any chest pain, dizziness, or shortness of breath prior to the fall.  States when he woke up he was having a lot of pain in his left hip.  His grandson found him on the floor.  He is not sure for how long he was unconscious.  No further history could be obtained from the patient.    Hospital Course by Problem:   Syncope-- appears to be orthostatic - no angina or signs of ischemia telemetry with no AV block  -echo with normal EF -per cardiology doubt cardiac etiology Can f/u with Dr Claiborne Billings on d/c and consider 30 day event monitoring and outpatient myovue  -TED hose with compression  Pulmonary nodule Incidental finding of 4 mm nodule at the left lung base on CT. Prior CT scan results from February 2015 also mentioned a 4 mm nodule in the periphery of the left lower lobe. -Outpatient follow-up  CAD -CE  negative -Continue aspirin, statin  Hyperlipidemia -Continue Crestor  Depression, insomnia -Continue Zoloft, trazodone  Medical Consultants:    cards  Discharge Exam:   Vitals:   09/23/18 0817 09/23/18 1128  BP: 125/65   Pulse: (!) 56   Resp:  18  Temp:  (!) 97.5 F (36.4 C)  SpO2: 94% 95%   Vitals:   09/23/18 0406 09/23/18 0600 09/23/18 0817 09/23/18 1128  BP: 109/67  125/65   Pulse: (!) 55  (!) 56   Resp: 16   18  Temp: (!) 97.5 F (36.4 C)   (!) 97.5 F (36.4 C)  TempSrc: Oral   Oral  SpO2: 91%  94% 95%  Weight:  81.2 kg    Height:        General exam: Appears calm and comfortable. Hard of hearing   The results of significant diagnostics from this hospitalization (including imaging, microbiology, ancillary and laboratory) are listed below for reference.     Procedures and Diagnostic Studies:   Ct Head Wo Contrast  Result Date: 09/21/2018 CLINICAL DATA:  Fall this evening onto left side. Posterior neck pain. Headache. EXAM: CT HEAD WITHOUT CONTRAST CT CERVICAL SPINE WITHOUT CONTRAST TECHNIQUE: Multidetector CT imaging of the head and cervical spine was performed following the standard protocol without intravenous contrast. Multiplanar CT image reconstructions of the cervical spine were also generated. COMPARISON:  03/14/2011 head and cervical spine CT. FINDINGS: CT HEAD FINDINGS Brain:  No evidence of parenchymal hemorrhage or extra-axial fluid collection. No mass lesion, mass effect, or midline shift. No CT evidence of acute infarction. Nonspecific mild subcortical and periventricular white matter hypodensity, most in keeping with chronic small vessel ischemic change. Generalized cerebral volume loss. No ventriculomegaly. Vascular: No acute abnormality. Skull: No evidence of calvarial fracture. Bilateral nasal bone fractures of indeterminate chronicity, favor chronic. Sinuses/Orbits: The visualized paranasal sinuses are essentially clear. Other:  The mastoid air  cells are unopacified. CT CERVICAL SPINE FINDINGS Alignment: Straightening of the cervical spine. No facet subluxation. Dens is well positioned between the lateral masses of C1. Skull base and vertebrae: No acute fracture. No primary bone lesion or focal pathologic process. Soft tissues and spinal canal: No prevertebral edema. No visible canal hematoma. Disc levels: Marked multilevel degenerative disc disease throughout the cervical spine, most prominent C5-6 and C6-7. Advanced bilateral facet arthropathy. Mild degenerative foraminal stenosis bilaterally at C3-4 and C5-6. Upper chest: Centrilobular and paraseptal emphysema. Other: Visualized mastoid air cells appear clear. No discrete thyroid nodules. No pathologically enlarged cervical nodes. IMPRESSION: 1. No evidence of acute intracranial abnormality. No evidence of calvarial fracture. 2. Generalized cerebral volume loss and mild chronic small vessel ischemic changes in the cerebral white matter. 3. Bilateral nasal bone fractures of indeterminate chronicity, favor chronic, correlate with directed clinical exam. 4. No cervical spine fracture or subluxation. 5. Marked multilevel degenerative changes in the cervical spine as detailed. 6.  Emphysema (ICD10-J43.9). Electronically Signed   By: Ilona Sorrel M.D.   On: 09/21/2018 23:43   Ct Cervical Spine Wo Contrast  Result Date: 09/21/2018 CLINICAL DATA:  Fall this evening onto left side. Posterior neck pain. Headache. EXAM: CT HEAD WITHOUT CONTRAST CT CERVICAL SPINE WITHOUT CONTRAST TECHNIQUE: Multidetector CT imaging of the head and cervical spine was performed following the standard protocol without intravenous contrast. Multiplanar CT image reconstructions of the cervical spine were also generated. COMPARISON:  03/14/2011 head and cervical spine CT. FINDINGS: CT HEAD FINDINGS Brain: No evidence of parenchymal hemorrhage or extra-axial fluid collection. No mass lesion, mass effect, or midline shift. No CT  evidence of acute infarction. Nonspecific mild subcortical and periventricular white matter hypodensity, most in keeping with chronic small vessel ischemic change. Generalized cerebral volume loss. No ventriculomegaly. Vascular: No acute abnormality. Skull: No evidence of calvarial fracture. Bilateral nasal bone fractures of indeterminate chronicity, favor chronic. Sinuses/Orbits: The visualized paranasal sinuses are essentially clear. Other:  The mastoid air cells are unopacified. CT CERVICAL SPINE FINDINGS Alignment: Straightening of the cervical spine. No facet subluxation. Dens is well positioned between the lateral masses of C1. Skull base and vertebrae: No acute fracture. No primary bone lesion or focal pathologic process. Soft tissues and spinal canal: No prevertebral edema. No visible canal hematoma. Disc levels: Marked multilevel degenerative disc disease throughout the cervical spine, most prominent C5-6 and C6-7. Advanced bilateral facet arthropathy. Mild degenerative foraminal stenosis bilaterally at C3-4 and C5-6. Upper chest: Centrilobular and paraseptal emphysema. Other: Visualized mastoid air cells appear clear. No discrete thyroid nodules. No pathologically enlarged cervical nodes. IMPRESSION: 1. No evidence of acute intracranial abnormality. No evidence of calvarial fracture. 2. Generalized cerebral volume loss and mild chronic small vessel ischemic changes in the cerebral white matter. 3. Bilateral nasal bone fractures of indeterminate chronicity, favor chronic, correlate with directed clinical exam. 4. No cervical spine fracture or subluxation. 5. Marked multilevel degenerative changes in the cervical spine as detailed. 6.  Emphysema (ICD10-J43.9). Electronically Signed   By: Janina Mayo.D.  On: 09/21/2018 23:43   Ct Abdomen Pelvis W Contrast  Result Date: 09/22/2018 CLINICAL DATA:  Status post fall on left side, with left hip pain and left anterior rib pain. Initial encounter. EXAM: CT  ABDOMEN AND PELVIS WITH CONTRAST TECHNIQUE: Multidetector CT imaging of the abdomen and pelvis was performed using the standard protocol following bolus administration of intravenous contrast. CONTRAST:  112mL OMNIPAQUE IOHEXOL 300 MG/ML  SOLN COMPARISON:  Pelvic radiograph performed 09/21/2018 FINDINGS: Lower chest: A 4 mm nodule is noted at the left lung base (image 6 of 24). The visualized portions of the mediastinum are unremarkable. Hepatobiliary: The liver is unremarkable in appearance. The gallbladder is unremarkable in appearance. The common bile duct remains normal in caliber. Pancreas: The pancreas is within normal limits. Spleen: The spleen is unremarkable in appearance. Adrenals/Urinary Tract: The adrenal glands are unremarkable in appearance. The kidneys are within normal limits. There is no evidence of hydronephrosis. No renal or ureteral stones are identified. Mild nonspecific perinephric stranding is noted bilaterally. Stomach/Bowel: The patient is status post partial right hemicolectomy, with an ileocolic anastomosis. The anastomosis is unremarkable in appearance. The remaining colon is grossly unremarkable. The small bowel is unremarkable in appearance. The stomach is decompressed and unremarkable. Vascular/Lymphatic: Scattered calcification is seen along the abdominal aorta and its branches. The abdominal aorta is otherwise grossly unremarkable. The inferior vena cava is grossly unremarkable. No retroperitoneal lymphadenopathy is seen. No pelvic sidewall lymphadenopathy is identified. Reproductive: The bladder is mildly distended and grossly unremarkable. The prostate remains normal in size, with scattered calcification. Other: Mild chronic postoperative inflammation is noted at the left inguinal region. Musculoskeletal: No acute osseous abnormalities are identified. The left hip joint is unremarkable in appearance. No rib fractures are seen. The patient is status post lumbar spinal fusion at  L1-L3. Decompression is noted at L3-L5. The visualized musculature is unremarkable in appearance. IMPRESSION: 1. No evidence of traumatic injury to the abdomen or pelvis. 2. 4 mm nodule at the left lung base. No follow-up needed if patient is low-risk. Non-contrast chest CT can be considered in 12 months if patient is high-risk. This recommendation follows the consensus statement: Guidelines for Management of Incidental Pulmonary Nodules Detected on CT Images: From the Fleischner Society 2017; Radiology 2017; 284:228-243. Aortic Atherosclerosis (ICD10-I70.0). Electronically Signed   By: Garald Balding M.D.   On: 09/22/2018 00:47   Dg Pelvis Portable  Result Date: 09/21/2018 CLINICAL DATA:  Syncope, fall EXAM: PORTABLE PELVIS 1-2 VIEWS COMPARISON:  None. FINDINGS: Partially visualized hardware in the spine. Surgical changes in the right hemiabdomen. SI joints are non widened. Pubic symphysis and rami are intact. No fracture or malalignment. IMPRESSION: No acute osseous abnormality Electronically Signed   By: Donavan Foil M.D.   On: 09/21/2018 23:29   Dg Chest Port 1 View  Result Date: 09/21/2018 CLINICAL DATA:  Fall, syncope EXAM: PORTABLE CHEST 1 VIEW COMPARISON:  06/30/2013, CT chest 11/01/2013 FINDINGS: Hyperinflation with emphysematous disease and mild fibrosis. No acute consolidation or effusion. Normal cardiomediastinal silhouette with aortic atherosclerosis. No pneumothorax. IMPRESSION: No active disease.  Hyperinflation with emphysematous disease Electronically Signed   By: Donavan Foil M.D.   On: 09/21/2018 23:28   Vas US Carotid  Result Date: 09/22/2018 Carotid Arterial Duplex Study Indications: Syncope. Performing Technologist: June Leap RDMS, RVT  Examination Guidelines: A complete evaluation includes B-mode imaging, spectral Doppler, color Doppler, and power Doppler as needed of all accessible portions of each vessel. Bilateral testing is considered an integral part of a  complete  examination. Limited examinations for reoccurring indications may be performed as noted.  Right Carotid Findings: +----------+--------+--------+--------+------------+--------+           PSV cm/sEDV cm/sStenosisDescribe    Comments +----------+--------+--------+--------+------------+--------+ CCA Prox  60      17                                   +----------+--------+--------+--------+------------+--------+ CCA Distal66      13              heterogenous         +----------+--------+--------+--------+------------+--------+ ICA Prox  47      14      1-39%   heterogenous         +----------+--------+--------+--------+------------+--------+ ICA Distal66      23                                   +----------+--------+--------+--------+------------+--------+ ECA       68      12                                   +----------+--------+--------+--------+------------+--------+ +----------+--------+-------+----------------+-------------------+           PSV cm/sEDV cmsDescribe        Arm Pressure (mmHG) +----------+--------+-------+----------------+-------------------+ Subclavian122            Multiphasic, WNL                    +----------+--------+-------+----------------+-------------------+ +---------+--------+--+--------+-+---------+ VertebralPSV cm/s63EDV cm/s8Antegrade +---------+--------+--+--------+-+---------+  Left Carotid Findings: +----------+--------+--------+--------+------------+--------+           PSV cm/sEDV cm/sStenosisDescribe    Comments +----------+--------+--------+--------+------------+--------+ CCA Prox  87      16                                   +----------+--------+--------+--------+------------+--------+ CCA Distal70      9                                    +----------+--------+--------+--------+------------+--------+ ICA Prox  64      22      1-39%   heterogenous          +----------+--------+--------+--------+------------+--------+ ICA Distal86      19                                   +----------+--------+--------+--------+------------+--------+ ECA       95      13              heterogenous         +----------+--------+--------+--------+------------+--------+ +----------+--------+--------+----------------+-------------------+ SubclavianPSV cm/sEDV cm/sDescribe        Arm Pressure (mmHG) +----------+--------+--------+----------------+-------------------+           215             Multiphasic, WNL                    +----------+--------+--------+----------------+-------------------+ +---------+--------+--+--------+-+---------+ VertebralPSV cm/s41EDV cm/s9Antegrade +---------+--------+--+--------+-+---------+  Summary: Right Carotid: Velocities in the right ICA are consistent with a 1-39% stenosis. Left Carotid: Velocities  in the left ICA are consistent with a 1-39% stenosis.  *See table(s) above for measurements and observations.  Electronically signed by Servando Snare MD on 09/22/2018 at 5:10:30 PM.    Final      Labs:   Basic Metabolic Panel: Recent Labs  Lab 09/21/18 2345 09/21/18 2355  NA 141 142  K 3.9 4.0  CL 106 104  CO2 24  --   GLUCOSE 105* 103*  BUN <5* 8  CREATININE 0.88 0.80  CALCIUM 9.2  --   MG 1.8  --    GFR Estimated Creatinine Clearance: 88.9 mL/min (by C-G formula based on SCr of 0.8 mg/dL). Liver Function Tests: Recent Labs  Lab 09/21/18 2345  AST 21  ALT 14  ALKPHOS 66  BILITOT 0.8  PROT 6.5  ALBUMIN 3.7   No results for input(s): LIPASE, AMYLASE in the last 168 hours. No results for input(s): AMMONIA in the last 168 hours. Coagulation profile Recent Labs  Lab 09/21/18 2345  INR 0.98    CBC: Recent Labs  Lab 09/21/18 2345 09/21/18 2355  WBC 8.5  --   HGB 15.5 16.0  HCT 46.8 47.0  MCV 93.0  --   PLT 213  --    Cardiac Enzymes: Recent Labs  Lab 09/21/18 2345 09/22/18 0832  09/22/18 1256 09/22/18 2005  TROPONINI <0.03 <0.03 <0.03 <0.03   BNP: Invalid input(s): POCBNP CBG: Recent Labs  Lab 09/21/18 2240 09/23/18 0635  GLUCAP 95 83   D-Dimer No results for input(s): DDIMER in the last 72 hours. Hgb A1c No results for input(s): HGBA1C in the last 72 hours. Lipid Profile No results for input(s): CHOL, HDL, LDLCALC, TRIG, CHOLHDL, LDLDIRECT in the last 72 hours. Thyroid function studies Recent Labs    09/22/18 0914  TSH 1.290   Anemia work up No results for input(s): VITAMINB12, FOLATE, FERRITIN, TIBC, IRON, RETICCTPCT in the last 72 hours. Microbiology No results found for this or any previous visit (from the past 240 hour(s)).   Discharge Instructions:   Discharge Instructions    Diet general   Complete by:  As directed    Discharge instructions   Complete by:  As directed    Be sure to drink enough fluid to stay hydrated Wear compression stockings: with 20 to 30 mm pressure support-- will have PCP recheck orthostatic vitals and may need medication if still orthostatic after hydration   Increase activity slowly   Complete by:  As directed      Allergies as of 09/23/2018      Reactions   Acetaminophen    REACTION: tongue swells   Codeine    REACTION: Makes "him mean"   Hydrocodone-acetaminophen    REACTION: nausea, vomiting "makes me deathly sick"   Tetracycline    REACTION: loss of balance   Vicodin [hydrocodone-acetaminophen]       Medication List    TAKE these medications   aspirin EC 325 MG tablet Take 1 tablet (325 mg total) by mouth daily.   nitroGLYCERIN 0.4 MG SL tablet Commonly known as:  NITROSTAT Place 1 tablet (0.4 mg total) under the tongue every 5 (five) minutes as needed for chest pain. Then contact physician and get emergent medical care.   rosuvastatin 40 MG tablet Commonly known as:  CRESTOR Take 0.5 tablets (20 mg total) by mouth daily.   sertraline 50 MG tablet Commonly known as:  ZOLOFT Take 1  tablet (50 mg total) by mouth daily.   traZODone 150 MG tablet  Commonly known as:  DESYREL Take 2 tablets (300 mg total) by mouth at bedtime as needed for sleep.      Follow-up Information    Schedule an appointment as soon as possible for a visit  with Colon Branch, MD.   Specialty:  Internal Medicine Contact information: [address] [place] [address] [phone]        Troy Sine, MD. Schedule an appointment as soon as possible for a visit.   Specialty:  Cardiology Contact information: [address] [place] [address] [phone]            Time coordinating discharge: 25 min  Signed:  Geradine Girt DO  [facility] 09/23/2018, 12:37 PM

## 2018-09-23 NOTE — Care Management Note (Signed)
Case Management Note  Patient Details  Name: Ricardo OELKE Sr. MRN: [id number] Date of Birth: 1946/08/04  Subjective/Objective:     Syncope              Action/Plan: Patient lives at home with Family members; PCP: Colon Branch, MD; has private insurance with [facility] Medicare with prescription drug coverage; pharmacy of choice is [facility]; patient is refusing all [place] services at this time. He use a cane at home as needed. CM will continue to follow for progression of care.  Expected Discharge Date:      Possibly 09/23/2018            Expected Discharge Plan: Home Discharge planning Services  CM Consult  Choice offered to:  Patient  HH Arranged:  Patient Refused HH  Status of Service:  In process, will continue to follow  Sherrilyn Rist [phone] 09/23/2018, 9:45 AM

## 2018-09-23 NOTE — Progress Notes (Signed)
Patient in low bed asleep. Respirations even and unlabored. Bed in lowest position.

## 2018-09-23 NOTE — Progress Notes (Signed)
Patient's orthostatic vital documented.   SBP dropped from 113 to 99.   Patient reported some weakness in legs, but no SOB, CP or Dizziness.

## 2018-09-24 ENCOUNTER — Telehealth: Payer: Self-pay

## 2018-09-25 ENCOUNTER — Telehealth: Payer: Self-pay

## 2018-09-25 NOTE — Telephone Encounter (Signed)
TCM hospital follow up call made to patient. No answer,unable to leave message.

## 2018-10-05 ENCOUNTER — Inpatient Hospital Stay: Payer: Medicare HMO | Admitting: Internal Medicine

## 2018-10-07 ENCOUNTER — Other Ambulatory Visit: Payer: Self-pay | Admitting: Internal Medicine

## 2018-10-07 DIAGNOSIS — E785 Hyperlipidemia, unspecified: Secondary | ICD-10-CM

## 2018-10-07 NOTE — Telephone Encounter (Signed)
TCM call made to patient to schedule for Hospital follow Up appointment. Left message for return call.

## 2018-10-07 NOTE — Telephone Encounter (Signed)
Pt missed hosp f/u scheduled 10/05/2018. Will refill limited supply of Crestor, denied trazodone as he was admitted for fall. Needs appt for refills.

## 2018-10-27 ENCOUNTER — Inpatient Hospital Stay: Payer: Medicare HMO | Admitting: Internal Medicine

## 2018-11-06 ENCOUNTER — Inpatient Hospital Stay: Payer: Medicare HMO | Admitting: Internal Medicine

## 2018-12-22 ENCOUNTER — Ambulatory Visit (INDEPENDENT_AMBULATORY_CARE_PROVIDER_SITE_OTHER): Payer: Medicare HMO | Admitting: Internal Medicine

## 2018-12-22 ENCOUNTER — Other Ambulatory Visit: Payer: Self-pay

## 2018-12-22 DIAGNOSIS — F329 Major depressive disorder, single episode, unspecified: Secondary | ICD-10-CM

## 2018-12-22 DIAGNOSIS — F419 Anxiety disorder, unspecified: Secondary | ICD-10-CM

## 2018-12-22 DIAGNOSIS — E785 Hyperlipidemia, unspecified: Secondary | ICD-10-CM

## 2018-12-22 MED ORDER — TRAZODONE HCL 150 MG PO TABS: 150.0000 mg | ORAL_TABLET | Freq: Every evening | ORAL | 0 refills | Status: AC | PRN

## 2018-12-22 MED ORDER — ROSUVASTATIN CALCIUM 40 MG PO TABS: 20.0000 mg | ORAL_TABLET | Freq: Every day | ORAL | 3 refills | Status: AC

## 2018-12-22 MED ORDER — SERTRALINE HCL 50 MG PO TABS: 50.0000 mg | ORAL_TABLET | Freq: Every day | ORAL | 1 refills | Status: AC

## 2018-12-22 NOTE — Progress Notes (Signed)
Subjective:    Patient ID: Ricardo Palmer Sr., male    DOB: 27-Sep-1945, 73 y.o.   MRN: [id number]  DOS:  12/22/2018 Type of visit - description:  Virtual Visit via Telephone Note  I connected with@ on 12/23/18 at 11:20 AM EDT by telephone and verified that I am speaking with the correct person using two identifiers.  THIS ENCOUNTER IS A VIRTUAL VISIT DUE TO COVID-19 - PATIENT WAS NOT SEEN IN THE OFFICE. PATIENT HAS CONSENTED TO VIRTUAL VISIT / TELEMEDICINE VISIT   Location of patient: home  Location of provider: office  I discussed the limitations, risks, security and privacy concerns of performing an evaluation and management service by telephone and the availability of in person appointments. I also discussed with the patient that there may be a patient responsible charge related to this service. The patient expressed understanding and agreed to proceed.   History of Present Illness: Patient requested a phone call because he is not feeling well. Due to the coronavirus pandemia, he has been very anxious and is not sleeping well. He admits to run out of sertraline about a week ago.  Also, he was admitted with a syncope and released home 09/23/2018. He reports no further episodes    Review of Systems Denies depression per se. No fever chills No cough or difficulty breathing No chest pain or edema. Denies any focal deficits, headaches or facial paresthesias  Past Medical History:  Diagnosis Date  . Colon polyp    three 2-1mm polyps in descending colon, medium sized lipoma in sigmoid colon,  multiple 274mm in recto-sigmoid colon  . COPD, mild ([place])     PFTs 12/2008, PFTs 6-15 minimal dz  . Coronary atherosclerosis of native coronary artery    Multivessel, stent LAD 1998, thrombectomy 2006, PTCA 2011, NSTEMI 6/12 managed medically;  LexiScan Myoview (10/14):  Difficult study, inferior scar with peri-infarct ischemia, inferior HK, EF 45%; low risk  . 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 Depression   . Full dentures   . Headache(784.0)   . Hyperlipidemia   . NSTEMI (non-ST elevated myocardial infarction) ([place]) 02-2011  . Wears glasses     Past Surgical History:  Procedure Laterality Date  . APPENDECTOMY    . BACK SURGERY  1986  . BALLOON ANGIOPLASTY, ARTERY  04/2010   sp PTCA  . [place]   placed in the LAD  . CORONARY STENT PLACEMENT  02/2005   PCI/drug-eluting stent implantation mid-LAD  . INGUINAL HERNIA REPAIR     x3  . LUMBAR FUSION     x5 Dr. Corrin Parker  . LUMBAR LAMINECTOMY  08/09/2013    L1  L2    Dr Luiz Ochoa  . MASS EXCISION Left 03/09/2013   Procedure: EXCISION left chest wall MASS;  Surgeon: Marcello Moores A. Cornett, MD;  Location: [place];  Service: General;  Laterality: Left;  . NASAL FRACTURE SURGERY     x2  . 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 THROMBECTOMY  02/2005   acute non ST segment elevation myocardial infarction  . TONSILLECTOMY      Social History   Socioeconomic History  . Marital status: Widowed    Spouse name: Not on file  . Number of children: 3  . Years of education: Not on file  . Highest education level: Not on  file  Occupational History  . Occupation: retired from the [employer]   Social Needs  . Financial resource strain: Not on file  . Food insecurity:    Worry: Not on file    Inability: Not on file  . Transportation needs:    Medical: Not on file    Non-medical: Not on file  Tobacco Use  . Smoking status: Former Smoker    Packs/day: 0.25    Years: 25.00    Pack years: 6.25    Types: Cigarettes    Last attempt to quit: 07/2016    Years since quitting: 2.4  . Smokeless tobacco: Current User    Types: Chew  . Tobacco comment: quit 07-2016, 1/2 ppd  Substance and Sexual Activity  . Alcohol use: Yes    Alcohol/week: 0.0 standard drinks    Comment: Rarely   . Drug  use: No    Comment:    . Sexual activity: Yes  Lifestyle  . Physical activity:    Days per week: Not on file    Minutes per session: Not on file  . Stress: Not on file  Relationships  . Social connections:    Talks on phone: Not on file    Gets together: Not on file    Attends religious service: Not on file    Active member of club or organization: Not on file    Attends meetings of clubs or organizations: Not on file    Relationship status: Not on file  . Intimate partner violence:    Fear of current or ex partner: Not on file    Emotionally abused: Not on file    Physically abused: Not on file    Forced sexual activity: Not on file  Other Topics Concern  . Not on file  Social History Narrative   Lost 1 son   Household: pt,   g-son Corene Cornea, his wife and child          Allergies as of 12/22/2018      Reactions   Acetaminophen    REACTION: tongue swells   Codeine    REACTION: Makes "him mean"   Hydrocodone-acetaminophen    REACTION: nausea, vomiting "makes me deathly sick"   Tetracycline    REACTION: loss of balance   Vicodin [hydrocodone-acetaminophen]       Medication List       Accurate as of December 22, 2018 11:59 PM. Always use your most recent med list.        aspirin EC 325 MG tablet Take 1 tablet (325 mg total) by mouth daily.   nitroGLYCERIN 0.4 MG SL tablet Commonly known as:  NITROSTAT Place 1 tablet (0.4 mg total) under the tongue every 5 (five) minutes as needed for chest pain. Then contact physician and get emergent medical care.   rosuvastatin 40 MG tablet Commonly known as:  CRESTOR Take 0.5 tablets (20 mg total) by mouth daily.   sertraline 50 MG tablet Commonly known as:  ZOLOFT Take 1 tablet (50 mg total) by mouth daily.   traZODone 150 MG tablet Commonly known as:  DESYREL Take 1-2 tablets (150-300 mg total) by mouth at bedtime as needed for sleep.           Objective:   Physical Exam There were no vitals taken for this visit.  This was a phone visit, he was in no distress, some stuttering noted, at baseline.    Assessment     Assessment prediabetes Hyperlipidemia Pulmonary: -COPD,  last PFTs 2015 minimal disease - Pulmonary nodule: 4 mm, per CT 10/2013.  Seen again on a abdomen CT 09/22/2018 Anxiety -depression- insomnia CAD Last OV w/ cards 06-2013 s/p prior LAD stenting 1998, thrombectomy 2006, PTCA 2011, NSTEMI 02/2011 ([place] 03/04/11: pLAD stent patent, mCFX stent patent, OM1 jailed by previously placed stent with ostial 60%-unchanged, chronically occluded RCA with bridging collaterals filling the distal vessel, EF 60%). Culprit for his NSTEMI at that time was unknown. It was hypothesized that there was thrombus formation that spontaneously resolved with heparin pre-catheterization. He was treated medically. 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 Echo 04/2010: EF 39-03%, grade 1 diastolic dysfunction, mild RVE, mild RAE. Patient lost to followup.  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 Tramadol,didn't  work well for him.Declined referral to pain mngmt 2016     PLAN Anxiety: Reports anxiety triggered by recent  Coronavirus pandemia, also he ran out of sertraline and trazodone about a week ago. Patient is counseled, I will send a refill of sertraline, continue trazodone  1 or 2 tablets at bedtime.  He knows that   will be few days before sertraline kicks in. Will arrange another visit in 3 weeks. Syncope: Admitted for several weeks ago, no further episodes. High cholesterol: Refill Crestor Pulmonary nodule: Noted during the admission few weeks ago, not a new issue. All meds sent to [facility] per patient request, did not like to use the local pharmacy Follow-up: 3 weeks, phone  visit.  I discussed the assessment and treatment plan with the patient. The patient was provided an opportunity to ask questions and all were answered. The patient agreed with the plan and demonstrated an understanding of the instructions.   The patient was advised to call back or seek an in-person evaluation if the symptoms worsen or if the condition fails to improve as anticipated.  I provided 17 minutes of non-face-to-face time during this encounter.   Kathlene November, MD

## 2018-12-23 NOTE — Assessment & Plan Note (Signed)
Anxiety: Reports anxiety triggered by recent  Coronavirus pandemia, also he ran out of sertraline and trazodone about a week ago. Patient is counseled, I will send a refill of sertraline, continue trazodone  1 or 2 tablets at bedtime.  He knows that   will be few days before sertraline kicks in. Will arrange another visit in 3 weeks. Syncope: Admitted for several weeks ago, no further episodes. High cholesterol: Refill Crestor Pulmonary nodule: Noted during the admission few weeks ago, not a new issue. All meds sent to [facility] per patient request, did not like to use the local pharmacy Follow-up: 3 weeks, phone visit.

## 2018-12-25 ENCOUNTER — Telehealth: Payer: Self-pay

## 2018-12-25 NOTE — Telephone Encounter (Signed)
Telephone visit w/ PCP on 12/22/2018, next telephone visit in 3 weeks. LMOM informing Pt to call back to schedule.

## 2019-01-04 ENCOUNTER — Ambulatory Visit: Payer: Medicare HMO | Admitting: Internal Medicine

## 2019-05-14 DIAGNOSIS — R51 Headache: Secondary | ICD-10-CM | POA: Diagnosis not present

## 2019-05-14 DIAGNOSIS — Z888 Allergy status to other drugs, medicaments and biological substances status: Secondary | ICD-10-CM | POA: Diagnosis not present

## 2019-05-14 DIAGNOSIS — I252 Old myocardial infarction: Secondary | ICD-10-CM | POA: Diagnosis not present

## 2019-05-14 DIAGNOSIS — R0602 Shortness of breath: Secondary | ICD-10-CM | POA: Diagnosis not present

## 2019-05-14 DIAGNOSIS — R42 Dizziness and giddiness: Secondary | ICD-10-CM | POA: Diagnosis not present

## 2019-05-14 DIAGNOSIS — J209 Acute bronchitis, unspecified: Secondary | ICD-10-CM | POA: Diagnosis not present

## 2019-05-14 DIAGNOSIS — Z885 Allergy status to narcotic agent status: Secondary | ICD-10-CM | POA: Diagnosis not present

## 2019-08-10 DIAGNOSIS — F99 Mental disorder, not otherwise specified: Secondary | ICD-10-CM | POA: Diagnosis not present

## 2019-08-10 DIAGNOSIS — F329 Major depressive disorder, single episode, unspecified: Secondary | ICD-10-CM | POA: Diagnosis not present

## 2019-08-10 DIAGNOSIS — R45851 Suicidal ideations: Secondary | ICD-10-CM | POA: Diagnosis not present

## 2019-08-10 DIAGNOSIS — F29 Unspecified psychosis not due to a substance or known physiological condition: Secondary | ICD-10-CM | POA: Diagnosis not present

## 2019-08-11 DIAGNOSIS — Z885 Allergy status to narcotic agent status: Secondary | ICD-10-CM | POA: Diagnosis not present

## 2019-08-11 DIAGNOSIS — F332 Major depressive disorder, recurrent severe without psychotic features: Secondary | ICD-10-CM | POA: Diagnosis not present

## 2019-08-11 DIAGNOSIS — Z888 Allergy status to other drugs, medicaments and biological substances status: Secondary | ICD-10-CM | POA: Diagnosis not present

## 2019-08-11 DIAGNOSIS — N179 Acute kidney failure, unspecified: Secondary | ICD-10-CM | POA: Diagnosis not present

## 2019-08-11 DIAGNOSIS — F329 Major depressive disorder, single episode, unspecified: Secondary | ICD-10-CM | POA: Diagnosis not present

## 2019-08-11 DIAGNOSIS — G4700 Insomnia, unspecified: Secondary | ICD-10-CM | POA: Diagnosis not present

## 2019-08-11 DIAGNOSIS — E785 Hyperlipidemia, unspecified: Secondary | ICD-10-CM | POA: Diagnosis not present

## 2019-08-11 DIAGNOSIS — R45851 Suicidal ideations: Secondary | ICD-10-CM | POA: Diagnosis not present

## 2019-08-11 DIAGNOSIS — Z590 Homelessness: Secondary | ICD-10-CM | POA: Diagnosis not present

## 2019-08-11 DIAGNOSIS — I69320 Aphasia following cerebral infarction: Secondary | ICD-10-CM | POA: Diagnosis not present

## 2019-08-11 DIAGNOSIS — Z23 Encounter for immunization: Secondary | ICD-10-CM | POA: Diagnosis not present

## 2019-09-23 DIAGNOSIS — G458 Other transient cerebral ischemic attacks and related syndromes: Secondary | ICD-10-CM | POA: Diagnosis not present

## 2019-09-23 DIAGNOSIS — R413 Other amnesia: Secondary | ICD-10-CM | POA: Diagnosis not present

## 2019-09-23 DIAGNOSIS — E538 Deficiency of other specified B group vitamins: Secondary | ICD-10-CM | POA: Diagnosis not present

## 2020-11-30 ENCOUNTER — Encounter: Payer: Self-pay | Admitting: Internal Medicine

## 2024-01-22 DEATH — deceased
